# Patient Record
Sex: Female | Born: 1990 | Race: White | Hispanic: No | Marital: Single | Smoking: Never smoker
Health system: Southern US, Community
[De-identification: ages and names within clinical notes are randomized; demographics above are authoritative.]

## PROBLEM LIST (undated history)

## (undated) DIAGNOSIS — F909 Attention-deficit hyperactivity disorder, unspecified type: Secondary | ICD-10-CM

## (undated) DIAGNOSIS — U071 COVID-19: Secondary | ICD-10-CM

## (undated) DIAGNOSIS — R51 Headache: Secondary | ICD-10-CM

## (undated) DIAGNOSIS — F329 Major depressive disorder, single episode, unspecified: Secondary | ICD-10-CM

## (undated) DIAGNOSIS — T8859XA Other complications of anesthesia, initial encounter: Secondary | ICD-10-CM

## (undated) DIAGNOSIS — Z8489 Family history of other specified conditions: Secondary | ICD-10-CM

## (undated) DIAGNOSIS — R0600 Dyspnea, unspecified: Secondary | ICD-10-CM

## (undated) DIAGNOSIS — K602 Anal fissure, unspecified: Secondary | ICD-10-CM

## (undated) DIAGNOSIS — D649 Anemia, unspecified: Secondary | ICD-10-CM

## (undated) DIAGNOSIS — J189 Pneumonia, unspecified organism: Secondary | ICD-10-CM

## (undated) DIAGNOSIS — F32A Depression, unspecified: Secondary | ICD-10-CM

## (undated) DIAGNOSIS — T7840XA Allergy, unspecified, initial encounter: Secondary | ICD-10-CM

## (undated) DIAGNOSIS — K8020 Calculus of gallbladder without cholecystitis without obstruction: Secondary | ICD-10-CM

## (undated) DIAGNOSIS — R519 Headache, unspecified: Secondary | ICD-10-CM

## (undated) DIAGNOSIS — E669 Obesity, unspecified: Secondary | ICD-10-CM

## (undated) DIAGNOSIS — G709 Myoneural disorder, unspecified: Secondary | ICD-10-CM

## (undated) DIAGNOSIS — F419 Anxiety disorder, unspecified: Secondary | ICD-10-CM

## (undated) HISTORY — DX: Depression, unspecified: F32.A

## (undated) HISTORY — DX: Anal fissure, unspecified: K60.2

## (undated) HISTORY — DX: Headache, unspecified: R51.9

## (undated) HISTORY — DX: Obesity, unspecified: E66.9

## (undated) HISTORY — DX: Calculus of gallbladder without cholecystitis without obstruction: K80.20

## (undated) HISTORY — DX: Major depressive disorder, single episode, unspecified: F32.9

## (undated) HISTORY — PX: FRACTURE SURGERY: SHX138

## (undated) HISTORY — DX: Allergy, unspecified, initial encounter: T78.40XA

## (undated) HISTORY — PX: NO PAST SURGERIES: SHX2092

## (undated) HISTORY — DX: Anxiety disorder, unspecified: F41.9

## (undated) HISTORY — DX: Headache: R51

---

## 2012-05-12 DIAGNOSIS — L709 Acne, unspecified: Secondary | ICD-10-CM | POA: Insufficient documentation

## 2013-01-30 DIAGNOSIS — Z842 Family history of other diseases of the genitourinary system: Secondary | ICD-10-CM | POA: Insufficient documentation

## 2013-01-30 DIAGNOSIS — L659 Nonscarring hair loss, unspecified: Secondary | ICD-10-CM | POA: Insufficient documentation

## 2013-02-06 DIAGNOSIS — E669 Obesity, unspecified: Secondary | ICD-10-CM | POA: Insufficient documentation

## 2013-07-17 DIAGNOSIS — K649 Unspecified hemorrhoids: Secondary | ICD-10-CM | POA: Insufficient documentation

## 2013-07-17 DIAGNOSIS — L680 Hirsutism: Secondary | ICD-10-CM | POA: Insufficient documentation

## 2016-05-30 DIAGNOSIS — S59902A Unspecified injury of left elbow, initial encounter: Secondary | ICD-10-CM | POA: Insufficient documentation

## 2016-05-30 DIAGNOSIS — Y9389 Activity, other specified: Secondary | ICD-10-CM | POA: Insufficient documentation

## 2016-05-30 DIAGNOSIS — Y999 Unspecified external cause status: Secondary | ICD-10-CM | POA: Diagnosis not present

## 2016-05-30 DIAGNOSIS — X509XXA Other and unspecified overexertion or strenuous movements or postures, initial encounter: Secondary | ICD-10-CM | POA: Insufficient documentation

## 2016-05-30 DIAGNOSIS — Y92009 Unspecified place in unspecified non-institutional (private) residence as the place of occurrence of the external cause: Secondary | ICD-10-CM | POA: Insufficient documentation

## 2016-05-30 DIAGNOSIS — M25522 Pain in left elbow: Secondary | ICD-10-CM | POA: Diagnosis not present

## 2016-05-30 NOTE — ED Triage Notes (Signed)
Pt states that she was using her left arm to reach behind her and felt something pop, pt reports cont pain to the left elbow, pt is able to bend and straighten left arm

## 2016-05-31 ENCOUNTER — Emergency Department
Admission: EM | Admit: 2016-05-31 | Discharge: 2016-05-31 | Disposition: A | Discharge status: Home / Self Care | Payer: 59 | Attending: Emergency Medicine | Admitting: Emergency Medicine

## 2016-05-31 ENCOUNTER — Emergency Department: Payer: 59

## 2016-05-31 DIAGNOSIS — M25522 Pain in left elbow: Secondary | ICD-10-CM | POA: Diagnosis not present

## 2016-05-31 DIAGNOSIS — S59902A Unspecified injury of left elbow, initial encounter: Secondary | ICD-10-CM | POA: Diagnosis not present

## 2016-05-31 MED ORDER — HYDROCODONE-ACETAMINOPHEN 5-325 MG PO TABS: 1.0000 | ORAL_TABLET | Freq: Four times a day (QID) | ORAL | 0 refills | Status: DC | PRN

## 2016-05-31 MED ORDER — HYDROCODONE-ACETAMINOPHEN 5-325 MG PO TABS
1.0000 | ORAL_TABLET | Freq: Once | ORAL | Status: AC
  Administered 2016-05-31: 1 via ORAL
  Filled 2016-05-31: qty 1

## 2016-05-31 MED ORDER — IBUPROFEN 800 MG PO TABS
800.0000 mg | ORAL_TABLET | Freq: Once | ORAL | Status: AC
  Administered 2016-05-31: 800 mg via ORAL
  Filled 2016-05-31: qty 1

## 2016-05-31 MED ORDER — IBUPROFEN 800 MG PO TABS: 800.0000 mg | ORAL_TABLET | Freq: Three times a day (TID) | ORAL | 0 refills | Status: DC | PRN

## 2016-05-31 NOTE — ED Provider Notes (Signed)
[facility] Emergency Department Provider Note   ____________________________________________   First MD Initiated Contact with Patient 05/31/16 [phone]     (approximate)  I have reviewed the triage vital signs and the nursing notes.   HISTORY  Chief Complaint Arm Pain    HPI Amanda Wilson is a 26 y.o. female who presents to the ED from home with a chief complaint of nontraumatic left elbow pain.Patient was using her left, nondominant hand, to reach behind her for a pillow. Had to pull the pillow from underneath some heavy books and heard a pop. Complains of pain to her left elbow, especially on movement. Denies associated extremity weakness, numbness or tingling. Voices no other complaints.   Past medical history None  There are no active problems to display for this patient.   No past surgical history on file.  Prior to Admission medications   Medication Sig Start Date End Date Taking? Authorizing Provider  HYDROcodone-acetaminophen (NORCO) 5-325 MG tablet Take 1 tablet by mouth every 6 (six) hours as needed for moderate pain. 05/31/16   Paulette Blanch, MD  ibuprofen (ADVIL,MOTRIN) 800 MG tablet Take 1 tablet (800 mg total) by mouth every 8 (eight) hours as needed for moderate pain. 05/31/16   Paulette Blanch, MD    Allergies Duricef [cefadroxil] and Latex  No family history on file.  Social History Social History  Substance Use Topics  . Smoking status: Not on file  . Smokeless tobacco: Not on file  . Alcohol use Not on file  Nonsmoker  Review of Systems  Constitutional: No fever/chills. Eyes: No visual changes. ENT: No sore throat. Cardiovascular: Denies chest pain. Respiratory: Denies shortness of breath. Gastrointestinal: No abdominal pain.  No nausea, no vomiting.  No diarrhea.  No constipation. Genitourinary: Negative for dysuria. Musculoskeletal: Positive for left elbow pain. Negative for back pain. Skin: Negative for  rash. Neurological: Negative for headaches, focal weakness or numbness.   ____________________________________________   PHYSICAL EXAM:  VITAL SIGNS: ED Triage Vitals  Enc Vitals Group     BP 05/30/16 2304 127/61     Pulse Rate 05/30/16 2304 89     Resp 05/30/16 2304 18     Temp 05/30/16 2304 98 F (36.7 C)     Temp Source 05/30/16 2304 Oral     SpO2 05/30/16 2304 99 %     Weight 05/30/16 2304 296 lb (134.3 kg)     Height 05/30/16 2304 5\' 4"  (1.626 m)     Head Circumference --      Peak Flow --      Pain Score 05/30/16 2303 7     Pain Loc --      Pain Edu? --      Excl. in [place]? --     Constitutional: Alert and oriented. Well appearing and in no acute distress. Eyes: Conjunctivae are normal. PERRL. EOMI. Head: Atraumatic. Nose: No congestion/rhinnorhea. Mouth/Throat: Mucous membranes are moist.  Oropharynx non-erythematous. Neck: No stridor.   Cardiovascular: Normal rate, regular rhythm. Grossly normal heart sounds.  Good peripheral circulation. Respiratory: Normal respiratory effort.  No retractions. Lungs CTAB. Gastrointestinal: Soft and nontender. No distention. No abdominal bruits. No CVA tenderness. Musculoskeletal:  Left elbow held in adduction and internal rotation. Full range of motion with pain. 2+ radial pulses. Brisk, less than 5 second capillary refill. Neurologic:  Normal speech and language. No gross focal neurologic deficits are appreciated. No gait instability. Skin:  Skin is warm, dry and intact. No rash  noted. Psychiatric: Mood and affect are normal. Speech and behavior are normal.  ____________________________________________   LABS (all labs ordered are listed, but only abnormal results are displayed)  Labs Reviewed - No data to display ____________________________________________  EKG  None ____________________________________________  RADIOLOGY  Left elbow x-rays interpreted per Dr. Radene Knee: No evidence of fracture or  dislocation. ____________________________________________   PROCEDURES  Procedure(s) performed: None  Procedures  Critical Care performed: No  ____________________________________________   INITIAL IMPRESSION / ASSESSMENT AND PLAN / ED COURSE  Pertinent labs & imaging results that were available during my care of the patient were reviewed by me and considered in my medical decision making (see chart for details).  26 year old female who presents with hyperextension injury to left elbow. She does have full range of motion of elbow on exam. Will place in sling, NSAIDs, analgesia and follow-up with orthopedics next week. Strict return precautions given. Patient and mother verbalize understanding and agree with plan of care.      ____________________________________________   FINAL CLINICAL IMPRESSION(S) / ED DIAGNOSES  Final diagnoses:  Left elbow pain      NEW MEDICATIONS STARTED DURING THIS VISIT:  New Prescriptions   HYDROCODONE-ACETAMINOPHEN (NORCO) 5-325 MG TABLET    Take 1 tablet by mouth every 6 (six) hours as needed for moderate pain.   IBUPROFEN (ADVIL,MOTRIN) 800 MG TABLET    Take 1 tablet (800 mg total) by mouth every 8 (eight) hours as needed for moderate pain.     Note:  This document was prepared using Dragon voice recognition software and may include unintentional dictation errors.    Paulette Blanch, MD 05/31/16 [phone]

## 2016-05-31 NOTE — Discharge Instructions (Signed)
1. Wear sling as needed for left elbow comfort. You may remove to bathe and sleep. 2. You may take pain medicines as needed (Motrin/Norco #15). 3. Return to the ER for worsening symptoms, increased swelling, numbness/tingling or other concerns.

## 2016-06-22 ENCOUNTER — Encounter: Payer: Self-pay | Admitting: Nurse Practitioner

## 2016-06-22 ENCOUNTER — Ambulatory Visit (INDEPENDENT_AMBULATORY_CARE_PROVIDER_SITE_OTHER): Payer: 59 | Admitting: Nurse Practitioner

## 2016-06-22 VITALS — BP 109/72 | HR 80 | Temp 98.3°F | Resp 16 | Ht 62.75 in | Wt 295.0 lb

## 2016-06-22 DIAGNOSIS — N946 Dysmenorrhea, unspecified: Secondary | ICD-10-CM | POA: Diagnosis not present

## 2016-06-22 DIAGNOSIS — F329 Major depressive disorder, single episode, unspecified: Secondary | ICD-10-CM

## 2016-06-22 DIAGNOSIS — F32A Depression, unspecified: Secondary | ICD-10-CM

## 2016-06-22 DIAGNOSIS — Z30011 Encounter for initial prescription of contraceptive pills: Secondary | ICD-10-CM | POA: Diagnosis not present

## 2016-06-22 DIAGNOSIS — F419 Anxiety disorder, unspecified: Secondary | ICD-10-CM | POA: Diagnosis not present

## 2016-06-22 MED ORDER — NORETHINDRONE ACET-ETHINYL EST 1-20 MG-MCG PO TABS: 1.0000 | ORAL_TABLET | Freq: Every day | ORAL | 11 refills | Status: DC

## 2016-06-22 MED ORDER — VENLAFAXINE HCL 37.5 MG PO TABS: 37.5000 mg | ORAL_TABLET | Freq: Two times a day (BID) | ORAL | 1 refills | Status: DC

## 2016-06-22 NOTE — Patient Instructions (Signed)
Amanda Wilson, Thank you for coming in to clinic today.  1. For your anxiety and depression: - START venlafaxine 37.5 mg twice daily.  For the first week only take once daily.  Day 8, start your twice daily dosing.  2. For your heavy periods: - Start your pill on the first Sunday of your next period - Day 1 of Bleeding. - Allow 2 full cycles for reduction of symptoms.   Please schedule a follow-up appointment with [name], AGNP to Return in about 4 weeks (around 07/20/2016) for anxiety/depression.  If you have any other questions or concerns, please feel free to call the clinic or send a message through [facility]. You may also schedule an earlier appointment if necessary.  [name], DNP, AGNP-BC Adult Gerontology Nurse Practitioner [place]

## 2016-06-22 NOTE — Progress Notes (Signed)
Subjective:    Patient ID: Amanda Wilson, female    DOB: [birth year], 26 y.o.   MRN: [id number]  Amanda Wilson is a 26 y.o. female presenting on 06/22/2016 for New Patient (Initial Visit) (Patient here today to establish care, pt reports that she has not had a PCP in several years. Patient reports D/C Lexapro for anxiety/depression about 6 months ago. )   HPI  Depression When in [place] for Master's program, restarted on Lexapro 30 mg once daily.  Depression worse on medication and thought she was having hormone problems (GYN).  Was having daily headaches.  Began feeling so bad she was missing class, not getting out of bed.  She has since tapered off Lexapro and is not currently taking anything.  Lexapro was making her feel "loopy, spacey, and out of it."  She had taken lexapro since 26 yo and was regularly having to increase/decrease her dose.  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  When she resumes school, she plans to attend locally.  Is looking for psychiatrist.  Has called [facility] and is not getting through.  She has been seeing clinical psychologist Dr. Burt Knack in downtown [place] since February.  Not working, staying home and keeping busy at home with some home improvement projects, cooking for her family.   Her depression was worse once home, but is improving. Now anxiety is "worse than ever."  Last week 4 days in a row stopped eating completely.  "Head would not shut up" r/t racing thoughts.  Mother had to schedule this appointment for her.  She has panic attacks described as chest pain and "feeling like [she] would explode."  She has tried to find a job, but cannot r/t anxiety associated w/ starting work.  Last job she had to quit r/t nausea before starting shift.  Diet: has been doing weight watchers since home with her mother and brother and has lost 17 lbs. Exercise: daily 45 min walks  w/ her dog.  Contraception Management More regular periods. Previously q 6 months, now every 29-30 days. LMP 05/28/2016.   - Not currently sexually active.  She needs new OCP for irregular period history.  She states she has not been fully evaluated for PCOS, but other providers (including Endocrinology) had suggested it.  These providers were in [place] at [facility].  Depression screen PHQ 2/9 06/22/2016  Decreased Interest 3  Down, Depressed, Hopeless 3  PHQ - 2 Score 6  Altered sleeping 2  Tired, decreased energy 2  Change in appetite 1  Feeling bad or failure about yourself  2  Trouble concentrating 1  Moving slowly or fidgety/restless 0  Suicidal thoughts 1  PHQ-9 Score 15  Difficult doing work/chores Extremely dIfficult   GAD 7 : Generalized Anxiety Score 06/22/2016  Nervous, Anxious, on Edge 3  Control/stop worrying 3  Worry too much - different things 3  Trouble relaxing 2  Restless 1  Easily annoyed or irritable 1  Afraid - awful might happen 3  Total GAD 7 Score 16  Anxiety Difficulty Extremely difficult      Social History  Substance Use Topics  . Smoking status: Never Smoker  . Smokeless tobacco: Never Used  . Alcohol use 0.6 oz/week    1 Glasses of wine per week     Comment: 1 binge drinking episode per month w/ 5 drinks    Review of Systems  Constitutional:  Positive for appetite change.       Swings high/low appetite w/ moods  HENT: Negative.   Eyes: Negative.   Respiratory: Positive for chest tightness and shortness of breath.   Cardiovascular: Negative.   Gastrointestinal: Negative.   Endocrine: Negative.   Genitourinary: Positive for menstrual problem.  Musculoskeletal: Negative.   Skin: Negative.   Allergic/Immunologic: Negative.   Neurological: Positive for headaches.  Hematological: Negative.   Psychiatric/Behavioral: Negative for self-injury and suicidal ideas. The patient is nervous/anxious.    Per HPI unless specifically indicated above      Objective:    BP 109/72 (BP Location: Left Arm, Patient Position: Sitting, Cuff Size: Large)   Pulse 80   Temp 98.3 F (36.8 C) (Oral)   Resp 16   Ht 5' 2.75" (1.594 m)   Wt 295 lb (133.8 kg)   LMP 05/28/2016 (Exact Date)   SpO2 100%   BMI 52.67 kg/m   Wt Readings from Last 3 Encounters:  06/22/16 295 lb (133.8 kg)  05/30/16 296 lb (134.3 kg)    Physical Exam  Constitutional: She is oriented to person, place, and time. She appears well-developed and well-nourished. No distress.  obese  HENT:  Head: Normocephalic and atraumatic.  Neck: Normal range of motion. Neck supple. No JVD present. No tracheal deviation present. No thyromegaly present.  Cardiovascular: Normal rate, regular rhythm, normal heart sounds and intact distal pulses.   Pulmonary/Chest: Effort normal and breath sounds normal. No respiratory distress.  Abdominal: Soft. Bowel sounds are normal.  Lymphadenopathy:    She has no cervical adenopathy.  Neurological: She is alert and oriented to person, place, and time.  Skin: Skin is warm and dry.  Psychiatric: She has a normal mood and affect. Her behavior is normal. Judgment and thought content normal.  Vitals reviewed.  No results found for this or any previous visit.    Assessment & Plan:   Problem List Items Addressed This Visit      Genitourinary   Dysmenorrhea    Pt w/ history of dysmenorrhea.  Currently off OCP.  Previously used cyclic OCP.  Not currently sexually active and LMP 05/28/2016.  Plan: 1. Start norethindrone-ethinyl estradiol 1-20 mg-mcg take one active pill for 21 days, continue pack through day 28, then start new pack. 2. Consider evaluation of PCOS in future.      Relevant Medications   norethindrone-ethinyl estradiol (MICROGESTIN,JUNEL,LOESTRIN) 1-20 MG-MCG tablet     Other   Anxiety and depression - Primary    Improving depression, worsening anxiety. Pt with high health literacy level for mental health.  Connected with weekly  psychologic counseling.  Desires Psychiatry management.  Plan: 1. Referral to [facility]. 2. Start SNRI venlafaxine 37.5 mg once daily.  Prior SSRI Lexapro without complete remission. 3. Continue meaningful activities at home in absence of job. 4. Continue eating well and exercising regularly.   5. Follow up 4 weeks.      Relevant Medications   venlafaxine (EFFEXOR) 37.5 MG tablet   Other Relevant Orders   Ambulatory referral to Psychiatry    Other Visit Diagnoses    Encounter for initial prescription of contraceptive pills       Start OCP for dysmenorrhea and possible PCOS.  Plan: see dysmenorrhea      Meds ordered this encounter  Medications  . Multiple Vitamin (MULTIVITAMIN) tablet    Sig: Take 1 tablet by mouth daily.  Marland Kitchen venlafaxine (EFFEXOR) 37.5 MG tablet    Sig: Take 1 tablet (37.5 mg  total) by mouth 2 (two) times daily with a meal.    Dispense:  60 tablet    Refill:  1    Order Specific Question:   Supervising Provider    Answer:   Olin Hauser [[id number]]  . norethindrone-ethinyl estradiol (MICROGESTIN,JUNEL,LOESTRIN) 1-20 MG-MCG tablet    Sig: Take 1 tablet by mouth daily.    Dispense:  1 Package    Refill:  11    Order Specific Question:   Supervising Provider    Answer:   Olin Hauser [[id number]]      Follow up plan: Return in about 4 weeks (around 07/20/2016) for anxiety/depression.   [name], DNP, AGPCNP-BC Adult Gerontology Primary Care Nurse Practitioner [facility] 06/24/2016, 9:06 PM

## 2016-06-24 ENCOUNTER — Encounter: Payer: Self-pay | Admitting: Nurse Practitioner

## 2016-06-24 DIAGNOSIS — F32A Depression, unspecified: Secondary | ICD-10-CM | POA: Insufficient documentation

## 2016-06-24 DIAGNOSIS — F419 Anxiety disorder, unspecified: Principal | ICD-10-CM

## 2016-06-24 DIAGNOSIS — N946 Dysmenorrhea, unspecified: Secondary | ICD-10-CM | POA: Insufficient documentation

## 2016-06-24 DIAGNOSIS — F329 Major depressive disorder, single episode, unspecified: Secondary | ICD-10-CM | POA: Insufficient documentation

## 2016-06-24 NOTE — Progress Notes (Signed)
I have reviewed this encounter including the documentation in this note and/or discussed this patient with the provider, [name], AGPCNP-BC. I am certifying that I agree with the content of this note as supervising physician.  Nobie Putnam, [facility] 06/24/2016, 9:46 PM

## 2016-06-24 NOTE — Assessment & Plan Note (Addendum)
Pt w/ history of dysmenorrhea.  Currently off OCP.  Previously used cyclic OCP.  Not currently sexually active and LMP 05/28/2016.  Plan: 1. Start norethindrone-ethinyl estradiol 1-20 mg-mcg take one active pill for 21 days, continue pack through day 28, then start new pack. 2. Consider evaluation of PCOS in future.

## 2016-06-24 NOTE — Assessment & Plan Note (Signed)
Improving depression, worsening anxiety. Pt with high health literacy level for mental health.  Connected with weekly psychologic counseling.  Desires Psychiatry management.  Plan: 1. Referral to [facility]. 2. Start SNRI venlafaxine 37.5 mg once daily.  Prior SSRI Lexapro without complete remission. 3. Continue meaningful activities at home in absence of job. 4. Continue eating well and exercising regularly.   5. Follow up 4 weeks.

## 2016-07-16 ENCOUNTER — Ambulatory Visit: Payer: 59 | Admitting: Psychiatry

## 2016-07-29 ENCOUNTER — Ambulatory Visit (INDEPENDENT_AMBULATORY_CARE_PROVIDER_SITE_OTHER): Payer: 59 | Admitting: Nurse Practitioner

## 2016-07-29 DIAGNOSIS — F329 Major depressive disorder, single episode, unspecified: Secondary | ICD-10-CM

## 2016-07-29 DIAGNOSIS — F419 Anxiety disorder, unspecified: Secondary | ICD-10-CM | POA: Diagnosis not present

## 2016-07-29 DIAGNOSIS — F32A Depression, unspecified: Secondary | ICD-10-CM

## 2016-07-29 MED ORDER — VENLAFAXINE HCL 50 MG PO TABS: 50.0000 mg | ORAL_TABLET | Freq: Two times a day (BID) | ORAL | 0 refills | Status: DC

## 2016-07-29 MED ORDER — BUSPIRONE HCL 5 MG PO TABS: 5.0000 mg | ORAL_TABLET | Freq: Two times a day (BID) | ORAL | 1 refills | Status: DC

## 2016-07-29 NOTE — Progress Notes (Signed)
Subjective:    Patient ID: Amanda Wilson, female    DOB: Oct 26, 1990, 26 y.o.   MRN: [id number]  Amanda Wilson is a 26 y.o. female presenting on 07/29/2016 for Anxiety   HPI  Anxiety and depression Has had less depressive symptoms (sleepiness improved, less anhedonia, has resumed painting w/ intention to try, has started applying for jobs.  Having more energy and is ready to start doing things.  Family is now noticing improved mood. Still having high anxiety and full panic attacks (can't breathe, gagging, chest pain).    Prescribed Effexor 37.5 mg bid.  Pt is taking Effexor 37.5 mg each evening, but only taking daytime dose 3x per week 2/2 no appetite and nausea if taken w/o food.  No nausea when taking after large evening meal. Pt is using weight watchers for weight loss.  Now only eating one meal per day. - Usually 25 points at that meal. Sometimes will get small snack in am and still will have nausea in am dose.  Cut back on drinking - to almost none, but notes two occurrences of 3-6 drinks per occasion.  On one occurrence over 6 hours had 3  24 oz beers w/ drunkenness when she "wouldn't have normally been drunk or hungover."  Next day had nausea w/ vomiting q 30 minutes.   Nearly passed out. Had never had this reaction prior (nausea, vomiting, tremor for at least 15 hours after drinking).  C/w serotonin syndrome.  Normally withdrawn mood when depressed, but now that depression is improving and she is not withdrawing from family, she notices she will get angry easily.  Has scheduled w/ psychiatry, but has not had her first appointment.  GAD 7 : Generalized Anxiety Score 07/29/2016 06/22/2016  Nervous, Anxious, on Edge 2 3  Control/stop worrying 2 3  Worry too much - different things 3 3  Trouble relaxing 1 2  Restless 2 1  Easily annoyed or irritable 2 1  Afraid - awful might happen 2 3  Total GAD 7 Score 14 16  Anxiety Difficulty Extremely difficult Extremely difficult     Depression screen [facility] 2/9 07/29/2016 06/22/2016  Decreased Interest 1 3  Down, Depressed, Hopeless 1 3  PHQ - 2 Score 2 6  Altered sleeping 2 2  Tired, decreased energy 1 2  Change in appetite 2 1  Feeling bad or failure about yourself  1 2  Trouble concentrating 0 1  Moving slowly or fidgety/restless 0 0  Suicidal thoughts 0 1  PHQ-9 Score 8 15  Difficult doing work/chores - Extremely dIfficult     Social History  Substance Use Topics  . Smoking status: Never Smoker  . Smokeless tobacco: Never Used  . Alcohol use 0.6 oz/week    1 Glasses of wine per week     Comment: 1 binge drinking episode per month w/ 5 drinks    Review of Systems Per HPI unless specifically indicated above     Objective:    BP 136/80 (BP Location: Left Arm, Patient Position: Sitting, Cuff Size: Large)   Pulse 89   Temp 98.6 F (37 C) (Oral)   Resp 16   Ht 5\' 3"  (1.6 m)   Wt 282 lb 9.6 oz (128.2 kg)   LMP 07/19/2016 (Exact Date)   SpO2 99%   BMI 50.06 kg/m    Wt Readings from Last 3 Encounters:  07/29/16 282 lb 9.6 oz (128.2 kg)  06/22/16 295 lb (133.8 kg)  05/30/16 296 lb (134.3 kg)  Physical Exam  General - obese, well-appearing, NAD HEENT - Normocephalic, atraumatic Neck - supple, non-tender, no LAD Heart - RRR, bradycardia, no murmurs heard Lungs - Clear throughout all lobes, no wheezing, crackles, or rhonchi. Normal work of breathing. Abdomen - soft, NTND, no masses, no hepatosplenomegaly, active bowel sounds Extremeties - non-tender, no edema, cap refill < 2 seconds, peripheral pulses intact +2 bilaterally Skin - warm, dry, no rashes Neuro - awake, alert, oriented x3, intact muscle strength 5/5 bilaterally, intact distal sensation to light touch, normal coordination, normal gait Psych - Normal mood and affect, normal behavior     No results found for this or any previous visit.    Assessment & Plan:   Problem List Items Addressed This Visit      Other   Anxiety and  depression    Improved depression w/ pt noticing some improvement as well.  Only slightly improved anxiety w/ improvement of 2 points on GAD7 tool. Pt with high health literacy level for mental health.  Connected with weekly psychologic counseling.  Pt has gotten her psychiatry appointment scheduled, but has not had it yet.  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    Plan: 1. Referral to [facility]. 2. Change dose of SNRI venlafaxine from 37.5 mg bid to 50 mg once daily.   3. START buspirone 5 mg bid.  Can increase dose at future appointments as needed.  4.Continue meaningful activities at home in absence of job.  Continue work toward finding employment. 5. Continue eating well and exercising regularly.   6. Reviewed s/sx of serotonin syndrome.  Advised pt to avoid all alcohol use.  7. Follow up 4 weeks.      Relevant Medications   venlafaxine (EFFEXOR) 50 MG tablet   Other Relevant Orders   EKG 12-Lead      Meds ordered this encounter  Medications  . busPIRone (BUSPAR) 5 MG tablet    Sig: Take 1 tablet (5 mg total) by mouth 2 (two) times daily.    Dispense:  60 tablet    Refill:  1    Order Specific Question:   Supervising Provider    Answer:   Olin Hauser [[id number]]  . venlafaxine (EFFEXOR) 50 MG tablet    Sig: Take 1 tablet (50 mg total) by mouth daily.    Dispense:  30 tablet    Refill:  0    Order Specific Question:   Supervising Provider    Answer:   Olin Hauser [[id number]]      Follow up plan: Return in about 4 weeks (around 08/26/2016) for anxiety and depression.  [name], DNP, AGPCNP-BC Adult Gerontology Primary Care Nurse Practitioner [facility] 08/01/2016, 10:05 PM

## 2016-07-29 NOTE — Patient Instructions (Addendum)
Amanda Wilson, Thank you for coming in to clinic today.  1. For your anxiety: - START buspirone 5 mg once daily x 1 week.  Increase to 5 mg twice daily after 1 week. After 2-3 weeks if you feel you need a higher dose, call clinic.   2. For your anxiety and depression: - Change your Effexor to 50 mg once daily.  Take one tablet of your new prescription once per day.  Until you get the new bottle, take 1 and 1/2 pills once per day.   - AVOID ALL ALCOHOL.  Please schedule a follow-up appointment with [name], AGNP to Return in about 4 weeks (around 08/26/2016) for anxiety and depression.  If you have any other questions or concerns, please feel free to call the clinic or send a message through [facility]. You may also schedule an earlier appointment if necessary.  [name], DNP, AGNP-BC Adult Gerontology Nurse Practitioner [place]

## 2016-08-01 MED ORDER — VENLAFAXINE HCL 50 MG PO TABS: 50.0000 mg | ORAL_TABLET | Freq: Every day | ORAL | 0 refills | Status: DC

## 2016-08-01 NOTE — Assessment & Plan Note (Signed)
Improved depression w/ pt noticing some improvement as well.  Only slightly improved anxiety w/ improvement of 2 points on GAD7 tool. Pt with high health literacy level for mental health.  Connected with weekly psychologic counseling.  Pt has gotten her psychiatry appointment scheduled, but has not had it yet.  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    Plan: 1. Referral to [facility]. 2. Change dose of SNRI venlafaxine from 37.5 mg bid to 50 mg once daily.   3. START buspirone 5 mg bid.  Can increase dose at future appointments as needed.  4.Continue meaningful activities at home in absence of job.  Continue work toward finding employment. 5. Continue eating well and exercising regularly.   6. Reviewed s/sx of serotonin syndrome.  Advised pt to avoid all alcohol use.  7. Follow up 4 weeks.

## 2016-08-02 NOTE — Progress Notes (Signed)
I have reviewed this encounter including the documentation in this note and/or discussed this patient with the provider, [name], AGPCNP-BC. I am certifying that I agree with the content of this note as supervising physician.  Nobie Putnam, [facility] 08/02/2016, 5:17 PM

## 2016-08-21 ENCOUNTER — Other Ambulatory Visit: Payer: Self-pay | Admitting: Nurse Practitioner

## 2016-08-21 DIAGNOSIS — F419 Anxiety disorder, unspecified: Principal | ICD-10-CM

## 2016-08-21 DIAGNOSIS — F32A Depression, unspecified: Secondary | ICD-10-CM

## 2016-08-21 DIAGNOSIS — F329 Major depressive disorder, single episode, unspecified: Secondary | ICD-10-CM

## 2016-08-26 ENCOUNTER — Ambulatory Visit: Payer: Self-pay | Admitting: Nurse Practitioner

## 2016-08-31 ENCOUNTER — Encounter: Payer: Self-pay | Admitting: Nurse Practitioner

## 2016-08-31 ENCOUNTER — Ambulatory Visit (INDEPENDENT_AMBULATORY_CARE_PROVIDER_SITE_OTHER): Payer: 59 | Admitting: Nurse Practitioner

## 2016-08-31 DIAGNOSIS — F329 Major depressive disorder, single episode, unspecified: Secondary | ICD-10-CM | POA: Diagnosis not present

## 2016-08-31 DIAGNOSIS — F419 Anxiety disorder, unspecified: Secondary | ICD-10-CM

## 2016-08-31 DIAGNOSIS — F32A Depression, unspecified: Secondary | ICD-10-CM

## 2016-08-31 MED ORDER — VENLAFAXINE HCL 25 MG PO TABS: 25.0000 mg | ORAL_TABLET | Freq: Two times a day (BID) | ORAL | 5 refills | Status: DC

## 2016-08-31 MED ORDER — BUSPIRONE HCL 7.5 MG PO TABS: 7.5000 mg | ORAL_TABLET | Freq: Two times a day (BID) | ORAL | 5 refills | Status: DC

## 2016-08-31 NOTE — Progress Notes (Signed)
Subjective:    Patient ID: Amanda Wilson, female    DOB: 11-03-1990, 26 y.o.   MRN: [id number]  Amanda Wilson is a 26 y.o. female presenting on 08/31/2016 for Anxiety   HPI Anxiety Feels like she is "spiraling out of control." Has really tried to be healthy, but has had recent relapses in diet and physical activity.  Finds herself sleeping a lot - avoidance and fatigue.Struggling w/ relationship stress "toxic."  States she cares too much and doesn't want to distance.  Mood plummets when around him.  Feels out of control over last 2-3 weeks.  Long term 3 years of off and on relationship.  Has trouble coping w/ romantic/friend relationship and notes this is r/t more situational difficulty. - has attempted stopping communication completely - has attempted setting boundaries Feels failure in these attempts because of increased anxiety after attempting to end this relationship.  Has a counselor - Would like to see psychiatrist.  Counseling - Dr. Phoebe Perch, Ph.D. - had been seeing him before going to [place]: Pt feels she has no true social support for this issue despite having good family support, good friends.   - No current spiritual/faith-based support system.  Has previously had active [employer] faith support system.  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  GAD 7 : Generalized Anxiety Score 08/31/2016 07/29/2016 06/22/2016  Nervous, Anxious, on Edge 1 2 3   Control/stop worrying 1 2 3   Worry too much - different things 2 3 3   Trouble relaxing 1 1 2   Restless 0 2 1  Easily annoyed or irritable 1 2 1   Afraid - awful might happen 2 2 3   Total GAD 7 Score 8 14 16   Anxiety Difficulty Somewhat difficult Extremely difficult Extremely difficult    Depression screen [facility] 2/9 08/31/2016 07/29/2016 06/22/2016  Decreased Interest 1 1 3   Down, Depressed, Hopeless 2 1 3   PHQ - 2 Score 3 2 6   Altered sleeping 2 2 2   Tired,  decreased energy 1 1 2   Change in appetite 0 2 1  Feeling bad or failure about yourself  2 1 2   Trouble concentrating 0 0 1  Moving slowly or fidgety/restless 0 0 0  Suicidal thoughts 2 0 1  PHQ-9 Score 10 8 15   Difficult doing work/chores Very difficult - Extremely dIfficult    Social History  Substance Use Topics  . Smoking status: Never Smoker  . Smokeless tobacco: Never Used  . Alcohol use 0.6 oz/week    1 Glasses of wine per week     Comment: 1 binge drinking episode per month w/ 5 drinks    Review of Systems Per HPI unless specifically indicated above     Objective:    BP 114/61 (BP Location: Right Arm, Patient Position: Sitting, Cuff Size: Large)   Pulse 76   Temp 98.4 F (36.9 C) (Oral)   Ht 5\' 4"  (1.626 m)   Wt 278 lb 12.8 oz (126.5 kg)   BMI 47.86 kg/m   Wt Readings from Last 3 Encounters:  08/31/16 278 lb 12.8 oz (126.5 kg)  07/29/16 282 lb 9.6 oz (128.2 kg)  06/22/16 295 lb (133.8 kg)    Physical Exam  General - morbidly obese, well-appearing, NAD HEENT - Normocephalic, atraumatic Neck - supple, non-tender, no LAD Heart - RRR, no murmurs heard Lungs - Clear throughout all lobes, no wheezing, crackles, or rhonchi. Normal work of breathing. Extremeties -  non-tender, no edema, cap refill < 2 seconds, peripheral pulses intact +2 bilaterally Skin - warm, dry Neuro - awake, alert, oriented x3, normal gait Psych - anxious mood and affect, tearfulness occasionally during visit.  Otherwise normal behavior with appropriate conversation and eye contact.    No results found for this or any previous visit.    Assessment & Plan:   Problem List Items Addressed This Visit      Other   Anxiety and depression    Continued improvement overall w/ reduced GAD7 score after starting buspirone.  Situational depression worsened r/t relationship stress.  Pt continues w/ counseling but is finding the relationship less therapeutic.  Still desires psychiatry.  Improved  venlafaxine tolerability with 50 mg once daily.    Plan: 1. Referral to [facility].  Consider counseling through this office. Can provide alternative resources if needed for changing counselor in future. 2. Change dose of SNRI venlafaxine from 25 mg bid.  If return of nausea, return to 50 mg once daily dosing. 3. INCREASE buspirone to 7.5 mg bid.  Can increase dose at future appointments as needed.  4. Continue meaningful activities at home.  Encouraged keeping employment.  Encouraged utilizing all support systems of family, friends, faith-based. 5. Continue eating well and exercising regularly.   6. Follow up 4 weeks.      Relevant Medications   venlafaxine (EFFEXOR) 25 MG tablet   Other Relevant Orders   Ambulatory referral to Psychiatry      Meds ordered this encounter  Medications  . venlafaxine (EFFEXOR) 25 MG tablet    Sig: Take 1 tablet (25 mg total) by mouth 2 (two) times daily with a meal.    Dispense:  60 tablet    Refill:  5    Order Specific Question:   Supervising Provider    Answer:   Olin Hauser [[id number]]  . busPIRone (BUSPAR) 7.5 MG tablet    Sig: Take 1 tablet (7.5 mg total) by mouth 2 (two) times daily.    Dispense:  60 tablet    Refill:  5    Order Specific Question:   Supervising Provider    Answer:   Olin Hauser [[id number]]      Follow up plan: Return in about 4 weeks (around 09/28/2016) for anxiety and depression.  A total of 40 minutes was spent face-to-face with this patient. Greater than 50% of this time was spent in counseling and coordination of care with the patient.  [name], DNP, AGPCNP-BC Adult Gerontology Primary Care Nurse Practitioner [facility] 08/31/2016, 5:09 PM

## 2016-08-31 NOTE — Patient Instructions (Addendum)
Amanda Wilson, Thank you for coming in to clinic today.  1. Psychiatry: [facility] [place] [name], [place] [address] Phone: [phone]   2. Encourage continued family and friends support. - Work on setting and keeping boundaries.  Find a different source for fulfillment to move on from your negative situation. - Encourage faith/religion support - community or independent engagement with your faith will provide another support.   Please schedule a follow-up appointment with [name], AGNP. Return in about 4 weeks (around 09/28/2016) for anxiety and depression.    If you have any other questions or concerns, please feel free to call the clinic or send a message through [place]. You may also schedule an earlier appointment if necessary.  You will receive a survey after today's visit either digitally by e-mail or paper by [employer]. Your experiences and feedback matter to [place].  Please respond so we know how we are doing as we provide care for you.   [name], DNP, AGNP-BC Adult Gerontology Nurse Practitioner [place]

## 2016-09-01 NOTE — Progress Notes (Signed)
I have reviewed this encounter including the documentation in this note and/or discussed this patient with the provider, [name], AGPCNP-BC. I am certifying that I agree with the content of this note as supervising physician.  Nobie Putnam, [facility] 09/01/2016, 12:30 PM

## 2016-09-01 NOTE — Assessment & Plan Note (Signed)
Continued improvement overall w/ reduced GAD7 score after starting buspirone.  Situational depression worsened r/t relationship stress.  Pt continues w/ counseling but is finding the relationship less therapeutic.  Still desires psychiatry.  Improved venlafaxine tolerability with 50 mg once daily.    Plan: 1. Referral to [facility].  Consider counseling through this office. Can provide alternative resources if needed for changing counselor in future. 2. Change dose of SNRI venlafaxine from 25 mg bid.  If return of nausea, return to 50 mg once daily dosing. 3. INCREASE buspirone to 7.5 mg bid.  Can increase dose at future appointments as needed.  4. Continue meaningful activities at home.  Encouraged keeping employment.  Encouraged utilizing all support systems of family, friends, faith-based. 5. Continue eating well and exercising regularly.   6. Follow up 4 weeks.

## 2016-09-23 ENCOUNTER — Ambulatory Visit (INDEPENDENT_AMBULATORY_CARE_PROVIDER_SITE_OTHER): Payer: 59 | Admitting: Psychiatry

## 2016-09-23 ENCOUNTER — Encounter (HOSPITAL_COMMUNITY): Payer: Self-pay | Admitting: Psychiatry

## 2016-09-23 VITALS — BP 140/80 | HR 81 | Ht 64.0 in | Wt 280.0 lb

## 2016-09-23 DIAGNOSIS — Z810 Family history of intellectual disabilities: Secondary | ICD-10-CM | POA: Diagnosis not present

## 2016-09-23 DIAGNOSIS — F332 Major depressive disorder, recurrent severe without psychotic features: Secondary | ICD-10-CM

## 2016-09-23 DIAGNOSIS — Z818 Family history of other mental and behavioral disorders: Secondary | ICD-10-CM | POA: Diagnosis not present

## 2016-09-23 DIAGNOSIS — Z658 Other specified problems related to psychosocial circumstances: Secondary | ICD-10-CM | POA: Diagnosis not present

## 2016-09-23 DIAGNOSIS — Z739 Problem related to life management difficulty, unspecified: Secondary | ICD-10-CM

## 2016-09-23 MED ORDER — FLUOXETINE HCL 20 MG PO CAPS: 20.0000 mg | ORAL_CAPSULE | Freq: Every day | ORAL | 1 refills | Status: DC

## 2016-09-23 MED ORDER — HYDROXYZINE PAMOATE 25 MG PO CAPS: 25.0000 mg | ORAL_CAPSULE | Freq: Three times a day (TID) | ORAL | 1 refills | Status: DC | PRN

## 2016-09-23 NOTE — Patient Instructions (Signed)
START prozac 20 mg daily, increase to 40 mg in 2 week  Use Vistaril 1-2 tablets for anxiety or sleep   STOP effexor and buspar
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
## 2016-09-28 ENCOUNTER — Encounter: Payer: Self-pay | Admitting: Nurse Practitioner

## 2016-09-28 ENCOUNTER — Ambulatory Visit (INDEPENDENT_AMBULATORY_CARE_PROVIDER_SITE_OTHER): Payer: 59 | Admitting: Nurse Practitioner

## 2016-09-28 VITALS — BP 111/56 | HR 70 | Temp 97.4°F | Ht 64.0 in | Wt 282.0 lb

## 2016-09-28 DIAGNOSIS — F329 Major depressive disorder, single episode, unspecified: Secondary | ICD-10-CM

## 2016-09-28 DIAGNOSIS — F419 Anxiety disorder, unspecified: Secondary | ICD-10-CM | POA: Diagnosis not present

## 2016-09-28 DIAGNOSIS — F32A Depression, unspecified: Secondary | ICD-10-CM

## 2016-09-28 NOTE — Patient Instructions (Signed)
Britton, Thank you for coming in to clinic today.  1. Continue management w/ psychiatry.  Please schedule a follow-up appointment with [name], AGNP. Return in about 3 months (around 12/28/2016) for anxiety and depression - can move to 6 months if continues w/ psychiatry.  If you have any other questions or concerns, please feel free to call the clinic or send a message through [place]. You may also schedule an earlier appointment if necessary.  You will receive a survey after today's visit either digitally by e-mail or paper by [employer]. Your experiences and feedback matter to [place].  Please respond so we know how we are doing as we provide care for you.   [name], DNP, AGNP-BC Adult Gerontology Nurse Practitioner [place]

## 2016-09-28 NOTE — Assessment & Plan Note (Addendum)
Improving. Pt well connected now w/ psychiatry.  New medications (Prozac and Hydroxyzine) per psychiatry seem to be improving symptoms after 2 weeks.  Pt hopeful for better control after 6-8 weeks.   Plan: 1. Continue management w/ [facility] for medications and counseling. 2. Continue meaningful activities at home.  Encouraged keeping employment.  Encouraged utilizing all support systems of family, friends, faith-based. 3. Continue eating well and exercising regularly.   4. Follow up 3 months or 6 months if not needed in 3 months because psychiatry relationship well established. Pt verbalizes understanding.

## 2016-09-28 NOTE — Progress Notes (Signed)
Subjective:    Patient ID: Amanda Wilson, female    DOB: 1990/05/19, 26 y.o.   MRN: [id number]  Amanda Wilson is a 26 y.o. female presenting on 09/28/2016 for Anxiety (pt was seen by psychiatrist x 5 days.  pt notice shes been irritable and angry lately x 1 week.)   HPI Anxiety and Depression Has had less anxiety and depression in recent last 2 weeks. Persistently angry and irritable.  Lashing out w/ family some.   Insomnia:  Still has vivid dreams.  Now has most difficulty w/ anticipation of the next day's responsibilities. Early am appointment prevents good sleep.   Per Psychiatry visit 2 weeks ago:  - Is now taking hydroxyzine and prozac.   - Encouraged intensive outpatient treatment, but is not affordable. - Is scheduled for their counselors.  Is still seeing prior counselor until new relationship is established.    Exercise: Is doing Zumba 2x per week.   Diet: Has not been doing her weight watcher's diet.  Tendency remains for binge eating. Support: Good family support.  More friend/social support.  Is increasing interactions w/ friends.  Work:  New employer continues pushing back job start date.  Inspections this week.  Still excited about opportunity.   Social History  Substance Use Topics  . Smoking status: Never Smoker  . Smokeless tobacco: Never Used  . Alcohol use Yes     Comment: Occasional glass of wine    Review of Systems Per HPI unless specifically indicated above     Objective:    BP (!) 111/56 (BP Location: Right Arm, Patient Position: Sitting, Cuff Size: Large)   Pulse 70   Temp (!) 97.4 F (36.3 C) (Oral)   Ht 5\' 4"  (1.626 m)   Wt 282 lb (127.9 kg)   BMI 48.41 kg/m   Wt Readings from Last 3 Encounters:  09/28/16 282 lb (127.9 kg)  08/31/16 278 lb 12.8 oz (126.5 kg)  07/29/16 282 lb 9.6 oz (128.2 kg)    Physical Exam  General - morbidly obese, well-appearing, NAD HEENT - Normocephalic, atraumatic Heart - RRR, no murmurs heard Lungs -  Clear throughout all lobes, no wheezing, crackles, or rhonchi. Normal work of breathing. Extremeties - non-tender, no edema, cap refill < 2 seconds, peripheral pulses intact +2 bilaterally Skin - warm, dry Neuro - awake, alert, oriented x3, normal gait, no tremor Psych - Normal mood and affect, normal behavior. No tearfulness.  Good conversational abilities.      Assessment & Plan:   Problem List Items Addressed This Visit      Other   Anxiety and depression - Primary    Improving. Pt well connected now w/ psychiatry.  New medications (Prozac and Hydroxyzine) per psychiatry seem to be improving symptoms after 2 weeks.  Pt hopeful for better control after 6-8 weeks.   Plan: 1. Continue management w/ [facility] for medications and counseling. 2. Continue meaningful activities at home.  Encouraged keeping employment.  Encouraged utilizing all support systems of family, friends, faith-based. 3. Continue eating well and exercising regularly.   4. Follow up 3 months or 6 months if not needed in 3 months because psychiatry relationship well established. Pt verbalizes understanding.          Follow up plan: Return in about 3 months (around 12/28/2016) for anxiety and depression - can move to 6 months if continues w/ psychiatry.  [name], DNP, AGPCNP-BC Adult Gerontology Primary Care Nurse Practitioner [facility] 09/28/2016, 9:25 AM

## 2016-09-28 NOTE — Progress Notes (Signed)
I have reviewed this encounter including the documentation in this note and/or discussed this patient with the provider, [name], AGPCNP-BC. I am certifying that I agree with the content of this note as supervising physician.  Nobie Putnam, [facility] 09/28/2016, 4:58 PM

## 2016-10-14 ENCOUNTER — Ambulatory Visit (INDEPENDENT_AMBULATORY_CARE_PROVIDER_SITE_OTHER): Payer: 59 | Admitting: Licensed Clinical Social Worker

## 2016-10-14 ENCOUNTER — Encounter (HOSPITAL_COMMUNITY): Payer: Self-pay | Admitting: Licensed Clinical Social Worker

## 2016-10-14 DIAGNOSIS — F332 Major depressive disorder, recurrent severe without psychotic features: Secondary | ICD-10-CM
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
## 2016-10-25 ENCOUNTER — Ambulatory Visit (INDEPENDENT_AMBULATORY_CARE_PROVIDER_SITE_OTHER): Payer: 59 | Admitting: Licensed Clinical Social Worker

## 2016-10-25 DIAGNOSIS — F332 Major depressive disorder, recurrent severe without psychotic features: Secondary | ICD-10-CM | POA: Diagnosis not present

## 2016-10-26 ENCOUNTER — Encounter (HOSPITAL_COMMUNITY): Payer: Self-pay | Admitting: Licensed Clinical Social Worker
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
## 2016-10-28 ENCOUNTER — Encounter: Payer: Self-pay | Admitting: Nurse Practitioner

## 2016-10-28 ENCOUNTER — Ambulatory Visit (INDEPENDENT_AMBULATORY_CARE_PROVIDER_SITE_OTHER): Payer: 59 | Admitting: Nurse Practitioner

## 2016-10-28 VITALS — BP 126/64 | HR 76 | Temp 98.3°F | Ht 64.0 in | Wt 279.2 lb

## 2016-10-28 DIAGNOSIS — F329 Major depressive disorder, single episode, unspecified: Secondary | ICD-10-CM | POA: Diagnosis not present

## 2016-10-28 DIAGNOSIS — F419 Anxiety disorder, unspecified: Secondary | ICD-10-CM

## 2016-10-28 DIAGNOSIS — F32A Depression, unspecified: Secondary | ICD-10-CM

## 2016-10-28 DIAGNOSIS — L03211 Cellulitis of face: Secondary | ICD-10-CM

## 2016-10-28 MED ORDER — AMOXICILLIN-POT CLAVULANATE 875-125 MG PO TABS: 1.0000 | ORAL_TABLET | Freq: Two times a day (BID) | ORAL | 0 refills | Status: AC

## 2016-10-28 MED ORDER — DOXYCYCLINE HYCLATE 100 MG PO TABS: 100.0000 mg | ORAL_TABLET | Freq: Two times a day (BID) | ORAL | 0 refills | Status: DC

## 2016-10-28 NOTE — Assessment & Plan Note (Signed)
Continues to improve on Prozac and Hydroxyzine per psychiatry.  Therapeutic relationship w/ psychology also providing pt opportunity for non-pharm management of symptoms.  Plan: 1. Continue management w/ [facility] for medications and counseling. 2. Continue meaningful activities at home and work.   3. Continue eating well and exercising regularly.   4. Follow up as needed.

## 2016-10-28 NOTE — Patient Instructions (Addendum)
Amanda Wilson, Thank you for coming in to clinic today.  1. You have cellulitis of your nose. - START doxycycline 100 mg twice daily x 10 days. - START augmentin 875-125mg  twice daily x 10 days. - Warm compress (clean washcloth) leave in place for about 5-10 minutes 2 x per day as needed for swelling.  - Continue acetaminophen 1,000 mg 3 x per day as needed for aches and fever.    2. Continue all your great work with your new counselor and pyschiatrist.  Please schedule a follow-up appointment with [name], [name]. Return in about 2 months (around 12/28/2016) for annual physical instead of anxiety and depression.  If you have any other questions or concerns, please feel free to call the clinic or send a message through [facility]. You may also schedule an earlier appointment if necessary.  You will receive a survey after today's visit either digitally by e-mail or paper by [employer]. Your experiences and feedback matter to [place].  Please respond so we know how we are doing as we provide care for you.   [name], DNP, AGNP-BC Adult Gerontology Nurse Practitioner [facility], [facility]    Cellulitis, Adult Cellulitis is a skin infection. The infected area is usually red and tender. This condition occurs most often in the arms and lower legs. The infection can travel to the muscles, blood, and underlying tissue and become serious. It is very important to get treated for this condition. What are the causes? Cellulitis is caused by bacteria. The bacteria enter through a break in the skin, such as a cut, burn, insect bite, open sore, or crack. What increases the risk? This condition is more likely to occur in people who:  Have a weak defense system (immune system).  Have open wounds on the skin such as cuts, burns, bites, and scrapes. Bacteria can enter the body through these open wounds.  Are older.  Have diabetes.  Have a type of long-lasting (chronic) liver disease  (cirrhosis) or kidney disease.  Use IV drugs.  What are the signs or symptoms? Symptoms of this condition include:  Redness, streaking, or spotting on the skin.  Swollen area of the skin.  Tenderness or pain when an area of the skin is touched.  Warm skin.  Fever.  Chills.  Blisters.  How is this diagnosed? This condition is diagnosed based on a medical history and physical exam. You may also have tests, including:  Blood tests.  Lab tests.  Imaging tests.  How is this treated? Treatment for this condition may include:  Medicines, such as antibiotic medicines or antihistamines.  Supportive care, such as rest and application of cold or warm cloths (cold or warm compresses) to the skin.  Hospital care, if the condition is severe.  The infection usually gets better within 1-2 days of treatment. Follow these instructions at home:  Take over-the-counter and prescription medicines only as told by your health care provider.  If you were prescribed an antibiotic medicine, take it as told by your health care provider. Do not stop taking the antibiotic even if you start to feel better.  Drink enough fluid to keep your urine clear or pale yellow.  Do not touch or rub the infected area.  Raise (elevate) the infected area above the level of your heart while you are sitting or lying down.  Apply warm or cold compresses to the affected area as told by your health care provider.  Keep all follow-up visits as told  by your health care provider. This is important. These visits let your health care provider make sure a more serious infection is not developing. Contact a health care provider if:  You have a fever.  Your symptoms do not improve within 1-2 days of starting treatment.  Your bone or joint underneath the infected area becomes painful after the skin has healed.  Your infection returns in the same area or another area.  You notice a swollen bump in the infected  area.  You develop new symptoms.  You have a general ill feeling (malaise) with muscle aches and pains. Get help right away if:  Your symptoms get worse.  You feel very sleepy.  You develop vomiting or diarrhea that persists.  You notice red streaks coming from the infected area.  Your red area gets larger or turns dark in color. This information is not intended to replace advice given to you by your health care provider. Make sure you discuss any questions you have with your health care provider. Document Released: 10/21/2004 Document Revised: 05/22/2015 Document Reviewed: 11/20/2014 Elsevier Interactive Patient Education  2017 [employer].

## 2016-10-28 NOTE — Progress Notes (Signed)
Subjective:    Patient ID: Amanda Wilson, female    DOB: 06/21/1990, 26 y.o.   MRN: [id number]  Amanda Wilson is a 26 y.o. female presenting on 10/28/2016 for Facial Swelling (swelling on the nose, with drainage, nauseated, chills, and vomiting x 1 days )   HPI Cellulitis Initially thought a raised red spot on nose was a pimple.  She did try to express it, but "it didn't pop" so knew at that point it wasn't a zit.  Then doubled in size overnight w/ redness and purple tint.  Pressure on face is causing headache.  Pressed upward gently on it yesterday and pus started leaking from multiple pores.  Then, it continued to leak clear fluid throughout the day. - Day 3 of swelling today, but has reduced by about 50% in size compared to yesterday.   - Has been taking tylenol for pain.   - Was nauseous w/ vomiting and poor appetite yesterday.  Then last night had some food, but w/ vomiting after eating.  Nausea w/o vomiting today. - Has had chills and sweats and feels cold today.  Has allergy to duricef, but has tolerated amoxicillin in past very well w/o reaction.  Depression No crying spells since psychiatry visit.  Still has fatigue and sleepiness.  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  Regular work hours are 10:30 am - 4 pm.  - Adapting well to her managers and having structured activity. - Emotion-based therapy w/ counselor.  Working through rather than suppressing emotions.  Reading about codependency.  Depression screen [facility] 2/9 10/28/2016 09/28/2016 08/31/2016 07/29/2016 06/22/2016  Decreased Interest 0 1 1 1 3   Down, Depressed, Hopeless 0 1 2 1 3   PHQ - 2 Score 0 2 3 2 6   Altered sleeping 3 2 2 2 2   Tired, decreased energy 3 2 1 1 2   Change in appetite 0 1 0 2 1  Feeling bad or failure about yourself  1 0 2 1 2   Trouble concentrating 0 1 0 0 1  Moving slowly or fidgety/restless 0 1 0 0 0  Suicidal thoughts 0 1 2 0 1  PHQ-9 Score 7 10  10 8 15   Difficult doing work/chores Not difficult at all Somewhat difficult Very difficult - Extremely dIfficult     Social History  Substance Use Topics  . Smoking status: Never Smoker  . Smokeless tobacco: Never Used  . Alcohol use Yes     Comment: Occasional glass of wine    Review of Systems Per HPI unless specifically indicated above     Objective:    BP 126/64 (BP Location: Right Arm, Patient Position: Sitting, Cuff Size: Large)   Pulse 76   Temp 98.3 F (36.8 C) (Oral)   Ht 5\' 4"  (1.626 m)   Wt 279 lb 3.2 oz (126.6 kg)   BMI 47.92 kg/m   Wt Readings from Last 3 Encounters:  10/28/16 279 lb 3.2 oz (126.6 kg)  09/28/16 282 lb (127.9 kg)  08/31/16 278 lb 12.8 oz (126.5 kg)    Physical Exam  General - obese, well-appearing, NAD HEENT - Normocephalic, atraumatic Neck - supple, non-tender, no LAD, no thyromegaly, no carotid bruit Heart - RRR, no murmurs heard Lungs - Clear throughout all lobes, no wheezing, crackles, or rhonchi. Normal work of breathing. Extremeties - non-tender, no edema, cap refill < 2 seconds, peripheral pulses intact +2 bilaterally Skin - warm, diaphoretic.  Nasal edema  w/ erythema and scabbing.  Erythema spreads to 50% cheeks bilaterally. (See picture below) Neuro - awake, alert, oriented x3, normal gait Psych - Normal mood and affect, normal behavior         Assessment & Plan:   Problem List Items Addressed This Visit      Other   Anxiety and depression    Continues to improve on Prozac and Hydroxyzine per psychiatry.  Therapeutic relationship w/ psychology also providing pt opportunity for non-pharm management of symptoms.  Plan: 1. Continue management w/ [facility] for medications and counseling. 2. Continue meaningful activities at home and work.   3. Continue eating well and exercising regularly.   4. Follow up as needed.       Other Visit Diagnoses    Cellulitis of face    -  Primary Acute cellulitis w/  prior purulent drainage of nose and bilateral maxillary skin w/ systemic response of malaise, nausea/vomiting and diaphoresis.  Pt notes some improvement, but is involving nose which could potentially impair airway if worsening.  Plan: 1. START doxycycline 100 mg twice daily x 10 days. 2. START Augmentin 875-125 mg twice daily x 10 days. 3. May apply warm compress 2x per day. 4. Follow up 5-7 days as needed.  Can consider prednisone if persistent edema remains.   Relevant Medications   doxycycline (VIBRA-TABS) 100 MG tablet   amoxicillin-clavulanate (AUGMENTIN) 875-125 MG tablet      Meds ordered this encounter  Medications  . doxycycline (VIBRA-TABS) 100 MG tablet    Sig: Take 1 tablet (100 mg total) by mouth 2 (two) times daily.    Dispense:  20 tablet    Refill:  0    Order Specific Question:   Supervising Provider    Answer:   Olin Hauser [[id number]]  . amoxicillin-clavulanate (AUGMENTIN) 875-125 MG tablet    Sig: Take 1 tablet by mouth 2 (two) times daily.    Dispense:  20 tablet    Refill:  0    Order Specific Question:   Supervising Provider    Answer:   Olin Hauser [[id number]]      Follow up plan: Return in about 2 months (around 12/28/2016) for annual physical instead of anxiety and depression.  [name], DNP, AGPCNP-BC Adult Gerontology Primary Care Nurse Practitioner [facility] 10/28/2016, 8:13 AM

## 2016-11-02 ENCOUNTER — Ambulatory Visit (HOSPITAL_COMMUNITY): Payer: 59 | Admitting: Psychiatry

## 2016-11-11 ENCOUNTER — Ambulatory Visit (INDEPENDENT_AMBULATORY_CARE_PROVIDER_SITE_OTHER): Payer: 59 | Admitting: Psychiatry

## 2016-11-11 ENCOUNTER — Encounter (HOSPITAL_COMMUNITY): Payer: Self-pay | Admitting: Psychiatry

## 2016-11-11 VITALS — BP 128/74 | HR 86 | Ht 64.0 in | Wt 278.6 lb

## 2016-11-11 DIAGNOSIS — F3341 Major depressive disorder, recurrent, in partial remission: Secondary | ICD-10-CM

## 2016-11-11 DIAGNOSIS — Z818 Family history of other mental and behavioral disorders: Secondary | ICD-10-CM | POA: Diagnosis not present

## 2016-11-11 DIAGNOSIS — Z79899 Other long term (current) drug therapy: Secondary | ICD-10-CM

## 2016-11-11 DIAGNOSIS — G479 Sleep disorder, unspecified: Secondary | ICD-10-CM

## 2016-11-11 DIAGNOSIS — F411 Generalized anxiety disorder: Secondary | ICD-10-CM | POA: Diagnosis not present

## 2016-11-11 MED ORDER — HYDROXYZINE PAMOATE 25 MG PO CAPS: 25.0000 mg | ORAL_CAPSULE | Freq: Three times a day (TID) | ORAL | 2 refills | Status: DC | PRN

## 2016-11-11 NOTE — Progress Notes (Signed)
BH MD/PA/NP OP Progress Note  11/11/2016 9:09 AM Amanda Wilson  MRN:  [id number]  Chief Complaint: med check HPI: Amanda Wilson reports that depression symptoms are about 80-90% better, and anxiety is also approximately the same in terms of improvement. She has not had any intolerance to Prozac 20 mg and uses the Vistaril 1-2 times a day. She is sleeping well, reports motivation to work on her diet habits and self improvement. She has started working with [facility] in individual therapy and has homework assignments for reading certain books about codependence.   Spent time with the patient processing some of the skills she continues to learn. I spent time hearing about some of her stressors at work and providing her with feedback. I spent time with the patient discussing the value of ignoring, and setting limits, strategies to use in conflicts at work.   We agreed to follow-up in 3 months or sooner if needed. She will reach out to this writer if she feels the need to increase Prozac to 40 mg in the coming months.  Visit Diagnosis:    ICD-10-CM   1. Generalized anxiety disorder F41.1   2. Recurrent major depressive disorder, in partial remission (HCC) F33.41 hydrOXYzine (VISTARIL) 25 MG capsule    Past Psychiatric History: See intake H&P for full details. Reviewed, with no updates at this time.   Past Medical History:  Past Medical History:  Diagnosis Date  . Anxiety   . Depression   . Frequent headaches    History reviewed. No pertinent surgical history.  Family Psychiatric History: See intake H&P for full details. Reviewed, with no updates at this time.   Family History:  Family History  Problem Relation Age of Onset  . Cancer Mother        skin  . Depression Mother   . Heart disease Father   . Stroke Father   . Diabetes Father   . Depression Sister   . Depression Maternal Aunt   . Dementia Maternal Grandfather   . Depression Cousin     Social History:  Social  History   Social History  . Marital status: Single    Spouse name: N/A  . Number of children: N/A  . Years of education: N/A   Social History Main Topics  . Smoking status: Never Smoker  . Smokeless tobacco: Never Used  . Alcohol use Yes     Comment: Occasional glass of wine  . Drug use: Yes    Types: Marijuana     Comment: Uses once a month for anxiety  . Sexual activity: Yes    Partners: Male    Birth control/ protection: Pill   Other Topics Concern  . None   Social History Narrative  . None    Allergies:  Allergies  Allergen Reactions  . Duricef [Cefadroxil]     Skin sloughing  . Cephalosporins Rash  . Latex Rash    Metabolic Disorder Labs: No results found for: HGBA1C, MPG No results found for: PROLACTIN No results found for: CHOL, TRIG, HDL, CHOLHDL, VLDL, LDLCALC No results found for: TSH  Therapeutic Level Labs: No results found for: LITHIUM No results found for: VALPROATE No components found for:  CBMZ  Current Medications: Current Outpatient Prescriptions  Medication Sig Dispense Refill  . FLUoxetine (PROZAC) 20 MG capsule Take 1 capsule (20 mg total) by mouth daily. Increase to 2 tablets (40 mg) in 2 weeks 90 capsule 1  . hydrOXYzine (VISTARIL) 25 MG capsule Take 1 capsule (  25 mg total) by mouth 3 (three) times daily as needed for anxiety (sleep, anxiety). Use 1-2 tablets for sleep or anxiety 120 capsule 2  . Multiple Vitamin (MULTIVITAMIN) tablet Take 1 tablet by mouth daily.    . norethindrone-ethinyl estradiol (MICROGESTIN,JUNEL,LOESTRIN) 1-20 MG-MCG tablet Take 1 tablet by mouth daily. 1 Package 11   No current facility-administered medications for this visit.      Musculoskeletal: Strength & Muscle Tone: within normal limits Gait & Station: normal Patient leans: N/A  Psychiatric Specialty Exam: ROS  Blood pressure 128/74, pulse 86, height 5\' 4"  (1.626 m), weight 278 lb 9.6 oz (126.4 kg).Body mass index is 47.82 kg/m.  General  Appearance: Casual and Well Groomed  Eye Contact:  Good  Speech:  Clear and Coherent  Volume:  Normal  Mood:  Euthymic and cheerful  Affect:  Congruent  Thought Process:  Goal Directed and Descriptions of Associations: Intact  Orientation:  Full (Time, Place, and Person)  Thought Content: Logical   Suicidal Thoughts:  No  Homicidal Thoughts:  No  Memory:  Immediate;   Good  Judgement:  Good  Insight:  Good  Psychomotor Activity:  Normal  Concentration:  Concentration: Good  Recall:  Good  Fund of Knowledge: Good  Language: Good  Akathisia:  Negative  Handed:  Right  AIMS (if indicated): not done  Assets:  Communication Skills Desire for Improvement Financial Resources/Insurance Housing Leisure Time [place] Talents/Skills Transportation Vocational/Educational  ADL's:  Intact  Cognition: WNL  Sleep:  Good   Screenings: GAD-7     Office Visit from 08/31/2016 in [facility] Office Visit from 07/29/2016 in [facility] Office Visit from 06/22/2016 in [facility]  Total GAD-7 Score  8  14  16     PHQ2-9     Office Visit from 10/28/2016 in [facility] Office Visit from 09/28/2016 in [facility] Office Visit from 08/31/2016 in [facility] Office Visit from 07/29/2016 in [facility] Office Visit from 06/22/2016 in [facility]  PHQ-2 Total Score  0  2  3  2  6   PHQ-9 Total Score  7  10  10  8  15        Assessment and Plan:  Amanda Wilson is a 26 year old female with major depressive disorder and generalized anxiety, with a significant improvement in mood symptoms and anxiety symptoms.  Her symptoms are nearly in remission, and I suspect she will have significant ongoing improvement as she engages in individual therapy, and fluoxetine has further time in her system.  1. Generalized anxiety disorder   2. Recurrent major  depressive disorder, in partial remission ([place])     Status of current problems: gradually improving  Labs Ordered: No orders of the defined types were placed in this encounter.   Labs Reviewed: n/a  Collateral Obtained/Records Reviewed: n/a  Plan:  Continue fluoxetine 20 mg daily, if she has a plateau improvement, we can increase to 40 mg Continue Vistaril 1-2 times daily for anxiety or sleep Return to clinic in 3 months Continue in individual therapy  I spent 25 minutes with the patient in direct face-to-face clinical care.  Greater than 50% of this time was spent in counseling and coordination of care with the patient.    Aundra Dubin, MD 11/11/2016, 9:09 AM

## 2016-11-26 ENCOUNTER — Ambulatory Visit (INDEPENDENT_AMBULATORY_CARE_PROVIDER_SITE_OTHER): Payer: 59 | Admitting: Licensed Clinical Social Worker

## 2016-11-26 ENCOUNTER — Encounter (HOSPITAL_COMMUNITY): Payer: Self-pay | Admitting: Licensed Clinical Social Worker

## 2016-11-26 DIAGNOSIS — F411 Generalized anxiety disorder: Secondary | ICD-10-CM | POA: Diagnosis not present

## 2016-11-26 NOTE — Progress Notes (Signed)
   THERAPIST PROGRESS NOTE  Session Time: 9-10  Participation Level: Active  Behavioral Response: NeatAlertEuthymic  Type of Therapy: Individual Therapy  Treatment Goals addressed: Anxiety and Diagnosis: Codependency  Interventions: CBT, Motivational Interviewing, Assertiveness Training and Reframing  Summary: Amanda Wilson is a 26 y.o. female who presents with mixed anxiety and depressed mood related to feelings of codependency and an "inability to tell other people 'no'". She reports she is reading "Codependent No More" at the recommendation of this counselor and is enjoying it. 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 She states she continues to feel guilty when she does not want to help someone who asks her for help. She states she has spent the last 3 weekends babysitting her friends kids for free at the request of the friend. Pt admits this is "crazy" but does not appear to be willing to endure the discomfort of asserting herself.   Counselor spent time gaining insight into pt's love of horror films. She admitted she has seen ghosts and had other "unexplainable phenomena" occur in her house that makes her drawn to the paranormal. Pt reports she has seen "a little girl and a furry dog" that both "were not really there" in her home while she was alone.  Pt was encouraged to try "saying no" to 3 situations this weekend in order to better take care of herself.   Suicidal/Homicidal: Nowithout intent/plan  Therapist Response: Counselor used open questions as empathy to help client gain insight into her experience of anxiety. Counselor recommended biblio therapy in the form or continued reading of "Codependent No More". Counselor discussed assertiveness techniques for saying "no" when people request things from her.  Plan: Return again in 2 weeks.  Diagnosis:    ICD-10-CM   1. Generalized anxiety disorder F41.[place] Swan, LCAS-A 11/26/2016

## 2016-12-08 ENCOUNTER — Ambulatory Visit: Payer: 59 | Admitting: Family Medicine

## 2016-12-08 ENCOUNTER — Encounter: Payer: Self-pay | Admitting: Family Medicine

## 2016-12-08 VITALS — BP 120/78 | HR 77 | Temp 99.2°F | Resp 16 | Ht 64.0 in | Wt 281.0 lb

## 2016-12-08 DIAGNOSIS — J209 Acute bronchitis, unspecified: Secondary | ICD-10-CM

## 2016-12-08 MED ORDER — BENZONATATE 100 MG PO CAPS: 100.0000 mg | ORAL_CAPSULE | Freq: Three times a day (TID) | ORAL | 0 refills | Status: DC | PRN

## 2016-12-08 MED ORDER — AZITHROMYCIN 250 MG PO TABS: ORAL_TABLET | ORAL | 0 refills | Status: DC

## 2016-12-08 MED ORDER — IPRATROPIUM BROMIDE 0.06 % NA SOLN: 2.0000 | Freq: Four times a day (QID) | NASAL | 0 refills | Status: DC

## 2016-12-08 NOTE — Progress Notes (Signed)
Subjective:    Patient ID: Amanda Wilson, female    DOB: 04/27/1990, 26 y.o.   MRN: [id number]  Amanda Wilson is a 26 y.o. female presenting on 12/08/2016 for Cough (onset 5 days cough, chills but rarely fever started with sore throat but this its irritation from coughing)  Patient presents for a same day appointment.  HPI   ACUTE BRONCHITIS: Reports symptoms started about 5 days ago with sore throat and "tickle" in throat, she does  - Worsening coughing now more productive thicker green sputum over past 2 days, and persistent coughing has some pain behind eyes, and sore throat worse - Tried Tylenol for chills and some DayQuil - Recently possible known sick contact with brother who has asthma with inc coughing - Admits some chills, associated headaches behind eyes, R ear discomfort only, but some ear pressure, also some abdominal discomfort with coughing - Additionally elevated BP on initial reading, but states she was coughing during BP reading - Denies fevers, nausea vomiting, diarrhea, body aches  Health Maintenance: - Due for Flu Shot, did not receive this year, waiting to go in to work  Depression screen [facility] 2/9 10/28/2016 09/28/2016 08/31/2016  Decreased Interest 0 1 1  Down, Depressed, Hopeless 0 1 2  PHQ - 2 Score 0 2 3  Altered sleeping 3 2 2   Tired, decreased energy 3 2 1   Change in appetite 0 1 0  Feeling bad or failure about yourself  1 0 2  Trouble concentrating 0 1 0  Moving slowly or fidgety/restless 0 1 0  Suicidal thoughts 0 1 2  PHQ-9 Score 7 10 10   Difficult doing work/chores Not difficult at all Somewhat difficult Very difficult    Social History   Tobacco Use  . Smoking status: Never Smoker  . Smokeless tobacco: Never Used  Substance Use Topics  . Alcohol use: Yes    Comment: Occasional glass of wine  . Drug use: Yes    Types: Marijuana    Comment: Uses once a month for anxiety    Review of Systems Per HPI unless specifically indicated  above     Objective:    BP 120/78   Pulse 77   Temp 99.2 F (37.3 C) (Oral)   Resp 16   Ht 5\' 4"  (1.626 m)   Wt 281 lb (127.5 kg)   SpO2 100%   BMI 48.23 kg/m   Wt Readings from Last 3 Encounters:  12/08/16 281 lb (127.5 kg)  10/28/16 279 lb 3.2 oz (126.6 kg)  09/28/16 282 lb (127.9 kg)    Physical Exam  Constitutional: She is oriented to person, place, and time. She appears well-developed and well-nourished. No distress.  Mildly ill appearing, uncomfortable d/t coughing spells, cooperative, obese  HENT:  Head: Normocephalic and atraumatic.  Mouth/Throat: Oropharynx is clear and moist.  Frontal / maxillary sinuses non-tender. Nares with some congestion without purulence. Bilateral TMs clear without erythema, effusion but some bulging R>L d/t cough. Oropharynx with non specific posterior pharyngeal erythema changes without focal exudates, edema or asymmetry.  Eyes: Conjunctivae are normal. Right eye exhibits no discharge. Left eye exhibits no discharge.  Neck: Normal range of motion. Neck supple.  Cardiovascular: Normal rate, regular rhythm, normal heart sounds and intact distal pulses.  No murmur heard. Pulmonary/Chest: Effort normal and breath sounds normal. No respiratory distress. She has no wheezes. She has no rales.  Frequent coughing spells, has forceful cough. Non productive. Speaks full sentences. Good air movement. No focal lung  sounds. No overt wheezing.  Musculoskeletal: She exhibits no edema.  Lymphadenopathy:    She has no cervical adenopathy.  Neurological: She is alert and oriented to person, place, and time.  Skin: Skin is warm and dry. No rash noted. She is not diaphoretic. No erythema.  Psychiatric: Her behavior is normal.  Nursing note and vitals reviewed.  No results found for this or any previous visit.    Assessment & Plan:   Problem List Items Addressed This Visit    None    Visit Diagnoses    Acute bronchitis, unspecified organism    -   Primary  Consistent with worsening bronchitis in setting of likely viral URI (+sick contacts), also likely allergic rhinitis component. Concern with duration worsening >5 days - Afebrile, no focal signs of infection (not consistent with pneumonia by history or exam), no evidence sinusitis. No wheezing. Persistent coughing spells.  Plan: 1. Start Azithromycin Z-pak dosing 500mg  then 250mg  daily x 4 days - May use existing Albuterol PRN if worse - Start Atrovent nasal spray decongestant 2 sprays in each nostril up to 4 times daily for 7 days - Start [employer] take 1 capsule up to 3 times a day as needed for cough - Recommend trial OTC - Mucinex, Tylenol/Ibuprofen PRN, Nasal saline, lozenges, tea with honey/lemon 2. Return criteria reviewed, follow-up within 1 week if not improved  Work note for Thurs/Fri     Relevant Medications   azithromycin (ZITHROMAX Z-PAK) 250 MG tablet   benzonatate (TESSALON) 100 MG capsule   ipratropium (ATROVENT) 0.06 % nasal spray      Meds ordered this encounter  Medications  . azithromycin (ZITHROMAX Z-PAK) 250 MG tablet    Sig: Take 2 tabs (500mg  total) on Day 1. Take 1 tab (250mg ) daily for next 4 days.    Dispense:  6 tablet    Refill:  0  . benzonatate (TESSALON) 100 MG capsule    Sig: Take 1 capsule (100 mg total) 3 (three) times daily as needed by mouth for cough.    Dispense:  30 capsule    Refill:  0  . ipratropium (ATROVENT) 0.06 % nasal spray    Sig: Place 2 sprays 4 (four) times daily into both nostrils. For up to 5-7 days then stop.    Dispense:  15 mL    Refill:  0     Follow up plan: Return in about 2 weeks (around 12/22/2016), or if symptoms worsen or fail to improve, for bronchitis.  Nobie Putnam, DO [facility] 12/08/2016, 8:31 PM

## 2016-12-08 NOTE — Patient Instructions (Addendum)
Thank you for coming to the clinic today.  1.  It sounds like you had an Upper Respiratory Virus that has settled into a Bronchitis, lower respiratory tract infection. I don't have concerns for pneumonia today, and think that this should gradually improve. Once you are feeling better, the cough may take a few weeks to fully resolve.  start Azithromycin Z pak (antibiotic) 2 tabs day 1, then 1 tab x 4 days, complete entire course even if improved  Start Atrovent nasal spray decongestant 2 sprays in each nostril up to 4 times daily for 7 days  Start Tessalon Perls take 1 capsule up to 3 times a day as needed for cough  Recommend trial of Mucinex-DM  - Use Albuterol inhaler 2 puffs every 4-6 hours around the clock for next 2-3 days, max up to 5 days then use as needed  - Use nasal saline (Simply Saline or Ocean Spray) to flush nasal congestion multiple times a day, may help cough - Drink plenty of fluids to improve congestion  If your symptoms seem to worsen instead of improve over next several days, including significant fever / chills, worsening shortness of breath, worsening wheezing, or nausea / vomiting and can't take medicines - return sooner or go to hospital Emergency Department for more immediate treatment.  Please schedule a Follow-up Appointment to: Return in about 2 weeks (around 12/22/2016), or if symptoms worsen or fail to improve, for bronchitis.  If you have any other questions or concerns, please feel free to call the clinic or send a message through [place]. You may also schedule an earlier appointment if necessary.  Additionally, you may be receiving a survey about your experience at our clinic within a few days to 1 week by e-mail or mail. We value your feedback.  Nobie Putnam, DO [place]

## 2016-12-10 ENCOUNTER — Ambulatory Visit (HOSPITAL_COMMUNITY): Payer: Self-pay | Admitting: Licensed Clinical Social Worker

## 2016-12-24 ENCOUNTER — Ambulatory Visit (INDEPENDENT_AMBULATORY_CARE_PROVIDER_SITE_OTHER): Payer: 59 | Admitting: Licensed Clinical Social Worker

## 2016-12-24 ENCOUNTER — Encounter (HOSPITAL_COMMUNITY): Payer: Self-pay | Admitting: Licensed Clinical Social Worker

## 2016-12-24 DIAGNOSIS — F332 Major depressive disorder, recurrent severe without psychotic features: Secondary | ICD-10-CM

## 2016-12-24 DIAGNOSIS — F411 Generalized anxiety disorder: Secondary | ICD-10-CM | POA: Diagnosis not present

## 2016-12-24 NOTE — Progress Notes (Signed)
   THERAPIST PROGRESS NOTE  Session Time: 9-10  Participation Level: Active  Behavioral Response: Casual and Fairly GroomedAlertHopeless  Type of Therapy: Individual Therapy  Treatment Goals addressed: Anxiety and Diagnosis: MDD  Interventions: CBT and Supportive  Summary: Dusty Wagoner is a 26 y.o. female who presents in an episode of major depression w/ generalized anxiety. 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 Pt admits she has recently realized that [name] is "not interested in a long term relationship and does not respect pt the way she wants a man to". Pt continues to contact him and exhibit a severely codependent pattern of "not being able to make myself happy". Pt admits she "has never know how to make herself happy and she often struggles to know what she likes to do".   Counselor asks pt to set an agenda for today's session. Pt responds that she needs help coping w/ her depression. Counselor and pt discuss any medication changes and pt admits she recently got sick w/ a severe flu and went 3 days w/o taking any of her medications. Pt was encouraged to return to her normal pattern and discuss increasing her Fluoxetine dosage from 20 to 40mg  daily.  Pt admits she has "no healthy self care patterns" and can "barely bring herself to watch netflix (she states she just continues to watch the same show for the 10th time)".   Counselor and pt discuss former activities that brought her pleasure including pottery, exercise, drawing portraits, nature walks, and hiking. Pt was encouraged to spend time today doing at least a 1hour activity "w/ the sole purpose of being by and w/ herself". Pt discussed window shopping and going to [place] alone to drink coffee and read magazines about pottery.  Significant PT  Phrases: "I'm beyond repair"; "I need to feel my feelings"; "I feel hollow inside";   Suicidal/Homicidal: Nowithout intent/plan  Therapist Response: Counselor used supportive reflection to help pt gain insight into her debilitating codependency and how it most likely fuels her depression. Pt was encouraged using Psychoeducation about tx effectiveness for MDD w/ combo of talk therapy and psychopharmacology. Counselor used immediacy of emotion to reflect that pt's "boyfriend's tx of her is disturbing counselor and causing him to become emotional" given that pt discusses multiple ways that [name] belittles her, sexualizes her, objectifies her, and dismisses her feelings. Pt agrees and states that she "rarely allows herself to sit w/ her feelings of hollowness and inadequacy" and chooses instead to find someone else to help or do something for.   Plan: Return again in 2 weeks.  Diagnosis:    ICD-10-CM   1. Generalized anxiety disorder F41.1   2. Severe episode of recurrent major depressive disorder, without psychotic features [facility]) F33.2        Archie Balboa, LCAS-A 12/24/2016

## 2016-12-28 ENCOUNTER — Encounter: Payer: Self-pay | Admitting: Nurse Practitioner

## 2017-01-10 ENCOUNTER — Other Ambulatory Visit (HOSPITAL_COMMUNITY): Payer: Self-pay | Admitting: Psychiatry

## 2017-01-10 ENCOUNTER — Encounter (HOSPITAL_COMMUNITY): Payer: Self-pay | Admitting: Psychiatry

## 2017-01-10 DIAGNOSIS — F332 Major depressive disorder, recurrent severe without psychotic features: Secondary | ICD-10-CM

## 2017-01-10 MED ORDER — FLUOXETINE HCL 40 MG PO CAPS: 40.0000 mg | ORAL_CAPSULE | Freq: Every day | ORAL | 1 refills | Status: DC

## 2017-01-14 ENCOUNTER — Ambulatory Visit (HOSPITAL_COMMUNITY): Payer: Self-pay | Admitting: Licensed Clinical Social Worker

## 2017-01-24 ENCOUNTER — Ambulatory Visit (HOSPITAL_COMMUNITY): Payer: Self-pay | Admitting: Licensed Clinical Social Worker

## 2017-02-02 ENCOUNTER — Other Ambulatory Visit: Payer: Self-pay | Admitting: Nurse Practitioner

## 2017-02-02 DIAGNOSIS — N946 Dysmenorrhea, unspecified: Secondary | ICD-10-CM

## 2017-02-02 NOTE — Telephone Encounter (Signed)
Pt needs prescription for birth control sent to [facility].  This will be her new pharmacy.

## 2017-02-03 MED ORDER — NORETHINDRONE ACET-ETHINYL EST 1-20 MG-MCG PO TABS
1.0000 | ORAL_TABLET | Freq: Every day | ORAL | 11 refills | Status: DC
Start: 2017-02-03 — End: 2017-04-26

## 2017-02-07 ENCOUNTER — Ambulatory Visit (HOSPITAL_COMMUNITY): Payer: 59 | Admitting: Licensed Clinical Social Worker

## 2017-02-07 ENCOUNTER — Encounter (HOSPITAL_COMMUNITY): Payer: Self-pay | Admitting: Licensed Clinical Social Worker

## 2017-02-07 DIAGNOSIS — F411 Generalized anxiety disorder: Secondary | ICD-10-CM | POA: Diagnosis not present

## 2017-02-07 DIAGNOSIS — F3341 Major depressive disorder, recurrent, in partial remission: Secondary | ICD-10-CM | POA: Diagnosis not present

## 2017-02-07 NOTE — Progress Notes (Signed)
   THERAPIST PROGRESS NOTE  Session Time: 10-11  Participation Level: Active  Behavioral Response: Fairly Groomed and NeatAlertEuthymic and tearful  Type of Therapy: Individual Therapy  Treatment Goals addressed: Diagnosis: MDD, GAD  Interventions: CBT, Assertiveness Training, Supportive and Reframing  Summary: Amanda Wilson is a 27 y.o. female who presents for help w/ depression, anxiety, and codependency issues. She reports she recently decided to "disengage" from her friend [name] who has been an on and off love interest of hers "since the moment she met him". Pt states she realizes their relationship is unhealthy and she struggles w/ asserting herself, getting her needs met, and having the kind of healthy relationship she wants w/ him. Pt reports [name] recently decided to pursue another romantic relation while also asking to have "benefits" w/ pt. Pt realized in this moment that she needed to end her relationship w/ him and block him.  Pt is tearful and talks of her "brokenness" throughout session. She admits she is lonely and hurting but also recognizes that she is "learning to love herself". Pt was encouraged to continue self care acts of "applying make up, keeping up w/ her hygiene" and was encouraged to reach out to others for support.  Pt admitted she is "ashamed of her depression" and does not like to have things that "may be perceived as wrong w/ her". She stated she feels "very much like contacting him" though she has not talked to him since around Christmas day and hopes to continue the block while she heals.   Suicidal/Homicidal: Nowithout intent/plan  Therapist Response: Counselor used open questions and encouraging reframes to help pt gain insight into her newly acquired self care skills. Pt was encouraged to continue to journal, and write poetry. Counselor assessed for pt level of functioning. Pt reported she has an interview w/ counseling graduate program w/ [employer] in 1  month.  Plan: Return again in 2 weeks.  Diagnosis:    ICD-10-CM   1. Generalized anxiety disorder F41.1   2. Recurrent major depressive disorder, in partial remission [facility]) F33.41        Archie Balboa, LCAS-A 02/07/2017

## 2017-02-11 ENCOUNTER — Other Ambulatory Visit (HOSPITAL_COMMUNITY): Payer: Self-pay

## 2017-02-11 ENCOUNTER — Ambulatory Visit (HOSPITAL_COMMUNITY): Payer: 59 | Admitting: Psychiatry

## 2017-02-11 ENCOUNTER — Encounter (HOSPITAL_COMMUNITY): Payer: Self-pay | Admitting: Psychiatry

## 2017-02-11 DIAGNOSIS — Z818 Family history of other mental and behavioral disorders: Secondary | ICD-10-CM

## 2017-02-11 DIAGNOSIS — Z810 Family history of intellectual disabilities: Secondary | ICD-10-CM

## 2017-02-11 DIAGNOSIS — F3342 Major depressive disorder, recurrent, in full remission: Secondary | ICD-10-CM

## 2017-02-11 DIAGNOSIS — F332 Major depressive disorder, recurrent severe without psychotic features: Secondary | ICD-10-CM

## 2017-02-11 DIAGNOSIS — F1210 Cannabis abuse, uncomplicated: Secondary | ICD-10-CM

## 2017-02-11 MED ORDER — HYDROXYZINE PAMOATE 25 MG PO CAPS: ORAL_CAPSULE | ORAL | 3 refills | Status: DC

## 2017-02-11 MED ORDER — FLUOXETINE HCL 40 MG PO CAPS: 40.0000 mg | ORAL_CAPSULE | Freq: Every day | ORAL | 1 refills | Status: DC
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
## 2017-02-18 ENCOUNTER — Ambulatory Visit: Payer: 59 | Admitting: Nurse Practitioner

## 2017-02-18 ENCOUNTER — Other Ambulatory Visit: Payer: Self-pay

## 2017-02-18 ENCOUNTER — Encounter: Payer: Self-pay | Admitting: Nurse Practitioner

## 2017-02-18 VITALS — BP 119/60 | HR 68 | Temp 98.4°F | Ht 64.0 in | Wt 288.2 lb

## 2017-02-18 DIAGNOSIS — R1030 Lower abdominal pain, unspecified: Secondary | ICD-10-CM

## 2017-02-18 DIAGNOSIS — H6503 Acute serous otitis media, bilateral: Secondary | ICD-10-CM | POA: Diagnosis not present

## 2017-02-18 DIAGNOSIS — M545 Low back pain, unspecified: Secondary | ICD-10-CM

## 2017-02-18 LAB — POCT URINALYSIS DIPSTICK
Bilirubin, UA: NEGATIVE
Blood, UA: NEGATIVE
Glucose, UA: NEGATIVE
Ketones, UA: NEGATIVE
Leukocytes, UA: NEGATIVE
Nitrite, UA: NEGATIVE
Protein, UA: NEGATIVE
Spec Grav, UA: 1.025 (ref 1.010–1.025)
Urobilinogen, UA: 0.2 E.U./dL
pH, UA: 5 (ref 5.0–8.0)

## 2017-02-18 LAB — POCT URINE PREGNANCY: Preg Test, Ur: NEGATIVE

## 2017-02-18 MED ORDER — AMOXICILLIN-POT CLAVULANATE 875-125 MG PO TABS: 1.0000 | ORAL_TABLET | Freq: Two times a day (BID) | ORAL | 0 refills | Status: AC

## 2017-02-18 NOTE — Progress Notes (Signed)
Subjective:    Patient ID: Amanda Wilson, female    DOB: [birth year], 27 y.o.   MRN: [id number]  Amanda Wilson is a 27 y.o. female presenting on 02/18/2017 for Ear Pain (bilateral ear pain, dizziness, and nausea x 2-3 weeks ) and Back Pain (lower back pain w/ episode of lower abdominal pain. Pt states the back pain worsen w/ urination. )   HPI Ear Pain Pt has been feeling a sharp pain on both sides in her ears and feeling very nauseous.  Sharp pain and dizziness and feelings of passing out without syncopal event.  Now is impacting work.  Has to sit down and rest for a while when it occurs.  Also pt notes every time she rides in car, movement makes her dizzy.   No recent URI/Sinus difficulties. Has been taking OTC decongestants without significant improvement. - Has had this occur in past and did have syncopal event.  At that time, she had serous otitis.  Thoracic Back Pain Intermittent thoracic/bra-line back ache.  Pt admits is r/t overweight and large breasts.  Is not her primary concern today. She notes this is a little worse because of being on feet at her job  Generalized Abdominal pain and Nausea Onset Monday/Tuesday this week ( 4 days ago) pt had severe abdominal pain/bloating, felt very full and increased pressure in epigastric area.  This pain awakened her from sleep.  Since has had bilateral low back pain and pelvic pain described as moving through to the front of abdomen when she uses her abdominal muscles or goes to the bathroom for BM and urination. She also endorses pelvic/bladder pressure.  No known constipation/diarrhea.  No urinary frequency/urgency or burning with urination.  Pt notes is stable, non-worsening course.  Pain 2/10 at rest and increases to 6-7 with using bathroom and after urination or defecation for several minutes.  No association of pain to meals. - Pt has had dairy intolerance and improvement of GI symptoms with use of almond milks/creamers.  She has not  completely excluded dairy as she likes cheese.  Pt has not had these symptoms prior to 4 days ago when starting her psychiatric medications.   - Pt denies fever, chills, sweats, vomiting.   - Has had menses about every 4 weeks and no sex for last 5 months. No prior concern for STI, pt states she is most likely not pregnant.  Social History   Tobacco Use  . Smoking status: Never Smoker  . Smokeless tobacco: Never Used  Substance Use Topics  . Alcohol use: Yes    Comment: Occasional glass of wine  . Drug use: Yes    Types: Marijuana    Comment: Uses once a month for anxiety    Review of Systems Per HPI unless specifically indicated above     Objective:    BP 119/60 (BP Location: Right Arm, Patient Position: Sitting, Cuff Size: Large)   Pulse 68   Temp 98.4 F (36.9 C) (Oral)   Ht 5\' 4"  (1.626 m)   Wt 288 lb 3.2 oz (130.7 kg)   BMI 49.47 kg/m   Wt Readings from Last 3 Encounters:  02/18/17 288 lb 3.2 oz (130.7 kg)  12/08/16 281 lb (127.5 kg)  10/28/16 279 lb 3.2 oz (126.6 kg)    Physical Exam  Constitutional: She is oriented to person, place, and time. She appears well-developed and well-nourished. No distress.  HENT:  Head: Normocephalic and atraumatic.  Right Ear: Hearing, external ear and ear canal  normal. No mastoid tenderness. Tympanic membrane is erythematous and bulging. A middle ear effusion is present.  Left Ear: Hearing, external ear and ear canal normal. No mastoid tenderness. Tympanic membrane is erythematous and bulging. A middle ear effusion is present.  Nose: Nose normal. No mucosal edema or rhinorrhea. Right sinus exhibits no maxillary sinus tenderness and no frontal sinus tenderness. Left sinus exhibits no maxillary sinus tenderness and no frontal sinus tenderness.  Mouth/Throat: Uvula is midline, oropharynx is clear and moist and mucous membranes are normal.  Eyes: Conjunctivae are normal. Pupils are equal, round, and reactive to light.  Neck: Normal range  of motion. Neck supple. No JVD present. No tracheal deviation present. No thyromegaly present.  Cardiovascular: Normal rate, regular rhythm, normal heart sounds and intact distal pulses. Exam reveals no gallop and no friction rub.  No murmur heard. Pulmonary/Chest: Effort normal and breath sounds normal. No respiratory distress.  Abdominal: Soft. She exhibits no distension. Bowel sounds are increased. There is no hepatosplenomegaly (with scratch test). There is generalized tenderness (Generalized abdominal tenderness with increased pain to palpation RUQ, RLQ, Suprapubic, LLQ). There is no rigidity, no rebound, no guarding, no tenderness at McBurney's point and negative Murphy's sign.  Musculoskeletal: Normal range of motion.  Lymphadenopathy:    She has cervical adenopathy.  Neurological: She is alert and oriented to person, place, and time. No cranial nerve deficit.  Skin: Skin is warm and dry.  Psychiatric: She has a normal mood and affect. Her behavior is normal. Judgment and thought content normal.  Vitals reviewed.  Results for orders placed or performed in visit on 02/18/17  POCT Urinalysis Dipstick  Result Value Ref Range   Color, UA yellow    Clarity, UA clear    Glucose, UA negative    Bilirubin, UA negative    Ketones, UA negative    Spec Grav, UA 1.025 1.010 - 1.025   Blood, UA negative    pH, UA 5.0 5.0 - 8.0   Protein, UA negative    Urobilinogen, UA 0.2 0.2 or 1.0 E.U./dL   Nitrite, UA negative    Leukocytes, UA Negative Negative   Appearance clear    Odor    POCT urine pregnancy  Result Value Ref Range   Preg Test, Ur Negative Negative      Assessment & Plan:   Problem List Items Addressed This Visit    None    Visit Diagnoses    Acute bilateral low back pain without sciatica    -  Primary   Lower abdominal pain     Generalized pain with stable course and no improvement since onset of acute abdominal pain 4 days ago.  Initially epigastric pain and abdominal  bloating noted. Now has pelvic and low back pain with and after urination/defecation.  Pt also nauseous without vomiting.  Plan: 1. POCT UA and pregnancy - Negative for UTI, negative for pregnancy 2. CBC, CMP further evaluation of abdominal complaint. 3. If labs inconclusive, will consider pelvic US for pelvic pain and further GYN workup. 4.  Followup as needed. Return criteria provided.   Relevant Orders   CBC with Differential/Platelet   Comprehensive metabolic panel POCT Urinalysis Dipstick (Completed)   POCT urine pregnancy (Completed)    Bilateral acute serous otitis media, recurrence not specified     Consistent with bilateral AOM on exam with effusion and without perforation.  Eustachian tube dysfunction also likely given symptoms x 4 weeks. No recent URI or antibiotics. No prior known recent  AOM, but had recurrent AOM as child. Currently afebrile, well-appearing and non-toxic, well hydrated on exam.   - Complicated by dizziness x 4 weeks.  Plan: 1. Start Augmentin 875-125 mg one tablet twice daily x 10 days. 2. START pseudophed x 5 days,  START fluticasone x 2-6 weeks for eustachian tube dysfunction. 3. Improve hydration, regular diet as tolerated 4. Return criteria given   Relevant Medications   amoxicillin-clavulanate (AUGMENTIN) 875-125 MG tablet      Meds ordered this encounter  Medications  . amoxicillin-clavulanate (AUGMENTIN) 875-125 MG tablet    Sig: Take 1 tablet by mouth 2 (two) times daily for 10 days.    Dispense:  20 tablet    Refill:  0    Order Specific Question:   Supervising Provider    Answer:   Olin Hauser [[id number]]    Follow up plan: Return 5-7 days if symptoms worsen or fail to improve.  [name], DNP, AGPCNP-BC Adult Gerontology Primary Care Nurse Practitioner [facility] 02/18/2017, 8:36 AM

## 2017-02-18 NOTE — Patient Instructions (Addendum)
Amanda Wilson, Thank you for coming in to clinic today.  1. For your generalized abdominal pain: - Try dietary exclusions.  Take 2 weeks to exclude all dairy, all gluten, all soy.  You may do these one at a time.  The other option is to exclude all at once and add them back one at a time every 2 weeks.   2. Your lower abdominal pain and pelvic pain is not a UTI.  I am sending you for labs to find about possible causes.  3. Your ears have acute otitis media with fluid (effusion).   - START taking augmentin 800-160 mg one tablet twice daily for 10 days. - Continue OTC decongestant, but change to pseudophed behind the counter for 5 days. - START fluticasone nasal spray.  Use 2 sprays in each nostril once daily for 2-6 weeks.  4. Your back ache is related to posture and weight.  Work to improve posture, hold shoulders back and head up.  Increase abdominal muscle strength to assist in this process.  5. Low back pain is also likely related to muscle strain.  START low back pain exercises below for 2-4 weeks.  Please schedule a follow-up appointment with [name], AGNP. Return 5-7 days if symptoms worsen or fail to improve.  If you have any other questions or concerns, please feel free to call the clinic or send a message through [place]. You may also schedule an earlier appointment if necessary.  You will receive a survey after today's visit either digitally by e-mail or paper by [employer]. Your experiences and feedback matter to [place].  Please respond so we know how we are doing as we provide care for you.   [name], DNP, AGNP-BC Adult Gerontology Nurse Practitioner [facility], [facility]   Low Back Pain Exercises See other page with pictures of each exercise.  Start with 1 or 2 of these exercises that you are most comfortable with. Do not do any exercises that cause you significant worsening pain. Some of these may cause some "stretching soreness" but it should go away  after you stop the exercise, and get better over time. Gradually increase up to 3-4 exercises as tolerated.  Standing hamstring stretch: Place the heel of your leg on a stool about 15 inches high. Keep your knee straight. Lean forward, bending at the hips until you feel a mild stretch in the back of your thigh. Make sure you do not roll your shoulders and bend at the waist when doing this or you will stretch your lower back instead. Hold the stretch for 15 to 30 seconds. Repeat 3 times. Repeat the same stretch on your other leg.  Cat and camel: Get down on your hands and knees. Let your stomach sag, allowing your back to curve downward. Hold this position for 5 seconds. Then arch your back and hold for 5 seconds. Do 3 sets of 10.  Quadriped Arm/Leg Raises: Get down on your hands and knees. Tighten your abdominal muscles to stiffen your spine. While keeping your abdominals tight, raise one arm and the opposite leg away from you. Hold this position for 5 seconds. Lower your arm and leg slowly and alternate sides. Do this 10 times on each side.  Pelvic tilt: Lie on your back with your knees bent and your feet flat on the floor. Tighten your abdominal muscles and push your lower back into the floor. Hold this position for 5 seconds, then relax. Do 3 sets of 10.  Partial curl: Lie on your back with your knees bent and your feet flat on the floor. Tighten your stomach muscles and flatten your back against the floor. Tuck your chin to your chest. With your hands stretched out in front of you, curl your upper body forward until your shoulders clear the floor. Hold this position for 3 seconds. Don't hold your breath. It helps to breathe out as you lift your shoulders up. Relax. Repeat 10 times. Build to 3 sets of 10. To challenge yourself, clasp your hands behind your head and keep your elbows out to the side.  Lower trunk rotation: Lie on your back with your knees bent and your feet flat on the floor. Tighten  your abdominal muscles and push your lower back into the floor. Keeping your shoulders down flat, gently rotate your legs to one side, then the other as far as you can. Repeat 10 to 20 times.  Single knee to chest stretch: Lie on your back with your legs straight out in front of you. Bring one knee up to your chest and grasp the back of your thigh. Pull your knee toward your chest, stretching your buttock muscle. Hold this position for 15 to 30 seconds and return to the starting position. Repeat 3 times on each side.  Double knee to chest: Lie on your back with your knees bent and your feet flat on the floor. Tighten your abdominal muscles and push your lower back into the floor. Pull both knees up to your chest. Hold for 5 seconds and repeat 10 to 20 times.

## 2017-02-19 LAB — CBC WITH DIFFERENTIAL/PLATELET
Basophils Absolute: 0 10*3/uL (ref 0.0–0.2)
Basos: 0 %
EOS (ABSOLUTE): 0.1 10*3/uL (ref 0.0–0.4)
Eos: 1 %
Hematocrit: 35.3 % (ref 34.0–46.6)
Hemoglobin: 12.2 g/dL (ref 11.1–15.9)
Immature Grans (Abs): 0 10*3/uL (ref 0.0–0.1)
Immature Granulocytes: 0 %
Lymphocytes Absolute: 2.2 10*3/uL (ref 0.7–3.1)
Lymphs: 31 %
MCH: 25.1 pg — ABNORMAL LOW (ref 26.6–33.0)
MCHC: 34.6 g/dL (ref 31.5–35.7)
MCV: 73 fL — ABNORMAL LOW (ref 79–97)
Monocytes Absolute: 0.5 10*3/uL (ref 0.1–0.9)
Monocytes: 6 %
Neutrophils Absolute: 4.4 10*3/uL (ref 1.4–7.0)
Neutrophils: 62 %
Platelets: 277 10*3/uL (ref 150–379)
RBC: 4.86 x10E6/uL (ref 3.77–5.28)
RDW: 14.9 % (ref 12.3–15.4)
WBC: 7.2 10*3/uL (ref 3.4–10.8)

## 2017-02-19 LAB — COMPREHENSIVE METABOLIC PANEL
ALT: 13 IU/L (ref 0–32)
AST: 11 IU/L (ref 0–40)
Albumin/Globulin Ratio: 1.2 (ref 1.2–2.2)
Albumin: 4.2 g/dL (ref 3.5–5.5)
Alkaline Phosphatase: 64 IU/L (ref 39–117)
BUN/Creatinine Ratio: 16 (ref 9–23)
BUN: 10 mg/dL (ref 6–20)
Bilirubin Total: 0.3 mg/dL (ref 0.0–1.2)
CO2: 22 mmol/L (ref 20–29)
Calcium: 9.4 mg/dL (ref 8.7–10.2)
Chloride: 103 mmol/L (ref 96–106)
Creatinine, Ser: 0.61 mg/dL (ref 0.57–1.00)
GFR calc Af Amer: 146 mL/min/{1.73_m2} (ref >59)
GFR calc non Af Amer: 126 mL/min/{1.73_m2} (ref >59)
Globulin, Total: 3.4 g/dL (ref 1.5–4.5)
Glucose: 91 mg/dL (ref 65–99)
Potassium: 4.5 mmol/L (ref 3.5–5.2)
Sodium: 143 mmol/L (ref 134–144)
Total Protein: 7.6 g/dL (ref 6.0–8.5)

## 2017-02-21 ENCOUNTER — Ambulatory Visit (HOSPITAL_COMMUNITY): Payer: Self-pay | Admitting: Licensed Clinical Social Worker

## 2017-02-21 ENCOUNTER — Other Ambulatory Visit: Payer: Self-pay | Admitting: Nurse Practitioner

## 2017-02-21 DIAGNOSIS — Z1283 Encounter for screening for malignant neoplasm of skin: Secondary | ICD-10-CM | POA: Diagnosis not present

## 2017-02-21 DIAGNOSIS — R102 Pelvic and perineal pain: Secondary | ICD-10-CM

## 2017-02-21 DIAGNOSIS — L718 Other rosacea: Secondary | ICD-10-CM | POA: Diagnosis not present

## 2017-02-21 DIAGNOSIS — D229 Melanocytic nevi, unspecified: Secondary | ICD-10-CM | POA: Diagnosis not present

## 2017-03-07 ENCOUNTER — Ambulatory Visit (INDEPENDENT_AMBULATORY_CARE_PROVIDER_SITE_OTHER): Payer: 59 | Admitting: Licensed Clinical Social Worker

## 2017-03-07 DIAGNOSIS — F411 Generalized anxiety disorder: Secondary | ICD-10-CM

## 2017-03-07 DIAGNOSIS — F332 Major depressive disorder, recurrent severe without psychotic features: Secondary | ICD-10-CM

## 2017-03-08 ENCOUNTER — Encounter (HOSPITAL_COMMUNITY): Payer: Self-pay | Admitting: Licensed Clinical Social Worker
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
## 2017-03-14 ENCOUNTER — Ambulatory Visit: Admission: RE | Admit: 2017-03-14 | Payer: 59 | Source: Ambulatory Visit

## 2017-03-21 ENCOUNTER — Ambulatory Visit (HOSPITAL_COMMUNITY): Payer: Self-pay | Admitting: Licensed Clinical Social Worker

## 2017-04-04 ENCOUNTER — Ambulatory Visit (INDEPENDENT_AMBULATORY_CARE_PROVIDER_SITE_OTHER): Payer: 59 | Admitting: Licensed Clinical Social Worker

## 2017-04-04 ENCOUNTER — Encounter (HOSPITAL_COMMUNITY): Payer: Self-pay | Admitting: Licensed Clinical Social Worker

## 2017-04-04 DIAGNOSIS — F332 Major depressive disorder, recurrent severe without psychotic features: Secondary | ICD-10-CM | POA: Diagnosis not present

## 2017-04-04 DIAGNOSIS — F411 Generalized anxiety disorder: Secondary | ICD-10-CM
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
## 2017-04-20 ENCOUNTER — Ambulatory Visit (HOSPITAL_COMMUNITY): Payer: Self-pay | Admitting: Licensed Clinical Social Worker

## 2017-04-21 ENCOUNTER — Telehealth: Payer: Self-pay | Admitting: Nurse Practitioner

## 2017-04-21 NOTE — Telephone Encounter (Signed)
Pt has a couple of questions about birth control [phone]

## 2017-04-21 NOTE — Telephone Encounter (Signed)
Attempted to contact the pt, no answer. LMOM to return my call.  

## 2017-04-26 ENCOUNTER — Other Ambulatory Visit: Payer: Self-pay

## 2017-04-26 DIAGNOSIS — N946 Dysmenorrhea, unspecified: Secondary | ICD-10-CM

## 2017-04-26 MED ORDER — NORETHINDRONE ACET-ETHINYL EST 1-20 MG-MCG PO TABS: 1.0000 | ORAL_TABLET | Freq: Every day | ORAL | 10 refills | Status: DC

## 2017-04-26 NOTE — Telephone Encounter (Signed)
Brand name birth control only.

## 2017-04-26 NOTE — Telephone Encounter (Signed)
Attempted to contact the pt, no answer. LMOM to return my call.  

## 2017-04-28 ENCOUNTER — Ambulatory Visit (INDEPENDENT_AMBULATORY_CARE_PROVIDER_SITE_OTHER): Payer: 59 | Admitting: Licensed Clinical Social Worker

## 2017-04-28 DIAGNOSIS — F332 Major depressive disorder, recurrent severe without psychotic features: Secondary | ICD-10-CM | POA: Diagnosis not present

## 2017-04-28 NOTE — Progress Notes (Signed)
   THERAPIST PROGRESS NOTE  Session Time: 11-12  Participation Level: Active  Behavioral Response: CasualAlertEuthymic  Type of Therapy: Individual Therapy  Treatment Goals addressed: Diagnosis: MDD  Interventions: CBT, Strength-based and Supportive  Summary: Amanda Wilson is a 27 y.o. female who presents with MDD and hx of anxiety. She reports improved mood, improved tolerance for distress and discomfort, increased social confidence, and increased boundary setting. Pt reports she is telling her mother and brother "no" more often when she knows she "should not be overly-helping her family members". Pt speaks excitedly about how "she is starting to realize much about her pattern of taking care of others at her own expense".   Pt states she wants to have a conversation w/ her ex boyfriend "for herself" in order to get closure from their relationship. 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  "My depression has been getting better, I'm not so sad." "My anxiety is going down." "I saw Kefrin spontaneously while I was driving." "I feel more confident in general, and more social." "I want to have a conversation w/ Kefrin but I want it to be in pubic, w/ coffee, and I need to get some closure."  "I've been standing up for myself w/ my mother and it's really helping."   Suicidal/Homicidal: Nowithout intent/plan  Therapist Response: Counselor used supportive, encouraging techniques. Counselor reflected pt's work and progress in goals. Counselor used active listening and reflection of content and positive emotion to help pt gain insight into her ability to tolerate sadness w/o becoming depressed.  Plan: Return again in 2 weeks.  Diagnosis:    ICD-10-CM   1. Severe episode of recurrent major depressive disorder, without psychotic features [facility]) F33.2        Archie Balboa, LCAS-A 04/28/2017

## 2017-04-29 ENCOUNTER — Ambulatory Visit
Admission: RE | Admit: 2017-04-29 | Discharge: 2017-04-29 | Disposition: A | Discharge status: Home / Self Care | Payer: 59 | Source: Ambulatory Visit | Attending: Nurse Practitioner | Admitting: Nurse Practitioner

## 2017-04-29 ENCOUNTER — Other Ambulatory Visit: Payer: Self-pay

## 2017-04-29 ENCOUNTER — Encounter: Payer: Self-pay | Admitting: Nurse Practitioner

## 2017-04-29 ENCOUNTER — Ambulatory Visit (INDEPENDENT_AMBULATORY_CARE_PROVIDER_SITE_OTHER): Payer: 59 | Admitting: Nurse Practitioner

## 2017-04-29 ENCOUNTER — Encounter (HOSPITAL_COMMUNITY): Payer: Self-pay | Admitting: Licensed Clinical Social Worker

## 2017-04-29 VITALS — BP 119/63 | HR 83 | Temp 97.8°F | Ht 64.0 in | Wt 285.4 lb

## 2017-04-29 DIAGNOSIS — M25472 Effusion, left ankle: Secondary | ICD-10-CM

## 2017-04-29 DIAGNOSIS — M7989 Other specified soft tissue disorders: Secondary | ICD-10-CM | POA: Diagnosis not present

## 2017-04-29 DIAGNOSIS — S99912A Unspecified injury of left ankle, initial encounter: Secondary | ICD-10-CM | POA: Diagnosis not present

## 2017-04-29 DIAGNOSIS — M25572 Pain in left ankle and joints of left foot: Principal | ICD-10-CM

## 2017-04-29 NOTE — Addendum Note (Signed)
Addended by: [name] R on: 04/29/2017 10:25 AM   Modules accepted: Orders

## 2017-04-29 NOTE — Progress Notes (Signed)
Subjective:    Patient ID: Amanda Wilson, female    DOB: 1990-04-19, 27 y.o.   MRN: [id number]  Amanda Wilson is a 27 y.o. female presenting on 04/29/2017 for Ankle Injury (pt states she rolled her left ankle x 2 weeks ago. Pt describe the pain as a sharp stabbing pain that worsen with movement. )   HPI Ankle Injury Has broken this ankle in past.  Rolled ankle 2 weeks ago with inversion motion while walking on flat surface.  Initially had bruising and swelling.  She has had adequate conservative therapy with rest, ice, elevation, compression.  She continues to have sharp, stabbing pain with weight bearing movements.  She works as a Educational psychologist and is only able to work for about 1 hour before pain becomes unbearable and begins to swell.  When sitting feels like a sharp line of pain across ankle.  Standing > 5 mins has pins/needle feelings up and down leg. - Has chronic ankle pain r/t prior injury with fracture at age 2-6.  Difficult to stand on foot with pain.    Social/Anxiety update: Pt is doing well to keep her job, is loving the work.  She is also going to resume school in August at [employer].  Has also severed unhealthy relationship that was causing significant stress and anxiety since January.  Social History   Tobacco Use  . Smoking status: Never Smoker  . Smokeless tobacco: Never Used  Substance Use Topics  . Alcohol use: Yes    Comment: Occasional glass of wine  . Drug use: Yes    Types: Marijuana    Comment: Uses once a month for anxiety    Review of Systems Per HPI unless specifically indicated above     Objective:    BP 119/63 (BP Location: Right Arm, Patient Position: Sitting, Cuff Size: Large)   Pulse 83   Temp 97.8 F (36.6 C) (Oral)   Ht 5\' 4"  (1.626 m)   Wt 285 lb 6.4 oz (129.5 kg)   BMI 48.99 kg/m   Wt Readings from Last 3 Encounters:  04/29/17 285 lb 6.4 oz (129.5 kg)  02/18/17 288 lb 3.2 oz (130.7 kg)  12/08/16 281 lb (127.5 kg)    Physical Exam    Constitutional: She is oriented to person, place, and time. She appears well-developed and well-nourished. No distress.  HENT:  Head: Normocephalic and atraumatic.  Cardiovascular: Normal rate, regular rhythm, S1 normal, S2 normal, normal heart sounds and intact distal pulses.  Pulmonary/Chest: Effort normal and breath sounds normal. No respiratory distress.  Musculoskeletal:       Left ankle: She exhibits decreased range of motion (Decreased dorsiflexion > plantar flexion) and swelling (mildly edematous). She exhibits no ecchymosis and no deformity. Tenderness (anterior tibia). No lateral malleolus and no medial malleolus tenderness found. Achilles tendon normal.  Positive [place] Rules  Neurological: She is alert and oriented to person, place, and time.  Skin: Skin is warm and dry.  Psychiatric: She has a normal mood and affect. Her behavior is normal.  Vitals reviewed.     Assessment & Plan:   Problem List Items Addressed This Visit    None    Visit Diagnoses       Pain and swelling of left ankle      -  Primary Acute ankle pain with sprain that has not fully resolved over last 2 weeks.  Fracture cannot be excluded.  Plan:  1. Treat with OTC pain meds (acetaminophen and ibuprofen).  Discussed alternate dosing and max dosing. 2. Apply heat and/or ice to affected area. 3. May also apply a muscle rub with lidocaine or lidocaine patch after heat or ice. 4. DG left ankle today in clinic.  Consider referral to orthopedic surgery if needed for fracture. 5. Follow up as needed in next 1-2 weeks.   Relevant Orders   DG Ankle Complete Left   DG Tibia/Fibula Left      Follow up plan: Return 2 weeks if symptoms worsen or fail to improve.  [name], DNP, AGPCNP-BC Adult Gerontology Primary Care Nurse Practitioner [facility] 04/29/2017, 10:00 AM

## 2017-04-29 NOTE — Patient Instructions (Addendum)
Amanda Wilson,   Thank you for coming in to clinic today.  1. Continue Ice, Compression, rest, elevation.   - Start taking Tylenol extra strength 1 to 2 tablets every 6-8 hours for aches or fever/chills for next few days as needed.  Do not take more than 3,000 mg in 24 hours from all medicines.  May take Ibuprofen as well if tolerated 200-400mg  every 8 hours as needed. May alternate tylenol and ibuprofen in same day. - Use heat and ice.  Apply this for 15 minutes at a time 6-8 times per day.   - Muscle rub with lidocaine, lidocaine patch, Biofreeze, or tiger balm for topical pain relief.  Avoid using this with heat and ice to avoid burns.  Please schedule a follow-up appointment with [name], AGNP. Return 2 weeks if symptoms worsen or fail to improve.  If you have any other questions or concerns, please feel free to call the clinic or send a message through [place]. You may also schedule an earlier appointment if necessary.  You will receive a survey after today's visit either digitally by e-mail or paper by [employer]. Your experiences and feedback matter to [place].  Please respond so we know how we are doing as we provide care for you.   [name], DNP, AGNP-BC Adult Gerontology Nurse Practitioner [place]

## 2017-05-05 DIAGNOSIS — S93492A Sprain of other ligament of left ankle, initial encounter: Secondary | ICD-10-CM | POA: Diagnosis not present

## 2017-05-12 ENCOUNTER — Ambulatory Visit (HOSPITAL_COMMUNITY): Payer: Self-pay | Admitting: Licensed Clinical Social Worker

## 2017-05-26 ENCOUNTER — Ambulatory Visit (HOSPITAL_COMMUNITY): Payer: Self-pay | Admitting: Licensed Clinical Social Worker

## 2017-05-27 DIAGNOSIS — S93492D Sprain of other ligament of left ankle, subsequent encounter: Secondary | ICD-10-CM | POA: Diagnosis not present

## 2017-06-22 ENCOUNTER — Ambulatory Visit (INDEPENDENT_AMBULATORY_CARE_PROVIDER_SITE_OTHER): Payer: 59 | Admitting: Licensed Clinical Social Worker

## 2017-06-22 DIAGNOSIS — F411 Generalized anxiety disorder: Secondary | ICD-10-CM | POA: Diagnosis not present

## 2017-06-22 DIAGNOSIS — F332 Major depressive disorder, recurrent severe without psychotic features: Secondary | ICD-10-CM | POA: Diagnosis not present

## 2017-06-24 ENCOUNTER — Encounter (HOSPITAL_COMMUNITY): Payer: Self-pay | Admitting: Licensed Clinical Social Worker

## 2017-06-24 NOTE — Progress Notes (Signed)
   THERAPIST PROGRESS NOTE  Session Time: 2-3  Participation Level: Active  Behavioral Response: Casual and Fairly GroomedAlertEuthymic  Type of Therapy: Individual Therapy  Treatment Goals addressed: Anxiety and Coping  Interventions: CBT, Assertiveness Training and Supportive  Summary: Amanda Wilson is a 27 y.o. female who presents with anxiety and depression. She is active and engaged in session. She states she is noticing she is not as disappointed w/ rejection by others. She states she has begun talking to her ex boyfriend Amanda Wilson and feels "it is different this time because I am talking to him on my terms". Pt denies compromising her original goals of not meeting him alone and not beginning a romantic relationship again. Pt states her mother "feels she is slipping back by talking to Amanda Wilson again." Pt states she recently had another female disclose that he loves her though he lives in [place]. Pt becomes tearful when she discusses her feelings of "unworthiness" and how she can not imagine herself as happy.  pt states her medications appear to be working well.  Suicidal/Homicidal: Nowithout intent/plan  Therapist Response: Counselor used open questions, empathy, reflection, and validation. Counselor used active listening. Counselor challenged pt's negative self talk and helped pt identify her negative core belief of "unworthy of love and belonging".   Plan: Return again in 2 weeks.  Diagnosis:    ICD-10-CM   1. Severe episode of recurrent major depressive disorder, without psychotic features ([place]) F33.2   2. Generalized anxiety disorder F41.Gifford Cache Bills, LCAS-A 06/24/2017

## 2017-07-11 ENCOUNTER — Encounter: Payer: Self-pay | Admitting: Nurse Practitioner

## 2017-07-19 ENCOUNTER — Ambulatory Visit (INDEPENDENT_AMBULATORY_CARE_PROVIDER_SITE_OTHER): Payer: 59 | Admitting: Licensed Clinical Social Worker

## 2017-07-19 ENCOUNTER — Encounter

## 2017-07-19 DIAGNOSIS — F332 Major depressive disorder, recurrent severe without psychotic features: Secondary | ICD-10-CM

## 2017-07-20 ENCOUNTER — Encounter (HOSPITAL_COMMUNITY): Payer: Self-pay | Admitting: Licensed Clinical Social Worker

## 2017-07-20 NOTE — Progress Notes (Signed)
   THERAPIST PROGRESS NOTE  Session Time: 9-10  Participation Level: Active  Behavioral Response: Neat and Well GroomedAlertEuthymic  Type of Therapy: Individual Therapy  Treatment Goals addressed: Diagnosis: MDD  Interventions: CBT and Supportive  Summary: Amanda Wilson is a 27 y.o. female who presents with MDD and anxiety. She presents as active and engaged in session. She is upbeat, forthcoming, and dressed nicely. 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 She is working on house projects, Water quality scientist, and working often at her [employer] job, saving money. Pt reports she is not sleeping consistently and is getting about 4-5 hours during day. She occasionally has nightmares about aliens.   Counselor and pt discuss pt's conscious decision to reach back out to her ex-s/o who is a Ship broker at [employer]. Pt acknowledges they have a long, complicated, and toxic hx together but feels that "this time is different". Pt reports she has no expectations of him, she is not interested in a romantic relationship currently, and feels she is "doing what she needs to do" by reaching out to him on a casual basis. Pt admits that she talked w/ him for "around 24hours and got a lot out".   Suicidal/Homicidal: Nowithout intent/plan  Therapist Response: Counselor used open questions, active listening, MI OARS skills to draw out "change talk", empathic reflection. Counselor used CBT socratic questioning to help pt identify her motivation and reasoning for reaching out to her S/O. Counselor worked to help pt gain insight into how her bx are contributing to her feelings of inferiority.  Plan: Return again in 4 weeks.  Diagnosis:    ICD-10-CM   1. Severe episode of recurrent major depressive disorder,  without psychotic features [facility]) F33.2        Archie Balboa, LCAS-A 07/20/2017

## 2017-07-25 ENCOUNTER — Encounter: Payer: Self-pay | Admitting: Nurse Practitioner

## 2017-07-25 ENCOUNTER — Ambulatory Visit (INDEPENDENT_AMBULATORY_CARE_PROVIDER_SITE_OTHER): Payer: 59 | Admitting: Nurse Practitioner

## 2017-07-25 VITALS — BP 122/61 | HR 74 | Temp 98.6°F | Ht 64.0 in | Wt 289.4 lb

## 2017-07-25 DIAGNOSIS — Z842 Family history of other diseases of the genitourinary system: Secondary | ICD-10-CM

## 2017-07-25 DIAGNOSIS — N946 Dysmenorrhea, unspecified: Secondary | ICD-10-CM | POA: Diagnosis not present

## 2017-07-25 DIAGNOSIS — G43839 Menstrual migraine, intractable, without status migrainosus: Secondary | ICD-10-CM | POA: Diagnosis not present

## 2017-07-25 DIAGNOSIS — L509 Urticaria, unspecified: Secondary | ICD-10-CM

## 2017-07-25 MED ORDER — ETONOGESTREL-ETHINYL ESTRADIOL 0.12-0.015 MG/24HR VA RING: VAGINAL_RING | VAGINAL | 12 refills | Status: DC

## 2017-07-25 MED ORDER — SUMATRIPTAN SUCCINATE 50 MG PO TABS: 50.0000 mg | ORAL_TABLET | ORAL | 2 refills | Status: DC | PRN

## 2017-07-25 NOTE — Progress Notes (Signed)
Subjective:    Patient ID: Amanda Wilson, female    DOB: July 28, 1990, 27 y.o.   MRN: [id number]  Amanda Wilson is a 26 y.o. female presenting on 07/25/2017 for Migraine (severe headaches now up twice week. pt unsure if it's related to menstrual cycle ) and Pruritis (intermittent rash with whelps x1 mth . The rash normally starts in the hands. )   HPI Migraines Start in neck and up to occipital region, radiates to behind one eye.  Alternates sides for each migraine episode.  When she starts to feel this pain, occasionally has aura (black blind spots), Is taking excedrin and coffee.  Tries peppermint oil on back of neck and over forehead.  This doesn't help significantly.   Rash Patient has papular, itchy rash over arms/hands for last 1 month. Is not constant.  Started after initiation of hydroxyzine for anxiety/depression.  Dysmenorrhea Patient presents today also with concerns of very irregular menses.  She has heavy bleeding and period flow that last longer than 2 weeks.  She also has occasional missed periods.  Social History   Tobacco Use  . Smoking status: Never Smoker  . Smokeless tobacco: Never Used  Substance Use Topics  . Alcohol use: Yes    Comment: Occasional glass of wine  . Drug use: Yes    Types: Marijuana    Comment: Uses once a month for anxiety    Review of Systems Per HPI unless specifically indicated above     Objective:    BP 122/61 (BP Location: Right Arm, Patient Position: Sitting, Cuff Size: Large)   Pulse 74   Temp 98.6 F (37 C) (Oral)   Ht 5\' 4"  (1.626 m)   Wt 289 lb 6.4 oz (131.3 kg)   BMI 49.68 kg/m   Wt Readings from Last 3 Encounters:  07/25/17 289 lb 6.4 oz (131.3 kg)  04/29/17 285 lb 6.4 oz (129.5 kg)  02/18/17 288 lb 3.2 oz (130.7 kg)    Physical Exam  Constitutional: She is oriented to person, place, and time. She appears well-developed and well-nourished. No distress.  HENT:  Head: Normocephalic and atraumatic.  Eyes: Pupils  are equal, round, and reactive to light. EOM are normal.  Neck: Normal range of motion. Neck supple.  Cardiovascular: Normal rate, regular rhythm, S1 normal, S2 normal, normal heart sounds and intact distal pulses.  Pulmonary/Chest: Effort normal and breath sounds normal. No respiratory distress.  Neurological: She is alert and oriented to person, place, and time. She has normal strength and normal reflexes. No cranial nerve deficit or sensory deficit. She displays a negative Romberg sign. Gait normal.  Photophobia present today  Skin: Skin is warm and dry. Capillary refill takes less than 2 seconds. Rash (hives noted across arms and legs - flat papules) noted.  Psychiatric: She has a normal mood and affect. Her behavior is normal. Judgment and thought content normal.  Vitals reviewed.   Results for orders placed or performed in visit on 02/18/17  CBC with Differential/Platelet  Result Value Ref Range   WBC 7.2 3.4 - 10.8 x10E3/uL   RBC 4.86 3.77 - 5.28 x10E6/uL   Hemoglobin 12.2 11.1 - 15.9 g/dL   Hematocrit 35.3 34.0 - 46.6 %   MCV 73 (L) 79 - 97 fL   MCH 25.1 (L) 26.6 - 33.0 pg   MCHC 34.6 31.5 - 35.7 g/dL   RDW 14.9 12.3 - 15.4 %   Platelets 277 150 - 379 x10E3/uL   Neutrophils 62 Not Estab. %  Lymphs 31 Not Estab. %   Monocytes 6 Not Estab. %   Eos 1 Not Estab. %   Basos 0 Not Estab. %   Neutrophils Absolute 4.4 1.4 - 7.0 x10E3/uL   Lymphocytes Absolute 2.2 0.7 - 3.1 x10E3/uL   Monocytes Absolute 0.5 0.1 - 0.9 x10E3/uL   EOS (ABSOLUTE) 0.1 0.0 - 0.4 x10E3/uL   Basophils Absolute 0.0 0.0 - 0.2 x10E3/uL   Immature Granulocytes 0 Not Estab. %   Immature Grans (Abs) 0.0 0.0 - 0.1 x10E3/uL  Comprehensive metabolic panel  Result Value Ref Range   Glucose 91 65 - 99 mg/dL   BUN 10 6 - 20 mg/dL   Creatinine, Ser 0.61 0.57 - 1.00 mg/dL   GFR calc non Af Amer 126 >59 mL/min/1.73   GFR calc Af Amer 146 >59 mL/min/1.73   BUN/Creatinine Ratio 16 9 - 23   Sodium 143 134 - 144  mmol/L   Potassium 4.5 3.5 - 5.2 mmol/L   Chloride 103 96 - 106 mmol/L   CO2 22 20 - 29 mmol/L   Calcium 9.4 8.7 - 10.2 mg/dL   Total Protein 7.6 6.0 - 8.5 g/dL   Albumin 4.2 3.5 - 5.5 g/dL   Globulin, Total 3.4 1.5 - 4.5 g/dL   Albumin/Globulin Ratio 1.2 1.2 - 2.2   Bilirubin Total 0.3 0.0 - 1.2 mg/dL   Alkaline Phosphatase 64 39 - 117 IU/L   AST 11 0 - 40 IU/L   ALT 13 0 - 32 IU/L  POCT Urinalysis Dipstick  Result Value Ref Range   Color, UA yellow    Clarity, UA clear    Glucose, UA negative    Bilirubin, UA negative    Ketones, UA negative    Spec Grav, UA 1.025 1.010 - 1.025   Blood, UA negative    pH, UA 5.0 5.0 - 8.0   Protein, UA negative    Urobilinogen, UA 0.2 0.2 or 1.0 E.U./dL   Nitrite, UA negative    Leukocytes, UA Negative Negative   Appearance clear    Odor    POCT urine pregnancy  Result Value Ref Range   Preg Test, Ur Negative Negative      Assessment & Plan:   Problem List Items Addressed This Visit      Genitourinary   Dysmenorrhea   Relevant Medications   etonogestrel-ethinyl estradiol (NUVARING) 0.12-0.015 MG/24HR vaginal ring     Other   Family history of PCOS - Primary   Relevant Medications   etonogestrel-ethinyl estradiol (NUVARING) 0.12-0.015 MG/24HR vaginal ring   Patient with history of dysmenorrhea, family history of PCOS, patient with body habitus and presentation possibly consistent with PCOS.  May need GYN referral in future for management.  Encourage patient to start [place].  No concern for current pregnancy as patient has been abstinent from sexual activity.  Plan: 1. Discussed OCP, patch, ring, implanon, IUD (hormonal and copper) in detail w/ common side effects of each. 2. Pt prefers to start Nuvaring, may consider LARC in future 3. Educated on common side effects unique to nuvaring to include increased BV, yeast and those associated w/ all estrogen hormone use. 4. Followup as needed and in 1 year.   Other Visit Diagnoses     Intractable menstrual migraine without status migrainosus     Consistent with persistent migraine HA x 1 day.  History over last several months without improvement. - Currently without active HA, well-appearing, no focal neuro deficits, tolerating PO w/o n/v - Inadequately  treated for migraine HA at home  Plan: 1. Start abortive therapy with Sumatriptan 50mg  tabs - take 1 PRN (#12, 0 refill due to quantity limit), severe HA, may repeat dose within 2 hr if persistent, no more in 24 hours, in future can titrate dose to 100mg  if needed. Counseling on potential side effect / intolerance with chest discomfort acutely after taking sumatriptan 2. Recommend taking Ibuprofen 600-800mg  q 8 hr PRN, and can try Tylenol 1000mg  TID alternatively 3. Avoid triggers including foods, caffeine. Important to rest. - Discussion on future migraine prophylaxis medications - handout given, review options at next visit, determine need if using sumatriptan frequently 4. Start headache diary, handout given, identify triggers for avoidance, bring to next visit 5. Return criteria given for acute migraine, when to go to office vs ED   Relevant Medications   etonogestrel-ethinyl estradiol (NUVARING) 0.12-0.015 MG/24HR vaginal ring   SUMAtriptan (IMITREX) 50 MG tablet   Hives     Acute skin reaction, likely from new hydroxyzine.  STOP hydrozyzine.  Monitor for any new products/triggers that could be causing symptoms.  Follow-up 3 months       Meds ordered this encounter  Medications  . etonogestrel-ethinyl estradiol (NUVARING) 0.12-0.015 MG/24HR vaginal ring    Sig: Insert vaginally and leave in place for 3 consecutive weeks, then remove for 1 week.    Dispense:  1 each    Refill:  12    Order Specific Question:   Supervising Provider    Answer:   Olin Hauser [[id number]]  . SUMAtriptan (IMITREX) 50 MG tablet    Sig: Take 1 tablet (50 mg total) by mouth every 2 (two) hours as needed for migraine. May repeat in 2  hours if headache persists or recurs.    Dispense:  12 tablet    Refill:  2    Order Specific Question:   Supervising Provider    Answer:   Olin Hauser [[id number]]    Follow up plan: Return in about 3 months (around 10/25/2017) for migraines.  [name], DNP, AGPCNP-BC Adult Gerontology Primary Care Nurse Practitioner [facility] 07/25/2017, 9:28 AM

## 2017-07-25 NOTE — Patient Instructions (Addendum)
Amanda Wilson,   Thank you for coming in to clinic today.  1. Start sumatriptan 50 mg every 2 hours x 3 doses max for migraine.  2. START nuvaring ASAP.  If you do not get it before Wednesday, wait until your next cycle.  Then start on Sunday during or immediately after your period. - Call your local GYN for discussion about IUD and migraines.  3. For itching: this could be a side effect of hydroxyzine.  Call your psychiatrist and ask about stopping. - Take Pepcid twice daily as needed - May also take Claritin or loratadine 10 mg once daily if you have stopped hydroxyzine.  Take both for 7-14 days then as needed.   Please schedule a follow-up appointment with [name], AGNP. Return in about 3 months (around 10/25/2017) for migraines.   If you have any other questions or concerns, please feel free to call the clinic or send a message through [place]. You may also schedule an earlier appointment if necessary.  You will receive a survey after today's visit either digitally by e-mail or paper by [employer]. Your experiences and feedback matter to [place].  Please respond so we know how we are doing as we provide care for you.   [name], DNP, AGNP-BC Adult Gerontology Nurse Practitioner [place]   Intrauterine Device Information An intrauterine device (IUD) is inserted into your uterus to prevent pregnancy. There are two types of IUDs available:  Copper IUD-This type of IUD is wrapped in copper wire and is placed inside the uterus. Copper makes the uterus and fallopian tubes produce a fluid that kills sperm. The copper IUD can stay in place for 10 years.  Hormone IUD-This type of IUD contains the hormone progestin (synthetic progesterone). The hormone thickens the cervical mucus and prevents sperm from entering the uterus. It also thins the uterine lining to prevent implantation of a fertilized egg. The hormone can weaken or kill the sperm that get into  the uterus. One type of hormone IUD can stay in place for 5 years, and another type can stay in place for 3 years.  Your health care provider will make sure you are a good candidate for a contraceptive IUD. Discuss with your health care provider the possible side effects. Advantages of an intrauterine device  IUDs are highly effective, reversible, long acting, and low maintenance.  There are no estrogen-related side effects.  An IUD can be used when breastfeeding.  IUDs are not associated with weight gain.  The copper IUD works immediately after insertion.  The hormone IUD works right away if inserted within 7 days of your period starting. You will need to use a backup method of birth control for 7 days if the hormone IUD is inserted at any other time in your cycle.  The copper IUD does not interfere with your female hormones.  The hormone IUD can make heavy menstrual periods lighter and decrease cramping.  The hormone IUD can be used for 3 or 5 years.  The copper IUD can be used for 10 years. Disadvantages of an intrauterine device  The hormone IUD can be associated with irregular bleeding patterns.  The copper IUD can make your menstrual flow heavier and more painful.  You may experience cramping and vaginal bleeding after insertion. This information is not intended to replace advice given to you by your health care provider. Make sure you discuss any questions you have with your health care provider. Document Released: 12/16/2003  Document Revised: 06/19/2015 Document Reviewed: 07/02/2012 Elsevier Interactive Patient Education  2017 [employer].

## 2017-08-03 ENCOUNTER — Ambulatory Visit (INDEPENDENT_AMBULATORY_CARE_PROVIDER_SITE_OTHER): Payer: 59 | Admitting: Licensed Clinical Social Worker

## 2017-08-03 DIAGNOSIS — F332 Major depressive disorder, recurrent severe without psychotic features: Secondary | ICD-10-CM | POA: Diagnosis not present

## 2017-08-05 ENCOUNTER — Encounter (HOSPITAL_COMMUNITY): Payer: Self-pay | Admitting: Licensed Clinical Social Worker

## 2017-08-05 NOTE — Progress Notes (Signed)
   THERAPIST PROGRESS NOTE  Session Time: 2-3  Participation Level: Active  Behavioral Response: Neat and Well GroomedAlertEuthymic  Type of Therapy: Individual Therapy  Treatment Goals addressed: MDD  Interventions: CBT, Strength-based and Supportive  Summary: Amanda Wilson is a 27 y.o. female who presents with MDD and hx of anxiety. 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 Pt states she accidentally went off her anti depressant for 2 weeks and has returned to normal dosage as of 3 days ago. She continues to have bad migranes. She has started a new birth control and believes it is helpful in regulating her mood and hormones. She started taking Valarian Root for sleep, as recommended by this writer, and is having at least 6 hrs of restful sleep for over 1 week. Her work was closed for 1 week in June due to [place] vacation which disrupted her routine. Counselor spent time validating pt's discouragment and reframing her distress as "a sign that her new self-care patterns were obviously effective". Pt agreed and is hoping to get back on track soon. She recently got a "happy planer" which is helping her stay organized. Pt reported she is practicing mindfulness of her anxious thoughts such as "I should be worried about this thing I can't control" and is learning to "catch them and stop them before they get overwhelming". I spent time encouraging her and congratulating her on the hard work of changing patterns.  Suicidal/Homicidal: Nowithout intent/plan  Therapist Response: Counselor used open questions, active listening, person-centered reflection. Counselor assessed pt's level of functioning and reframed discouragement as "signs of progress" and normalized ups and downs in recovery from depression. Pt continues to increase her tolerability of  depression and anxiety. Counselor emphasized pt's use of mindfulness as a helpful coping skill.  Plan: Return again in 2 weeks.  Diagnosis:    ICD-10-CM   1. Severe episode of recurrent major depressive disorder, without psychotic features [facility]) F33.2      Archie Balboa, LCAS-A 08/05/2017

## 2017-08-08 ENCOUNTER — Ambulatory Visit (HOSPITAL_COMMUNITY): Payer: Self-pay | Admitting: Psychiatry

## 2017-08-17 ENCOUNTER — Ambulatory Visit (INDEPENDENT_AMBULATORY_CARE_PROVIDER_SITE_OTHER): Payer: 59 | Admitting: Licensed Clinical Social Worker

## 2017-08-17 ENCOUNTER — Encounter (HOSPITAL_COMMUNITY): Payer: Self-pay | Admitting: Licensed Clinical Social Worker

## 2017-08-17 DIAGNOSIS — F411 Generalized anxiety disorder: Secondary | ICD-10-CM | POA: Diagnosis not present

## 2017-08-17 DIAGNOSIS — F3341 Major depressive disorder, recurrent, in partial remission: Secondary | ICD-10-CM

## 2017-08-17 NOTE — Progress Notes (Signed)
   THERAPIST PROGRESS NOTE  Session Time: 2-3  Participation Level: Active  Behavioral Response: Casual and Fairly GroomedAlertAnxious and Euthymic  Type of Therapy: Individual Therapy  Treatment Goals addressed: Anxiety  Interventions: CBT, Supportive and Reframing  Summary: Amanda Wilson is a 27 y.o. female who presents with hx of MDD and GAD. She states she has been taking her medications as px again. She is experiencing a recent increase in anxiety but admits that it is normal anxiety for starting grad school next month. She is tolerating challenges to her plan and avoiding perfectionistic thinking. She is somewhat agitated in session but admits she is mostly excited about starting school again. Counselor spends time asking pt to discuss the ways that this school experience will be different than her previous grad school experience, which ended in a pre-mature medical withdrawal due to depression. Pt verbalized stronger social support, better emotional stability, better coping skills. Pt expressed her needs of social support and wants to get involved w/ walking and/or exercising w/ friends this Fall. Counselor spent time redirecting pt's negative meaning-making into ways to use her coping skills.  Suicidal/Homicidal: Nowithout intent/plan  Therapist Response: Counselor used open questions, socratic questioning of "what falling apart would mean", targeting of negative core beliefs and challenging them through new interpretations. Counselor assessed pt medication compliance. Counselor assessed pt level of functioning. Pt appears to have gained significant insight into her perfectionistic, obsessive, and depressed thinking styles.   Plan: Return again in 2 weeks.  Diagnosis:    ICD-10-CM   1. Generalized anxiety disorder F41.1   2. Recurrent major depressive disorder, in partial remission [facility]) F33.41        Archie Balboa, LCAS-A 08/17/2017

## 2017-09-21 ENCOUNTER — Telehealth (HOSPITAL_COMMUNITY): Payer: Self-pay

## 2017-09-21 NOTE — Telephone Encounter (Signed)
Patient called for a refill on medication. Patient was last seen in January. I transferred her to the front dest for a follow up appointment, but patient states she needs medication. Please review and advise, thank you

## 2017-09-21 NOTE — Telephone Encounter (Signed)
She is seeing Dr. De Nurse this weekend and he can determine if he wishes to restart medicines this saturday

## 2017-09-24 ENCOUNTER — Encounter (HOSPITAL_COMMUNITY): Payer: Self-pay | Admitting: Psychiatry

## 2017-09-24 ENCOUNTER — Ambulatory Visit (INDEPENDENT_AMBULATORY_CARE_PROVIDER_SITE_OTHER): Payer: 59 | Admitting: Psychiatry

## 2017-09-24 VITALS — BP 94/67 | HR 100 | Ht 64.0 in | Wt 294.0 lb

## 2017-09-24 DIAGNOSIS — Z818 Family history of other mental and behavioral disorders: Secondary | ICD-10-CM | POA: Diagnosis not present

## 2017-09-24 DIAGNOSIS — F1290 Cannabis use, unspecified, uncomplicated: Secondary | ICD-10-CM

## 2017-09-24 DIAGNOSIS — F3342 Major depressive disorder, recurrent, in full remission: Secondary | ICD-10-CM

## 2017-09-24 DIAGNOSIS — F3341 Major depressive disorder, recurrent, in partial remission: Secondary | ICD-10-CM | POA: Diagnosis not present

## 2017-09-24 DIAGNOSIS — F332 Major depressive disorder, recurrent severe without psychotic features: Secondary | ICD-10-CM

## 2017-09-24 DIAGNOSIS — Z810 Family history of intellectual disabilities: Secondary | ICD-10-CM | POA: Diagnosis not present

## 2017-09-24 DIAGNOSIS — F411 Generalized anxiety disorder: Secondary | ICD-10-CM

## 2017-09-24 MED ORDER — FLUOXETINE HCL 40 MG PO CAPS: 40.0000 mg | ORAL_CAPSULE | Freq: Every day | ORAL | 0 refills | Status: DC

## 2017-09-24 NOTE — Progress Notes (Signed)
BH MD/PA/NP OP Progress Note  09/24/2017 11:44 AM Amanda Wilson  MRN:  [id number]  Chief Complaint:  HPI:  27 years old single White female last seen by Daron Offer January, lexapro was changed to prozac that helped last visit for depression  Says had to see in July but for some reason it was cancelled  wants to continue prozac since it is helping depression and anxiety  Denies drug use  anxiety better on prozac  starting [employer] has a roommate Less worriful No psychosis Feels prozac helped confident and depression   Visit Diagnosis:    ICD-10-CM   1. Recurrent major depressive disorder, in partial remission ([place]) F33.41   2. Severe episode of recurrent major depressive disorder, without psychotic features ([place]) F33.2     Past Psychiatric History: see chart  Past Medical History:  Past Medical History:  Diagnosis Date  . Anxiety   . Depression   . Frequent headaches    History reviewed. No pertinent surgical history.  Family Psychiatric History: father depression  Family History:  Family History  Problem Relation Age of Onset  . Cancer Mother        skin  . Depression Mother   . Heart disease Father   . Stroke Father   . Diabetes Father   . Depression Sister   . Depression Maternal Aunt   . Dementia Maternal Grandfather   . Depression Cousin     Social History:  Social History   Socioeconomic History  . Marital status: Single    Spouse name: Not on file  . Number of children: Not on file  . Years of education: Not on file  . Highest education level: Not on file  Occupational History  . Not on file  Social Needs  . Financial resource strain: Not on file  . Food insecurity:    Worry: Not on file    Inability: Not on file  . Transportation needs:    Medical: Not on file    Non-medical: Not on file  Tobacco Use  . Smoking status: Never Smoker  . Smokeless tobacco: Never Used  Substance and Sexual Activity  . Alcohol use: Not Currently  . Drug use:  Not Currently    Types: Marijuana  . Sexual activity: Yes    Partners: Male    Birth control/protection: Pill  Lifestyle  . Physical activity:    Days per week: Not on file    Minutes per session: Not on file  . Stress: Not on file  Relationships  . Social connections:    Talks on phone: Not on file    Gets together: Not on file    Attends religious service: Not on file    Active member of club or organization: Not on file    Attends meetings of clubs or organizations: Not on file    Relationship status: Not on file  Other Topics Concern  . Not on file  Social History Narrative  . Not on file    Allergies:  Allergies  Allergen Reactions  . Duricef [Cefadroxil]     Skin sloughing  . Cephalosporins Rash  . Latex Rash    Metabolic Disorder Labs: No results found for: HGBA1C, MPG No results found for: PROLACTIN No results found for: CHOL, TRIG, HDL, CHOLHDL, VLDL, LDLCALC No results found for: TSH  Therapeutic Level Labs: No results found for: LITHIUM No results found for: VALPROATE No components found for:  CBMZ  Current Medications: Current Outpatient Medications  Medication Sig Dispense Refill  . etonogestrel-ethinyl estradiol (NUVARING) 0.12-0.015 MG/24HR vaginal ring Insert vaginally and leave in place for 3 consecutive weeks, then remove for 1 week. 1 each 12  . FLUoxetine (PROZAC) 40 MG capsule Take 1 capsule (40 mg total) by mouth daily. 90 capsule 1  . hydrOXYzine (VISTARIL) 25 MG capsule Use 1-2 tablets daily for sleep or anxiety 120 capsule 3  . Multiple Vitamin (MULTIVITAMIN) tablet Take 1 tablet by mouth daily.    . SUMAtriptan (IMITREX) 50 MG tablet Take 1 tablet (50 mg total) by mouth every 2 (two) hours as needed for migraine. May repeat in 2 hours if headache persists or recurs. 12 tablet 2   No current facility-administered medications for this visit.      Musculoskeletal: Strength & Muscle Tone: within normal limits Gait & Station:  normal Patient leans: no lean  Psychiatric Specialty Exam: Review of Systems  Cardiovascular: Negative for chest pain.  Skin: Negative for rash.  Neurological: Negative for tremors.    Blood pressure 94/67, pulse 100, height 5\' 4"  (1.626 m), weight 294 lb (133.4 kg).Body mass index is 50.46 kg/m.  General Appearance: Casual  Eye Contact:  Good  Speech:  Clear and Coherent  Volume:  Normal  Mood:  Euthymic  Affect:  Congruent  Thought Process:  Goal Directed  Orientation:  Full (Time, Place, and Person)  Thought Content: Rumination   Suicidal Thoughts:  No  Homicidal Thoughts:  No  Memory:  Immediate;   Good Recent;   Fair  Judgement:  Good  Insight:  Good  Psychomotor Activity:  Normal  Concentration:  Concentration: Good and Attention Span: Good  Recall:  Good  Fund of Knowledge: Fair  Language: Good  Akathisia:  No  Handed:  Right  AIMS (if indicated): not done  Assets:  Desire for Improvement  ADL's:  Intact  Cognition: WNL  Sleep:  Fair   Screenings: GAD-7     Office Visit from 07/25/2017 in [facility] Office Visit from 08/31/2016 in [facility] Office Visit from 07/29/2016 in [facility] Office Visit from 06/22/2016 in [facility]: [place] Rhonda W  Total GAD-7 Score  3  8  14  16     PHQ2-9     Office Visit from 07/25/2017 in [facility] Office Visit from 10/28/2016 in [facility] Office Visit from 09/28/2016 in [facility] Office Visit from 08/31/2016 in [facility] Office Visit from 07/29/2016 in [place]  PHQ-2 Total Score  0  0  2  3  2   PHQ-9 Total Score  5  7  10  10  8        Assessment and Plan: MDD recurrent moderate: improved on prozac  GAD: improved on prozac 40mg   Continue vistaril has meds  Reviewed meds, questions addressed    Merian Capron, MD 09/24/2017, 11:44 AM

## 2017-10-24 ENCOUNTER — Encounter: Payer: Self-pay | Admitting: Nurse Practitioner

## 2017-11-01 ENCOUNTER — Encounter (HOSPITAL_COMMUNITY): Payer: Self-pay | Admitting: Licensed Clinical Social Worker

## 2017-11-01 ENCOUNTER — Ambulatory Visit (INDEPENDENT_AMBULATORY_CARE_PROVIDER_SITE_OTHER): Payer: 59 | Admitting: Licensed Clinical Social Worker

## 2017-11-01 DIAGNOSIS — F411 Generalized anxiety disorder: Secondary | ICD-10-CM | POA: Diagnosis not present

## 2017-11-01 NOTE — Progress Notes (Signed)
   THERAPIST PROGRESS NOTE  Session Time: 2-3  Participation Level: Active  Behavioral Response: NeatAlertEuthymic  Type of Therapy: Individual Therapy  Treatment Goals addressed: Anxiety  Interventions: CBT and Supportive  Summary: Amanda Wilson is a 27 y.o. female who presents with GAD and hx of MDD.  "Things are going well but I keep thinking that these good feelings are going to slip away and I'll go back to suicidal thinking."  Pt was active, engaged, loquacious, and tearful throughout session. She reported her mood has been up but not manic. She is happy that she is waking up and not dreading each day. 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 She states she has never been this "calm and collected" in front of her friends. Counselor and pt discuss her relationships w/ roomates, old friends who are toxic, and romantic relationship w/ her ex-SO who is still in contact w/ her.   Suicidal/Homicidal: Nowithout intent/plan  Therapist Response: Counselor used open questions and CBT Socratic questioning to help pt gain insight into her obsessive thinking about fear of returning to depression. Pt expresses herself eloquently and admits that her depression will "always be with her but feel more manageable than ever before."  Plan: Return again in 6 weeks.  Diagnosis:    ICD-10-CM   1. Generalized anxiety disorder F41.[place], LCAS-A 11/01/2017

## 2017-11-15 ENCOUNTER — Ambulatory Visit (HOSPITAL_COMMUNITY): Payer: Self-pay | Admitting: Licensed Clinical Social Worker

## 2017-11-29 ENCOUNTER — Ambulatory Visit (INDEPENDENT_AMBULATORY_CARE_PROVIDER_SITE_OTHER): Payer: 59 | Admitting: Licensed Clinical Social Worker

## 2017-11-29 DIAGNOSIS — F411 Generalized anxiety disorder: Secondary | ICD-10-CM | POA: Diagnosis not present

## 2017-11-30 ENCOUNTER — Encounter (HOSPITAL_COMMUNITY): Payer: Self-pay | Admitting: Licensed Clinical Social Worker

## 2017-11-30 NOTE — Progress Notes (Signed)
   THERAPIST PROGRESS NOTE  Session Time: 4-5  Participation Level: Active  Behavioral Response: Casual and NeatAlertEuthymic  Type of Therapy: Individual Therapy  Treatment Goals addressed: Anxiety  Interventions: CBT  Summary: Amanda Wilson is a 27 y.o. female who presents with GAD. She states her mood has been up and down though she has been able to tolerate her depressed days better. She reports she is gaining new insights and learning more about herself in her counseling program at [facility]. She discusses her recent feelings about the 58yr anniversary of her father's sudden death. Pt explores her feelings and memories associated w/ learning of her father's death while she was in college. Pt states she had a recent argument w/ her mother about her feelings of grief and told her mother that she is allowed to feel however she feels. Counselor spent time encouraging and congratulating pt on her bx changes and reduction in sxs.    Suicidal/Homicidal: Nowithout intent/plan  Therapist Response: Counselor used open questions, active listening, and verbal affirmation. Pt reports increased distress tolerance and higher acceptance of her mood lability and depressed feelings. Pt appears to be gaining deeper psychological insights into herself and her triggers. She admits that she is frustrated when she is around couples who are overly affectionate because it makes her think of her loneliness. Pt appeared to accept this loneliness and acknowledge that it will not be forever.   Plan: Return again in 4 weeks.  Diagnosis:    ICD-10-CM   1. Generalized anxiety disorder F41.[place], LCAS-A 11/30/2017

## 2017-12-10 ENCOUNTER — Other Ambulatory Visit: Payer: Self-pay

## 2017-12-10 ENCOUNTER — Ambulatory Visit (INDEPENDENT_AMBULATORY_CARE_PROVIDER_SITE_OTHER): Payer: 59 | Admitting: Psychiatry

## 2017-12-10 ENCOUNTER — Encounter (HOSPITAL_COMMUNITY): Payer: Self-pay | Admitting: Psychiatry

## 2017-12-10 VITALS — BP 118/80 | Wt 296.8 lb

## 2017-12-10 DIAGNOSIS — F411 Generalized anxiety disorder: Secondary | ICD-10-CM

## 2017-12-10 DIAGNOSIS — F3341 Major depressive disorder, recurrent, in partial remission: Secondary | ICD-10-CM | POA: Diagnosis not present

## 2017-12-10 DIAGNOSIS — F332 Major depressive disorder, recurrent severe without psychotic features: Secondary | ICD-10-CM

## 2017-12-10 MED ORDER — FLUOXETINE HCL 40 MG PO CAPS: 40.0000 mg | ORAL_CAPSULE | Freq: Every day | ORAL | 0 refills | Status: DC

## 2017-12-10 NOTE — Patient Instructions (Signed)
Refer to sleep apnea study. Rule out OSA

## 2017-12-10 NOTE — Progress Notes (Signed)
BH MD/PA/NP OP Progress Note  12/10/2017 9:38 AM Amanda Wilson  MRN:  [id number]  Chief Complaint:  Chief Complaint    Follow-up; Medication Refill; Insomnia     HPI:  27 years old single White female last seen by [facility] January, lexapro was changed to prozac that helped last visit for depression  Doing fair on meds. prozac helping depression, anxiety Goes in therapy helps to work on distraction from negative toughts.  No suicidality Tired n the morning, also snores and feels sleep unrested  Denies drug use  anxiety better on prozac  starting [employer] has a roommate Less worriful No psychosis    Visit Diagnosis:    ICD-10-CM   1. Recurrent major depressive disorder, in partial remission ([place]) F33.41   2. Generalized anxiety disorder F41.1   3. Severe episode of recurrent major depressive disorder, without psychotic features ([place]) F33.2 FLUoxetine (PROZAC) 40 MG capsule    Past Psychiatric History: see chart  Past Medical History:  Past Medical History:  Diagnosis Date  . Anxiety   . Depression   . Frequent headaches    No past surgical history on file.  Family Psychiatric History: father depression  Family History:  Family History  Problem Relation Age of Onset  . Cancer Mother        skin  . Depression Mother   . Heart disease Father   . Stroke Father   . Diabetes Father   . Depression Sister   . Depression Maternal Aunt   . Dementia Maternal Grandfather   . Depression Cousin     Social History:  Social History   Socioeconomic History  . Marital status: Single    Spouse name: Not on file  . Number of children: Not on file  . Years of education: Not on file  . Highest education level: Not on file  Occupational History  . Not on file  Social Needs  . Financial resource strain: Not hard at all  . Food insecurity:    Worry: Never true    Inability: Never true  . Transportation needs:    Medical: No    Non-medical: No  Tobacco Use  . Smoking  status: Never Smoker  . Smokeless tobacco: Never Used  Substance and Sexual Activity  . Alcohol use: Not Currently  . Drug use: Not Currently    Types: Marijuana  . Sexual activity: Yes    Partners: Male    Birth control/protection: Pill  Lifestyle  . Physical activity:    Days per week: 0 days    Minutes per session: 0 min  . Stress: Very much  Relationships  . Social connections:    Talks on phone: Not on file    Gets together: Not on file    Attends religious service: Never    Active member of club or organization: No    Attends meetings of clubs or organizations: Never    Relationship status: Never married  Other Topics Concern  . Not on file  Social History Narrative  . Not on file    Allergies:  Allergies  Allergen Reactions  . Duricef [Cefadroxil]     Skin sloughing  . Cephalosporins Rash  . Latex Rash    Metabolic Disorder Labs: No results found for: HGBA1C, MPG No results found for: PROLACTIN No results found for: CHOL, TRIG, HDL, CHOLHDL, VLDL, LDLCALC No results found for: TSH  Therapeutic Level Labs: No results found for: LITHIUM No results found for: VALPROATE No components  found for:  CBMZ  Current Medications: Current Outpatient Medications  Medication Sig Dispense Refill  . etonogestrel-ethinyl estradiol (NUVARING) 0.12-0.015 MG/24HR vaginal ring Insert vaginally and leave in place for 3 consecutive weeks, then remove for 1 week. 1 each 12  . FLUoxetine (PROZAC) 40 MG capsule Take 1 capsule (40 mg total) by mouth daily. 90 capsule 0  . hydrOXYzine (VISTARIL) 25 MG capsule Use 1-2 tablets daily for sleep or anxiety 120 capsule 3  . Multiple Vitamin (MULTIVITAMIN) tablet Take 1 tablet by mouth daily.     No current facility-administered medications for this visit.      Musculoskeletal: Strength & Muscle Tone: within normal limits Gait & Station: normal Patient leans: no lean  Psychiatric Specialty Exam: Review of Systems   Constitutional: Positive for malaise/fatigue.  Cardiovascular: Negative for chest pain.  Skin: Negative for rash.  Neurological: Negative for tremors.    Blood pressure 118/80, weight 296 lb 12.8 oz (134.6 kg).Body mass index is 50.95 kg/m.  General Appearance: Casual  Eye Contact:  Good  Speech:  Clear and Coherent  Volume:  Normal  Mood:  fair  Affect:  Congruent  Thought Process:  Goal Directed  Orientation:  Full (Time, Place, and Person)  Thought Content: Rumination   Suicidal Thoughts:  No  Homicidal Thoughts:  No  Memory:  Immediate;   Good Recent;   Fair  Judgement:  Good  Insight:  Good  Psychomotor Activity:  Normal  Concentration:  Concentration: Good and Attention Span: Good  Recall:  Good  Fund of Knowledge: Fair  Language: Good  Akathisia:  No  Handed:  Right  AIMS (if indicated): not done  Assets:  Desire for Improvement  ADL's:  Intact  Cognition: WNL  Sleep:  Fair   Screenings: GAD-7     Office Visit from 07/25/2017 in [facility] Office Visit from 08/31/2016 in [facility] Office Visit from 07/29/2016 in [facility] Office Visit from 06/22/2016 in [facility]  Total GAD-7 Score  3  8  14  16     PHQ2-9     Office Visit from 07/25/2017 in [facility] Office Visit from 10/28/2016 in [facility] Office Visit from 09/28/2016 in [facility] Office Visit from 08/31/2016 in [facility] Office Visit from 07/29/2016 in [place]  PHQ-2 Total Score  0  0  2  3  2   PHQ-9 Total Score  5  7  10  10  8        Assessment and Plan: MDD recurrent moderate:doing better. Continue prozac and therapy  GAD: improved on prozac 40mg   Refill sent Fatigue and tiredness: refer to sleep study. Patient to call back for contact numbers on Monday and can send referral request from here or primary care office   Reviewed meds, questions addressed FU  41m   Merian Capron, MD 12/10/2017, 9:38 AM

## 2017-12-14 ENCOUNTER — Ambulatory Visit (HOSPITAL_COMMUNITY): Payer: 59 | Admitting: Licensed Clinical Social Worker

## 2017-12-28 ENCOUNTER — Ambulatory Visit (HOSPITAL_COMMUNITY): Payer: 59 | Admitting: Licensed Clinical Social Worker

## 2018-01-11 ENCOUNTER — Encounter

## 2018-01-11 ENCOUNTER — Ambulatory Visit (INDEPENDENT_AMBULATORY_CARE_PROVIDER_SITE_OTHER): Payer: 59 | Admitting: Licensed Clinical Social Worker

## 2018-01-11 DIAGNOSIS — F3341 Major depressive disorder, recurrent, in partial remission: Secondary | ICD-10-CM

## 2018-01-13 ENCOUNTER — Encounter (HOSPITAL_COMMUNITY): Payer: Self-pay | Admitting: Licensed Clinical Social Worker

## 2018-01-13 NOTE — Progress Notes (Signed)
   THERAPIST PROGRESS NOTE  Session Time: 2-3pm  Participation Level: Active  Behavioral Response: Well GroomedAlertDepressed  Type of Therapy: Individual Therapy  Treatment Goals addressed: Diagnosis: MDD  Interventions: CBT and Supportive  Summary: Amanda Wilson is a 27 y.o. female who presents with hx of MDD.  Pt was active, engaged, talkative, and tearful in session. She was clear, lucid, and described her hx of negating her feelings after being told that she was "being too emotional about her father's death while crying in the dorm room". Pt and counselor worked on validating pt's tearfulness and distinguishing grief from depression.   Suicidal/Homicidal: Nowithout intent/plan  Therapist Response: Counselor used open questions, active listening, and support. Counselor used emotion-focused techniques to slow pt's processing down, connect her better to her physiological experience of emotion, and validate her emotions in session.   Plan: Return again in 4 weeks.  Diagnosis:    ICD-10-CM   1. Recurrent major depressive disorder, in partial remission [facility]) F33.41       Archie Balboa, LCAS-A 01/13/2018

## 2018-01-27 ENCOUNTER — Ambulatory Visit (HOSPITAL_COMMUNITY): Payer: 59 | Admitting: Licensed Clinical Social Worker

## 2018-02-09 ENCOUNTER — Ambulatory Visit: Payer: 59 | Admitting: Nurse Practitioner

## 2018-02-09 ENCOUNTER — Encounter: Payer: Self-pay | Admitting: Nurse Practitioner

## 2018-02-09 ENCOUNTER — Other Ambulatory Visit: Payer: Self-pay | Admitting: Nurse Practitioner

## 2018-02-09 VITALS — BP 119/85 | HR 86 | Temp 99.0°F | Ht 64.0 in | Wt 305.4 lb

## 2018-02-09 DIAGNOSIS — M549 Dorsalgia, unspecified: Secondary | ICD-10-CM

## 2018-02-09 DIAGNOSIS — R300 Dysuria: Secondary | ICD-10-CM

## 2018-02-09 LAB — POCT URINALYSIS DIPSTICK
Bilirubin, UA: NEGATIVE
Blood, UA: NEGATIVE
Glucose, UA: NEGATIVE
Ketones, UA: NEGATIVE
Leukocytes, UA: NEGATIVE
Nitrite, UA: NEGATIVE
Protein, UA: NEGATIVE
Spec Grav, UA: >=1.03 — AB (ref 1.010–1.025)
Urobilinogen, UA: 0.2 E.U./dL
pH, UA: 5 (ref 5.0–8.0)

## 2018-02-09 LAB — CBC WITH DIFFERENTIAL/PLATELET
Basophils Absolute: 0 10*3/uL (ref 0.0–0.2)
Basos: 0 %
EOS (ABSOLUTE): 0.1 10*3/uL (ref 0.0–0.4)
Eos: 1 %
Hematocrit: 35.9 % (ref 34.0–46.6)
Hemoglobin: 11.3 g/dL (ref 11.1–15.9)
Immature Grans (Abs): 0 10*3/uL (ref 0.0–0.1)
Immature Granulocytes: 0 %
Lymphocytes Absolute: 2.2 10*3/uL (ref 0.7–3.1)
Lymphs: 23 %
MCH: 24.2 pg — ABNORMAL LOW (ref 26.6–33.0)
MCHC: 31.5 g/dL (ref 31.5–35.7)
MCV: 77 fL — ABNORMAL LOW (ref 79–97)
Monocytes Absolute: 0.5 10*3/uL (ref 0.1–0.9)
Monocytes: 6 %
Neutrophils Absolute: 6.4 10*3/uL (ref 1.4–7.0)
Neutrophils: 70 %
Platelets: 288 10*3/uL (ref 150–450)
RBC: 4.67 x10E6/uL (ref 3.77–5.28)
RDW: 14.3 % (ref 11.7–15.4)
WBC: 9.2 10*3/uL (ref 3.4–10.8)

## 2018-02-09 LAB — COMPREHENSIVE METABOLIC PANEL
ALT: 8 IU/L (ref 0–32)
AST: 13 IU/L (ref 0–40)
Albumin/Globulin Ratio: 1.1 — ABNORMAL LOW (ref 1.2–2.2)
Albumin: 3.7 g/dL (ref 3.5–5.5)
Alkaline Phosphatase: 67 IU/L (ref 39–117)
BUN/Creatinine Ratio: 13 (ref 9–23)
BUN: 9 mg/dL (ref 6–20)
Bilirubin Total: 0.4 mg/dL (ref 0.0–1.2)
CO2: 20 mmol/L (ref 20–29)
Calcium: 9.1 mg/dL (ref 8.7–10.2)
Chloride: 103 mmol/L (ref 96–106)
Creatinine, Ser: 0.72 mg/dL (ref 0.57–1.00)
GFR calc Af Amer: 135 mL/min/{1.73_m2} (ref >59)
GFR calc non Af Amer: 117 mL/min/{1.73_m2} (ref >59)
Globulin, Total: 3.3 g/dL (ref 1.5–4.5)
Glucose: 96 mg/dL (ref 65–99)
Potassium: 4.2 mmol/L (ref 3.5–5.2)
Sodium: 139 mmol/L (ref 134–144)
Total Protein: 7 g/dL (ref 6.0–8.5)

## 2018-02-09 MED ORDER — OXYBUTYNIN CHLORIDE ER 5 MG PO TB24: 5.0000 mg | ORAL_TABLET | Freq: Every day | ORAL | 0 refills | Status: DC

## 2018-02-09 MED ORDER — NITROFURANTOIN MONOHYD MACRO 100 MG PO CAPS: 100.0000 mg | ORAL_CAPSULE | Freq: Two times a day (BID) | ORAL | 0 refills | Status: AC

## 2018-02-09 NOTE — Patient Instructions (Addendum)
[name],   Thank you for coming in to clinic today.  1. You most likely have urinary retention.  I am also looking at labs to check for kidney infection. - Wait to start your new antibiotic until we have your urine dipstick results. - take Macrobid 100 mg one tablet twice daily for 10 days.  2.  For bladder spasm and pain: - Start oxybutynin 5 mg once daily for at least 14 days, may continue 30 days.   - May continue over the counter AZO for bladder pain as well for 2 days.  If unable to void in > 12 hours, develop new fever, chills, or sweats please seek care at emergency room.  Please schedule a follow-up appointment with [name], AGNP. Return in 5-7 days if symptoms worsen or fail to improve.  If you have any other questions or concerns, please feel free to call the clinic or send a message through [place]. You may also schedule an earlier appointment if necessary.  You will receive a survey after today's visit either digitally by e-mail or paper by [employer]. Your experiences and feedback matter to [place].  Please respond so we know how we are doing as we provide care for you.   [name], DNP, AGNP-BC Adult Gerontology Nurse Practitioner [place]

## 2018-02-09 NOTE — Progress Notes (Signed)
Subjective:    Patient ID: Amanda Wilson, female    DOB: Jan 04, 1991, 28 y.o.   MRN: [id number]  Amanda Wilson is a 28 y.o. female presenting on 02/09/2018 for Abdominal Pain (lower abdominal pain, dysuria, urinary frequency and lower back pain x 1.5 weeks. The pt was diagnose w/ UTI x 2 weeks ago and put on abx (Levaquin), notice improvement then the pain return before finishing the abx.)  HPI Abdominal pain Patient presents today about 1.5 weeks after initial treatment of UTI.  Patient has had persistent symptoms.  She was seen at urgent care, treated with levaquin for initial antibiotic therapy for her UTI.  Patient then returned to urgent care yesterday and had negative urine dipstick.  She presents today because she had continued abdominal pain/pelvic pain with urinary symptoms.  Patient states she feels very full and lots of pelvic pressure.  New symptom of abdomen puffing out and back cannot become straight.  This only started 2 days ago and was primarily Right lower back.  Pain in bilateral groins when seated. - No blood noted in urine. - Mild fever at initial treatment 1.5 weeks ago.  Otherwise no fever, chills, or sweats, nausea, vomiting, or diarrhea.   Social History   Tobacco Use  . Smoking status: Never Smoker  . Smokeless tobacco: Never Used  Substance Use Topics  . Alcohol use: Not Currently  . Drug use: Not Currently    Types: Marijuana   Review of Systems Per HPI unless specifically indicated above    Objective:    BP 119/85 (BP Location: Right Arm, Patient Position: Sitting, Cuff Size: Large)   Pulse 86   Temp 99 F (37.2 C) (Oral)   Ht 5\' 4"  (1.626 m)   Wt (!) 305 lb 6.4 oz (138.5 kg)   LMP 01/31/2018   BMI 52.42 kg/m   Wt Readings from Last 3 Encounters:  02/09/18 (!) 305 lb 6.4 oz (138.5 kg)  07/25/17 289 lb 6.4 oz (131.3 kg)  04/29/17 285 lb 6.4 oz (129.5 kg)    Physical Exam Vitals signs reviewed.  Constitutional:      General: She is not in  acute distress.    Appearance: She is well-developed.  HENT:     Head: Normocephalic and atraumatic.  Cardiovascular:     Rate and Rhythm: Normal rate and regular rhythm.     Pulses:          Radial pulses are 2+ on the right side and 2+ on the left side.       Posterior tibial pulses are 1+ on the right side and 1+ on the left side.     Heart sounds: Normal heart sounds, S1 normal and S2 normal.  Pulmonary:     Effort: Pulmonary effort is normal. No respiratory distress.     Breath sounds: Normal breath sounds and air entry.  Abdominal:     General: Bowel sounds are normal. There is no distension.     Palpations: Abdomen is soft.     Tenderness: There is abdominal tenderness in the right lower quadrant, suprapubic area and left lower quadrant. There is right CVA tenderness and left CVA tenderness.     Hernia: No hernia is present.  Musculoskeletal:     Right lower leg: No edema.     Left lower leg: No edema.  Skin:    General: Skin is warm and dry.     Capillary Refill: Capillary refill takes less than 2 seconds.  Neurological:  Mental Status: She is alert and oriented to person, place, and time.  Psychiatric:        Attention and Perception: Attention normal.        Mood and Affect: Mood and affect normal.        Behavior: Behavior normal. Behavior is cooperative.    CVAT bilaterally, increased suprapubic pressure, lower abdominal pain.  Results for orders placed or performed in visit on 02/18/17  CBC with Differential/Platelet  Result Value Ref Range   WBC 7.2 3.4 - 10.8 x10E3/uL   RBC 4.86 3.77 - 5.28 x10E6/uL   Hemoglobin 12.2 11.1 - 15.9 g/dL   Hematocrit 35.3 34.0 - 46.6 %   MCV 73 (L) 79 - 97 fL   MCH 25.1 (L) 26.6 - 33.0 pg   MCHC 34.6 31.5 - 35.7 g/dL   RDW 14.9 12.3 - 15.4 %   Platelets 277 150 - 379 x10E3/uL   Neutrophils 62 Not Estab. %   Lymphs 31 Not Estab. %   Monocytes 6 Not Estab. %   Eos 1 Not Estab. %   Basos 0 Not Estab. %   Neutrophils  Absolute 4.4 1.4 - 7.0 x10E3/uL   Lymphocytes Absolute 2.2 0.7 - 3.1 x10E3/uL   Monocytes Absolute 0.5 0.1 - 0.9 x10E3/uL   EOS (ABSOLUTE) 0.1 0.0 - 0.4 x10E3/uL   Basophils Absolute 0.0 0.0 - 0.2 x10E3/uL   Immature Granulocytes 0 Not Estab. %   Immature Grans (Abs) 0.0 0.0 - 0.1 x10E3/uL  Comprehensive metabolic panel  Result Value Ref Range   Glucose 91 65 - 99 mg/dL   BUN 10 6 - 20 mg/dL   Creatinine, Ser 0.61 0.57 - 1.00 mg/dL   GFR calc non Af Amer 126 >59 mL/min/1.73   GFR calc Af Amer 146 >59 mL/min/1.73   BUN/Creatinine Ratio 16 9 - 23   Sodium 143 134 - 144 mmol/L   Potassium 4.5 3.5 - 5.2 mmol/L   Chloride 103 96 - 106 mmol/L   CO2 22 20 - 29 mmol/L   Calcium 9.4 8.7 - 10.2 mg/dL   Total Protein 7.6 6.0 - 8.5 g/dL   Albumin 4.2 3.5 - 5.5 g/dL   Globulin, Total 3.4 1.5 - 4.5 g/dL   Albumin/Globulin Ratio 1.2 1.2 - 2.2   Bilirubin Total 0.3 0.0 - 1.2 mg/dL   Alkaline Phosphatase 64 39 - 117 IU/L   AST 11 0 - 40 IU/L   ALT 13 0 - 32 IU/L  POCT Urinalysis Dipstick  Result Value Ref Range   Color, UA yellow    Clarity, UA clear    Glucose, UA negative    Bilirubin, UA negative    Ketones, UA negative    Spec Grav, UA 1.025 1.010 - 1.025   Blood, UA negative    pH, UA 5.0 5.0 - 8.0   Protein, UA negative    Urobilinogen, UA 0.2 0.2 or 1.0 E.U./dL   Nitrite, UA negative    Leukocytes, UA Negative Negative   Appearance clear    Odor    POCT urine pregnancy  Result Value Ref Range   Preg Test, Ur Negative Negative      Assessment & Plan:   Problem List Items Addressed This Visit    None    Visit Diagnoses    Dysuria    -  Primary   Relevant Medications   oxybutynin (DITROPAN-XL) 5 MG 24 hr tablet   nitrofurantoin, macrocrystal-monohydrate, (MACROBID) 100 MG capsule   Other  Relevant Orders   POCT Urinalysis Dipstick (Completed)   CBC with Differential/Platelet   Comprehensive metabolic panel (Completed)   Costovertebral angle tenderness       Relevant  Medications   naproxen (NAPROSYN) 500 MG tablet   nitrofurantoin, macrocrystal-monohydrate, (MACROBID) 100 MG capsule   Other Relevant Orders   Comprehensive metabolic panel (Completed)     Acute to subacute pelvic pressure/pain.  Cannot fully exclude ongoing UTI, however dipstick is negative again today.  differential diagnosis includes acute urinary retention, OAB, cystitis.  Plan: 1. POCT dipstick UA today negative - Send urine culture.   2. START oxybutynin to help urinary symptoms.  Cautioned urinary retention may worsen and patient would need urgent treatment at ER. 3. Consider empiric treatment with macrobid, however instructed patient to hold this unless symptoms worsen again. 4. Follow-up 5-7 days prn.May need to consider future pelvic exam, STD testing, uroloyg referral.   Meds ordered this encounter  Medications  . oxybutynin (DITROPAN-XL) 5 MG 24 hr tablet    Sig: Take 1 tablet (5 mg total) by mouth at bedtime.    Dispense:  30 tablet    Refill:  0    Order Specific Question:   Supervising Provider    Answer:   Olin Hauser [[id number]]  . nitrofurantoin, macrocrystal-monohydrate, (MACROBID) 100 MG capsule    Sig: Take 1 capsule (100 mg total) by mouth 2 (two) times daily for 10 days.    Dispense:  20 capsule    Refill:  0    Order Specific Question:   Supervising Provider    Answer:   Olin Hauser [[id number]]    Follow up plan: Return in 5-7 days if symptoms worsen or fail to improve.  [name], DNP, AGPCNP-BC Adult Gerontology Primary Care Nurse Practitioner [facility] 02/09/2018, 9:31 AM

## 2018-02-12 ENCOUNTER — Encounter: Payer: Self-pay | Admitting: Nurse Practitioner

## 2018-02-23 ENCOUNTER — Other Ambulatory Visit: Payer: Self-pay | Admitting: Nurse Practitioner

## 2018-02-23 DIAGNOSIS — G43839 Menstrual migraine, intractable, without status migrainosus: Secondary | ICD-10-CM

## 2018-02-23 DIAGNOSIS — N946 Dysmenorrhea, unspecified: Secondary | ICD-10-CM

## 2018-02-23 DIAGNOSIS — Z842 Family history of other diseases of the genitourinary system: Secondary | ICD-10-CM

## 2018-02-23 NOTE — Telephone Encounter (Signed)
Pt needs refills on birth control and sumatripan sent to [facility] [phone]

## 2018-02-24 MED ORDER — ETONOGESTREL-ETHINYL ESTRADIOL 0.12-0.015 MG/24HR VA RING: VAGINAL_RING | VAGINAL | 0 refills | Status: DC

## 2018-02-24 MED ORDER — SUMATRIPTAN SUCCINATE 50 MG PO TABS: 50.0000 mg | ORAL_TABLET | ORAL | 5 refills | Status: DC | PRN

## 2018-03-16 ENCOUNTER — Other Ambulatory Visit (HOSPITAL_COMMUNITY): Payer: Self-pay | Admitting: Psychiatry

## 2018-03-16 DIAGNOSIS — F332 Major depressive disorder, recurrent severe without psychotic features: Secondary | ICD-10-CM

## 2018-03-25 ENCOUNTER — Ambulatory Visit (INDEPENDENT_AMBULATORY_CARE_PROVIDER_SITE_OTHER): Payer: 59 | Admitting: Psychiatry

## 2018-03-25 ENCOUNTER — Encounter (HOSPITAL_COMMUNITY): Payer: Self-pay | Admitting: Psychiatry

## 2018-03-25 VITALS — BP 126/81 | HR 107 | Ht 64.0 in | Wt 308.0 lb

## 2018-03-25 DIAGNOSIS — F3341 Major depressive disorder, recurrent, in partial remission: Secondary | ICD-10-CM

## 2018-03-25 DIAGNOSIS — F3342 Major depressive disorder, recurrent, in full remission: Secondary | ICD-10-CM

## 2018-03-25 DIAGNOSIS — F411 Generalized anxiety disorder: Secondary | ICD-10-CM

## 2018-03-25 MED ORDER — HYDROXYZINE HCL 10 MG PO TABS: 10.0000 mg | ORAL_TABLET | Freq: Every day | ORAL | 0 refills | Status: DC | PRN

## 2018-03-25 NOTE — Progress Notes (Signed)
BH MD/PA/NP OP Progress Note  03/25/2018 9:53 AM Amanda Wilson  MRN:  [id number]  Chief Complaint:  Chief Complaint    Follow-up; Anxiety     HPI:  28 years old single White female last seen by [facility] January, lexapro was changed to prozac that helped last visit for depression  Doing fair regarding depression but apparently has had some panic attacks or anxiety at times with no particular reason.  BuSpar has not helped in the past Vistaril 25 mg makes her somewhat sedating. She goes to [employer] and has a good roommate she also works. Depression overall is better somewhat tiredness and also snores at night has not got a sleep study done yet   Denies drug use  Modifying factor : roommate Duration one year around  No psychosis    Visit Diagnosis:    ICD-10-CM   1. Recurrent major depressive disorder, in partial remission ([place]) F33.41   2. Generalized anxiety disorder F41.1   3. Recurrent major depressive disorder, in full remission ([place]) F33.42     Past Psychiatric History: see chart  Past Medical History:  Past Medical History:  Diagnosis Date  . Anxiety   . Depression   . Frequent headaches    History reviewed. No pertinent surgical history.  Family Psychiatric History: father depression  Family History:  Family History  Problem Relation Age of Onset  . Cancer Mother        skin  . Depression Mother   . Heart disease Father   . Stroke Father   . Diabetes Father   . Depression Sister   . Depression Maternal Aunt   . Dementia Maternal Grandfather   . Depression Cousin     Social History:  Social History   Socioeconomic History  . Marital status: Single    Spouse name: Not on file  . Number of children: Not on file  . Years of education: Not on file  . Highest education level: Not on file  Occupational History  . Not on file  Social Needs  . Financial resource strain: Not hard at all  . Food insecurity:    Worry: Never true    Inability: Never true   . Transportation needs:    Medical: No    Non-medical: No  Tobacco Use  . Smoking status: Never Smoker  . Smokeless tobacco: Never Used  Substance and Sexual Activity  . Alcohol use: Not Currently  . Drug use: Not Currently    Types: Marijuana  . Sexual activity: Yes    Partners: Male    Birth control/protection: Pill  Lifestyle  . Physical activity:    Days per week: 0 days    Minutes per session: 0 min  . Stress: Very much  Relationships  . Social connections:    Talks on phone: Not on file    Gets together: Not on file    Attends religious service: Never    Active member of club or organization: No    Attends meetings of clubs or organizations: Never    Relationship status: Never married  Other Topics Concern  . Not on file  Social History Narrative  . Not on file    Allergies:  Allergies  Allergen Reactions  . Duricef [Cefadroxil]     Skin sloughing  . Cephalosporins Rash  . Latex Rash    Metabolic Disorder Labs: No results found for: HGBA1C, MPG No results found for: PROLACTIN No results found for: CHOL, TRIG, HDL, CHOLHDL, VLDL, LDLCALC  No results found for: TSH  Therapeutic Level Labs: No results found for: LITHIUM No results found for: VALPROATE No components found for:  CBMZ  Current Medications: Current Outpatient Medications  Medication Sig Dispense Refill  . etonogestrel-ethinyl estradiol (NUVARING) 0.12-0.015 MG/24HR vaginal ring Insert vaginally and leave in place for 3 consecutive weeks, then remove for 1 week. 3 each 0  . FLUoxetine (PROZAC) 40 MG capsule TAKE 1 CAPSULE(40 MG) BY MOUTH DAILY 90 capsule 0  . levofloxacin (LEVAQUIN) 750 MG tablet TK 1 T PO QD TAT    . Multiple Vitamin (MULTIVITAMIN) tablet Take 1 tablet by mouth daily.    . SUMAtriptan (IMITREX) 50 MG tablet Take 1 tablet (50 mg total) by mouth every 2 (two) hours as needed for migraine (x 2 doses max 24 hours). 12 tablet 5  . hydrOXYzine (ATARAX/VISTARIL) 10 MG tablet Take  1 tablet (10 mg total) by mouth daily as needed for anxiety. 30 tablet 0  . naproxen (NAPROSYN) 500 MG tablet TK 1 T PO BID PRN    . oxybutynin (DITROPAN-XL) 5 MG 24 hr tablet Take 1 tablet (5 mg total) by mouth at bedtime. (Patient not taking: Reported on 03/25/2018) 30 tablet 0  . phenazopyridine (PYRIDIUM) 200 MG tablet TK 1 T PO TID     No current facility-administered medications for this visit.      Musculoskeletal: Strength & Muscle Tone: within normal limits Gait & Station: normal Patient leans: no lean  Psychiatric Specialty Exam: Review of Systems  Constitutional: Positive for malaise/fatigue.  Cardiovascular: Negative for chest pain.  Skin: Negative for rash.  Neurological: Negative for tremors.  Psychiatric/Behavioral: Negative for depression.    Blood pressure 126/81, pulse (!) 107, height 5\' 4"  (1.626 m), weight (!) 308 lb (139.7 kg), SpO2 98 %.Body mass index is 52.87 kg/m.  General Appearance: Casual  Eye Contact:  Good  Speech:  Clear and Coherent  Volume:  Normal  Mood:  Somewhat anxious  Affect:  Congruent  Thought Process:  Goal Directed  Orientation:  Full (Time, Place, and Person)  Thought Content: Rumination   Suicidal Thoughts:  No  Homicidal Thoughts:  No  Memory:  Immediate;   Good Recent;   Fair  Judgement:  Good  Insight:  Good  Psychomotor Activity:  Normal  Concentration:  Concentration: Good and Attention Span: Good  Recall:  Good  Fund of Knowledge: Fair  Language: Good  Akathisia:  No  Handed:  Right  AIMS (if indicated): not done  Assets:  Desire for Improvement  ADL's:  Intact  Cognition: WNL  Sleep:  Fair   Screenings: GAD-7     [facility] Visit from 07/25/2017 in [facility] Office Visit from 08/31/2016 in [facility] Office Visit from 07/29/2016 in [facility] Office Visit from 06/22/2016 in [facility]  Total GAD-7 Score  3  8  14  16     PHQ2-9     Office Visit from  07/25/2017 in [facility] Office Visit from 10/28/2016 in [facility] Office Visit from 09/28/2016 in [facility] Office Visit from 08/31/2016 in [facility] Office Visit from 07/29/2016 in [facility]  PHQ-2 Total Score  0  0  2  3  2   PHQ-9 Total Score  5  7  10  10  8        Assessment and Plan: MDD recurrent moderate:doing fair, continue prozac and  therapy GAD: endorsing more at times with sporadic times. Continue prozac, change vistaril to 10mg  so she can use without feeling sedate Consider sleep study for fatigue as some anxiety at night while having snoring and fatigue during the day.  Fatigue and tiredness: refer to sleep study. See above  Reviewed meds, questions addressed FU 47m   Merian Capron, MD 03/25/2018, 9:53 AM

## 2018-04-21 ENCOUNTER — Encounter: Payer: Self-pay | Admitting: Nurse Practitioner

## 2018-04-21 ENCOUNTER — Other Ambulatory Visit: Payer: Self-pay

## 2018-04-21 ENCOUNTER — Ambulatory Visit (INDEPENDENT_AMBULATORY_CARE_PROVIDER_SITE_OTHER): Payer: 59 | Admitting: Nurse Practitioner

## 2018-04-21 DIAGNOSIS — M545 Low back pain, unspecified: Secondary | ICD-10-CM

## 2018-04-21 MED ORDER — BACLOFEN 10 MG PO TABS: 10.0000 mg | ORAL_TABLET | Freq: Three times a day (TID) | ORAL | 0 refills | Status: DC

## 2018-04-21 NOTE — Patient Instructions (Addendum)
Amanda Wilson,   Thank you for coming in to clinic today.  1. You have a lumbar muscle strain.  - Start taking Tylenol extra strength 1 to 2 tablets every 6-8 hours for aches or fever/chills for next few days as needed.  Do not take more than 3,000 mg in 24 hours from all medicines.  May take Ibuprofen as well if tolerated 200-400mg  every 8 hours as needed. May alternate tylenol and ibuprofen in same day.  If you prefer naproxen/Aleve, take that instead of ibuprofen not with ibuprofen. - Use heat and ice.  Apply this for 15 minutes at a time 6-8 times per day.   - Muscle rub with lidocaine, lidocaine patch, Biofreeze, or tiger balm for topical pain relief.  Avoid using this with heat and ice to avoid burns. - START muscle relaxer baclofen 10 mg one tablet up to three times daily.  This can cause drowsiness, so use caution.  It may be best to only take this at night for helping you during sleep.  Please schedule a follow-up appointment with [name], AGNP. Return 6 weeks if symptoms worsen or fail to improve.  If you have any other questions or concerns, please feel free to call the clinic or send a message through [place]. You may also schedule an earlier appointment if necessary.  You will receive a survey after today's visit either digitally by e-mail or paper by [employer]. Your experiences and feedback matter to [place].  Please respond so we know how we are doing as we provide care for you.   [name], DNP, AGNP-BC Adult Gerontology Nurse Practitioner [facility], [facility]   Low Back Pain Exercises See other page with pictures of each exercise.  Start with 1 or 2 of these exercises that you are most comfortable with. Do not do any exercises that cause you significant worsening pain. Some of these may cause some "stretching soreness" but it should go away after you stop the exercise, and get better over time. Gradually increase up to 3-4 exercises as  tolerated.  Standing hamstring stretch: Place the heel of your leg on a stool about 15 inches high. Keep your knee straight. Lean forward, bending at the hips until you feel a mild stretch in the back of your thigh. Make sure you do not roll your shoulders and bend at the waist when doing this or you will stretch your lower back instead. Hold the stretch for 15 to 30 seconds. Repeat 3 times. Repeat the same stretch on your other leg.  Cat and camel: Get down on your hands and knees. Let your stomach sag, allowing your back to curve downward. Hold this position for 5 seconds. Then arch your back and hold for 5 seconds. Do 3 sets of 10.  Quadriped Arm/Leg Raises: Get down on your hands and knees. Tighten your abdominal muscles to stiffen your spine. While keeping your abdominals tight, raise one arm and the opposite leg away from you. Hold this position for 5 seconds. Lower your arm and leg slowly and alternate sides. Do this 10 times on each side.  Pelvic tilt: Lie on your back with your knees bent and your feet flat on the floor. Tighten your abdominal muscles and push your lower back into the floor. Hold this position for 5 seconds, then relax. Do 3 sets of 10.  Partial curl: Lie on your back with your knees bent and your feet flat on the floor. Tighten your stomach muscles  and flatten your back against the floor. Tuck your chin to your chest. With your hands stretched out in front of you, curl your upper body forward until your shoulders clear the floor. Hold this position for 3 seconds. Don't hold your breath. It helps to breathe out as you lift your shoulders up. Relax. Repeat 10 times. Build to 3 sets of 10. To challenge yourself, clasp your hands behind your head and keep your elbows out to the side.  Lower trunk rotation: Lie on your back with your knees bent and your feet flat on the floor. Tighten your abdominal muscles and push your lower back into the floor. Keeping your shoulders down flat,  gently rotate your legs to one side, then the other as far as you can. Repeat 10 to 20 times.  Single knee to chest stretch: Lie on your back with your legs straight out in front of you. Bring one knee up to your chest and grasp the back of your thigh. Pull your knee toward your chest, stretching your buttock muscle. Hold this position for 15 to 30 seconds and return to the starting position. Repeat 3 times on each side.  Double knee to chest: Lie on your back with your knees bent and your feet flat on the floor. Tighten your abdominal muscles and push your lower back into the floor. Pull both knees up to your chest. Hold for 5 seconds and repeat 10 to 20 times.

## 2018-04-21 NOTE — Progress Notes (Signed)
Subjective:    Patient ID: Amanda Wilson, female    DOB: 12/23/1990, 28 y.o.   MRN: [id number]  Amanda Wilson is a 28 y.o. female presenting on 04/21/2018 for Back Pain (drop someting on the floor and bend down to pick it up and felt a pain in her back  Sharp pain is so severe it takes her breath when she makes sudden movement. x 3 days  The pain is preventing her from resting at night, because she cannot find a comfortable position )   HPI Back Pain Started about 3 days ago after bending forward with immediate pain.  Has had back pain before.  Has had upper back muscle strain, but this is first time is in her lower back.   - No radiating pain in to legs.   - Pain is central, axial pain.  - Sitting causes pain, Upright position helps, Bending forward increases pain, lying down is painful - keeps her awake, leaning side to side causes pain(stretching), twisting also causes pain. SLR left leg - no pain increase SLR right leg - no pain increase  Social History   Tobacco Use  . Smoking status: Never Smoker  . Smokeless tobacco: Never Used  Substance Use Topics  . Alcohol use: Not Currently  . Drug use: Not Currently    Types: Marijuana    Review of Systems Per HPI unless specifically indicated above     Objective:    There were no vitals taken for this visit.  Wt Readings from Last 3 Encounters:  02/09/18 (!) 305 lb 6.4 oz (138.5 kg)  07/25/17 289 lb 6.4 oz (131.3 kg)  04/29/17 285 lb 6.4 oz (129.5 kg)    Physical Exam  Patient remotely monitored.  Verbal communication appropriate.  Cognition normal. Audibly in pain with movement.    Results for orders placed or performed in visit on 02/09/18  Comprehensive metabolic panel  Result Value Ref Range   Glucose 96 65 - 99 mg/dL   BUN 9 6 - 20 mg/dL   Creatinine, Ser 0.72 0.57 - 1.00 mg/dL   GFR calc non Af Amer 117 >59 mL/min/1.73   GFR calc Af Amer 135 >59 mL/min/1.73   BUN/Creatinine Ratio 13 9 - 23   Sodium 139  134 - 144 mmol/L   Potassium 4.2 3.5 - 5.2 mmol/L   Chloride 103 96 - 106 mmol/L   CO2 20 20 - 29 mmol/L   Calcium 9.1 8.7 - 10.2 mg/dL   Total Protein 7.0 6.0 - 8.5 g/dL   Albumin 3.7 3.5 - 5.5 g/dL   Globulin, Total 3.3 1.5 - 4.5 g/dL   Albumin/Globulin Ratio 1.1 (L) 1.2 - 2.2   Bilirubin Total 0.4 0.0 - 1.2 mg/dL   Alkaline Phosphatase 67 39 - 117 IU/L   AST 13 0 - 40 IU/L   ALT 8 0 - 32 IU/L  CBC with Differential/Platelet  Result Value Ref Range   WBC 9.2 3.4 - 10.8 x10E3/uL   RBC 4.67 3.77 - 5.28 x10E6/uL   Hemoglobin 11.3 11.1 - 15.9 g/dL   Hematocrit 35.9 34.0 - 46.6 %   MCV 77 (L) 79 - 97 fL   MCH 24.2 (L) 26.6 - 33.0 pg   MCHC 31.5 31.5 - 35.7 g/dL   RDW 14.3 11.7 - 15.4 %   Platelets 288 150 - 450 x10E3/uL   Neutrophils 70 Not Estab. %   Lymphs 23 Not Estab. %   Monocytes 6 Not Estab. %  Eos 1 Not Estab. %   Basos 0 Not Estab. %   Neutrophils Absolute 6.4 1.4 - 7.0 x10E3/uL   Lymphocytes Absolute 2.2 0.7 - 3.1 x10E3/uL   Monocytes Absolute 0.5 0.1 - 0.9 x10E3/uL   EOS (ABSOLUTE) 0.1 0.0 - 0.4 x10E3/uL   Basophils Absolute 0.0 0.0 - 0.2 x10E3/uL   Immature Granulocytes 0 Not Estab. %   Immature Grans (Abs) 0.0 0.0 - 0.1 x10E3/uL  POCT Urinalysis Dipstick  Result Value Ref Range   Color, UA yellow    Clarity, UA clear    Glucose, UA Negative Negative   Bilirubin, UA Negative    Ketones, UA Negative    Spec Grav, UA >=1.030 (A) 1.010 - 1.025   Blood, UA Negative    pH, UA 5.0 5.0 - 8.0   Protein, UA Negative Negative   Urobilinogen, UA 0.2 0.2 or 1.0 E.U./dL   Nitrite, UA Negative    Leukocytes, UA Negative Negative   Appearance clear    Odor        Assessment & Plan:   Problem List Items Addressed This Visit    None    Visit Diagnoses    Acute midline low back pain without sciatica    -  Primary   Relevant Medications   baclofen (LIORESAL) 10 MG tablet    Pain likely self-limited.  Muscle strain possible complicated by morbid obesity, enciting  movement.  Plan:  1. Treat with OTC pain meds (acetaminophen and ibuprofen).  Discussed alternate dosing and max dosing. 2. Apply heat and/or ice to affected area. 3. May also apply a muscle rub with lidocaine or lidocaine patch after heat or ice. 4. Take muscle relaxer baclofen 10 mg up to three times daily.  Cautioned drowsiness. 5. Follow up 6 weeks prn    Meds ordered this encounter  Medications  . baclofen (LIORESAL) 10 MG tablet    Sig: Take 1 tablet (10 mg total) by mouth 3 (three) times daily.    Dispense:  30 each    Refill:  0    Order Specific Question:   Supervising Provider    Answer:   Olin Hauser [[id number]]    Follow up plan: Return 6 weeks if symptoms worsen or fail to improve.  Disclosed to patient at start of encounter that we will bill telephone services and she will receive bill of services provided.  Patient consents to be treated via phone prior to discussion. - Patient is at her home and is accessed via telephone. - Services are provided from [facility]. - Time spent in direct consultation with patient on phone: 8 minutes   [name], DNP, AGPCNP-BC Adult Gerontology Primary Care Nurse Practitioner [facility] 04/21/2018, 2:23 PM

## 2018-05-18 ENCOUNTER — Other Ambulatory Visit: Payer: Self-pay | Admitting: Nurse Practitioner

## 2018-05-18 DIAGNOSIS — Z842 Family history of other diseases of the genitourinary system: Secondary | ICD-10-CM

## 2018-05-18 DIAGNOSIS — N946 Dysmenorrhea, unspecified: Secondary | ICD-10-CM

## 2018-05-18 DIAGNOSIS — G43839 Menstrual migraine, intractable, without status migrainosus: Secondary | ICD-10-CM

## 2018-06-10 ENCOUNTER — Other Ambulatory Visit: Payer: Self-pay

## 2018-06-10 ENCOUNTER — Encounter (HOSPITAL_COMMUNITY): Payer: Self-pay | Admitting: Psychiatry

## 2018-06-10 ENCOUNTER — Ambulatory Visit (INDEPENDENT_AMBULATORY_CARE_PROVIDER_SITE_OTHER): Payer: 59 | Admitting: Psychiatry

## 2018-06-10 DIAGNOSIS — F332 Major depressive disorder, recurrent severe without psychotic features: Secondary | ICD-10-CM

## 2018-06-10 DIAGNOSIS — F411 Generalized anxiety disorder: Secondary | ICD-10-CM

## 2018-06-10 MED ORDER — HYDROXYZINE HCL 10 MG PO TABS: 10.0000 mg | ORAL_TABLET | Freq: Every day | ORAL | 1 refills | Status: AC | PRN

## 2018-06-10 MED ORDER — FLUOXETINE HCL 40 MG PO CAPS: ORAL_CAPSULE | ORAL | 0 refills | Status: DC

## 2018-06-10 NOTE — Progress Notes (Signed)
[place] MD/PA/NP OP Progress Note  06/10/2018 10:15 AM Amanda Wilson  MRN:  [id number]  Chief Complaint:  Chief Complaint    Follow-up; Anxiety    I connected with Amanda Wilson on 06/10/18 at 10:00 AM EDT by telephone and verified that I am speaking with the correct person using two identifiers.   I discussed the limitations, risks, security and privacy concerns of performing an evaluation and management service by telephone and the availability of in person appointments. I also discussed with the patient that there may be a patient responsible charge related to this service. The patient expressed understanding and agreed to proceed. HPI:  28 years old single White female diagnosed with depression.  Doing fair on prozac and vistaril. buspar didn't help. Low dose vistaril helps anxiety, less fatigue Finished semester at [employer].   Denies drug use  Modifying factor : roommate, family Duration near 2 years  No psychosis    Visit Diagnosis:    ICD-10-CM   1. Generalized anxiety disorder F41.1   2. Severe episode of recurrent major depressive disorder, without psychotic features ([place]) F33.2 FLUoxetine (PROZAC) 40 MG capsule    Past Psychiatric History: see chart  Past Medical History:  Past Medical History:  Diagnosis Date  . Anxiety   . Depression   . Frequent headaches    History reviewed. No pertinent surgical history.  Family Psychiatric History: father depression  Family History:  Family History  Problem Relation Age of Onset  . Cancer Mother        skin  . Depression Mother   . Heart disease Father   . Stroke Father   . Diabetes Father   . Depression Sister   . Depression Maternal Aunt   . Dementia Maternal Grandfather   . Depression Cousin     Social History:  Social History   Socioeconomic History  . Marital status: Single    Spouse name: Not on file  . Number of children: Not on file  . Years of education: Not on file  . Highest education level:  Not on file  Occupational History  . Not on file  Social Needs  . Financial resource strain: Not hard at all  . Food insecurity:    Worry: Never true    Inability: Never true  . Transportation needs:    Medical: No    Non-medical: No  Tobacco Use  . Smoking status: Never Smoker  . Smokeless tobacco: Never Used  Substance and Sexual Activity  . Alcohol use: Not Currently  . Drug use: Not Currently    Types: Marijuana  . Sexual activity: Yes    Partners: Male    Birth control/protection: Pill  Lifestyle  . Physical activity:    Days per week: 0 days    Minutes per session: 0 min  . Stress: Very much  Relationships  . Social connections:    Talks on phone: Not on file    Gets together: Not on file    Attends religious service: Never    Active member of club or organization: No    Attends meetings of clubs or organizations: Never    Relationship status: Never married  Other Topics Concern  . Not on file  Social History Narrative  . Not on file    Allergies:  Allergies  Allergen Reactions  . Duricef [Cefadroxil]     Skin sloughing  . Cephalosporins Rash  . Latex Rash    Metabolic Disorder Labs: No results found for: HGBA1C,  MPG No results found for: PROLACTIN No results found for: CHOL, TRIG, HDL, CHOLHDL, VLDL, LDLCALC No results found for: TSH  Therapeutic Level Labs: No results found for: LITHIUM No results found for: VALPROATE No components found for:  CBMZ  Current Medications: Current Outpatient Medications  Medication Sig Dispense Refill  . baclofen (LIORESAL) 10 MG tablet Take 1 tablet (10 mg total) by mouth 3 (three) times daily. 30 each 0  . etonogestrel-ethinyl estradiol (NUVARING) 0.12-0.015 MG/24HR vaginal ring INSERT 1 RING VAGINALLY AND LEAVE IN PLACE FOR 3 CONSECUTIVE WEEKS THEN REMOVE FOR 1 WEEK 3 each 0  . FLUoxetine (PROZAC) 40 MG capsule TAKE 1 CAPSULE(40 MG) BY MOUTH DAILY 90 capsule 0  . hydrOXYzine (ATARAX/VISTARIL) 10 MG tablet Take  1 tablet (10 mg total) by mouth daily as needed for anxiety. 30 tablet 1  . Multiple Vitamin (MULTIVITAMIN) tablet Take 1 tablet by mouth daily.    . naproxen (NAPROSYN) 500 MG tablet TK 1 T PO BID PRN    . SUMAtriptan (IMITREX) 50 MG tablet Take 1 tablet (50 mg total) by mouth every 2 (two) hours as needed for migraine (x 2 doses max 24 hours). 12 tablet 5   No current facility-administered medications for this visit.       Psychiatric Specialty Exam: Review of Systems  Constitutional: Negative for malaise/fatigue.  Cardiovascular: Negative for chest pain.  Skin: Negative for rash.  Neurological: Negative for tremors.  Psychiatric/Behavioral: Negative for depression.    There were no vitals taken for this visit.There is no height or weight on file to calculate BMI.  General Appearance:   Eye Contact:    Speech:  Clear and Coherent  Volume:  Normal  Mood: better  Affect:  Congruent  Thought Process:  Goal Directed  Orientation:  Full (Time, Place, and Person)  Thought Content: Rumination   Suicidal Thoughts:  No  Homicidal Thoughts:  No  Memory:  Immediate;   Good Recent;   Fair  Judgement:  Good  Insight:  Good  Psychomotor Activity:  Normal  Concentration:  Concentration: Good and Attention Span: Good  Recall:  Good  Fund of Knowledge: Fair  Language: Good  Akathisia:  No  Handed:  Right  AIMS (if indicated): not done  Assets:  Desire for Improvement  ADL's:  Intact  Cognition: WNL  Sleep:  Fair   Screenings: GAD-7     Office Visit from 07/25/2017 in [facility] Office Visit from 08/31/2016 in [facility] Office Visit from 07/29/2016 in [facility] Office Visit from 06/22/2016 in [facility]  Total GAD-7 Score  3  8  14  16     PHQ2-9     Office Visit from 07/25/2017 in [facility] Office Visit from 10/28/2016 in [facility] Office Visit from 09/28/2016 in [facility] Office Visit from 08/31/2016 in [facility] Office Visit from 07/29/2016 in [place]  PHQ-2 Total Score  0  0  2  3  2   PHQ-9 Total Score  5  7  10  10  8        Assessment and Plan: MDD recurrent moderate:doing fair. Continue prozac 40mg  ZOX:WRUE anxious, continue prozac and vistaril meds sent to pharmacy  Fatigue and tiredness: better.  I discussed the assessment and treatment plan with the patient. The patient was provided an opportunity to ask questions and all were answered. The patient agreed  with the plan and demonstrated an understanding of the instructions.   The patient was advised to call back or seek an in-person evaluation if the symptoms worsen or if the condition fails to improve as anticipated.  I provided 15  minutes of non-face-to-face time during this encounter. Reviewed meds, questions addressed FU 3-4 m   Merian Capron, MD 06/10/2018, 10:15 AM

## 2018-08-11 ENCOUNTER — Other Ambulatory Visit: Payer: Self-pay | Admitting: Family Medicine

## 2018-08-11 DIAGNOSIS — N946 Dysmenorrhea, unspecified: Secondary | ICD-10-CM

## 2018-08-11 DIAGNOSIS — Z842 Family history of other diseases of the genitourinary system: Secondary | ICD-10-CM

## 2018-08-11 DIAGNOSIS — G43839 Menstrual migraine, intractable, without status migrainosus: Secondary | ICD-10-CM

## 2018-08-14 ENCOUNTER — Ambulatory Visit (INDEPENDENT_AMBULATORY_CARE_PROVIDER_SITE_OTHER): Payer: 59 | Admitting: Licensed Clinical Social Worker

## 2018-08-14 DIAGNOSIS — F411 Generalized anxiety disorder: Secondary | ICD-10-CM | POA: Diagnosis not present

## 2018-08-21 ENCOUNTER — Encounter (HOSPITAL_COMMUNITY): Payer: Self-pay | Admitting: Licensed Clinical Social Worker

## 2018-08-21 NOTE — Progress Notes (Signed)
Virtual Visit via Video Note  I connected with Amanda Wilson on 08/14/18 at 12:30 PM EDT by a video enabled telemedicine application and verified that I am speaking with the correct person using two identifiers.  Location: Patient: Home Provider: Office   I discussed the limitations of evaluation and management by telemedicine and the availability of in person appointments. The patient expressed understanding and agreed to proceed.     I discussed the assessment and treatment plan with the patient. The patient was provided an opportunity to ask questions and all were answered. The patient agreed with the plan and demonstrated an understanding of the instructions.   The patient was advised to call back or seek an in-person evaluation if the symptoms worsen or if the condition fails to improve as anticipated.  I provided 52 minutes of non-face-to-face time during this encounter.   Archie Balboa, LCAS-A    THERAPIST PROGRESS NOTE  Session Time: 12:30-1:30  Participation Level: Active  Behavioral Response: CasualAlertAnxious  Type of Therapy: Individual Therapy  Treatment Goals addressed: Anxiety and Coping  Interventions: CBT, Psychosocial Skills: Boundaries, Codependency and Supportive  Summary: Amanda Wilson is a 28 y.o. female who presents with hx of developmental traumatic exposure, loss of Father to illness at age 66, anxiety, and depression. She is present and active in our first session since Dec 2019. 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 She has felt more irritable and "on edge" lately. Counselor teaches PT coping skills for boundary setting and self care.   Suicidal/Homicidal: Nowithout intent/plan  Therapist Response: I used open questions, active listening, and reflection and validation. I used psychoeducation on Boundary Setting bxs and  healthy ways to "say no". I helped PT process the psychological developmental trauma she experienced from a young age.   Plan: Return again in 2 weeks.  Diagnosis:    ICD-10-CM   1. Generalized anxiety disorder  F41.[place] , LCAS-A 08/21/2018

## 2018-08-22 ENCOUNTER — Encounter (HOSPITAL_COMMUNITY): Payer: Self-pay | Admitting: Licensed Clinical Social Worker

## 2018-08-22 ENCOUNTER — Ambulatory Visit (INDEPENDENT_AMBULATORY_CARE_PROVIDER_SITE_OTHER): Payer: 59 | Admitting: Licensed Clinical Social Worker

## 2018-08-22 DIAGNOSIS — F411 Generalized anxiety disorder: Secondary | ICD-10-CM | POA: Diagnosis not present

## 2018-08-22 DIAGNOSIS — F332 Major depressive disorder, recurrent severe without psychotic features: Secondary | ICD-10-CM

## 2018-08-22 NOTE — Progress Notes (Signed)
Virtual Visit via Video Note  I connected with Amanda Wilson on 08/22/18 at 10:00 AM EDT by a video enabled telemedicine application and verified that I am speaking with the correct person using two identifiers.  Location: Patient: Home Provider: Office   I discussed the limitations of evaluation and management by telemedicine and the availability of in person appointments. The patient expressed understanding and agreed to proceed.    I discussed the assessment and treatment plan with the patient. The patient was provided an opportunity to ask questions and all were answered. The patient agreed with the plan and demonstrated an understanding of the instructions.   The patient was advised to call back or seek an in-person evaluation if the symptoms worsen or if the condition fails to improve as anticipated.  I provided 52 minutes of non-face-to-face time during this encounter.   Archie Balboa, LCAS-A    THERAPIST PROGRESS NOTE  Session Time: 10-11  Participation Level: Active  Behavioral Response: CasualAlertEuthymic  Type of Therapy: Individual Therapy  Treatment Goals addressed: Anxiety  Interventions: CBT, Strength-based and Supportive  Summary: Ikesha Siller is a 28 y.o. female who presents with hx of anxiety and depression. She is active, engaged, and tearful in session. She reports her anxiety is getting better and she felt much better this week since our last session. PT and I discuss strategies for self care and how to feel "more at home within herself". PT reports she often feels that she knows what to do to take care of herself but is unable to because it requires too much energy. PT feels best when she is walking and cleaning her house alone.    Suicidal/Homicidal: Nowithout intent/plan  Therapist Response: I used open questions, active listening, and reflection. I helped PT identify areas of growth for self care. I helped PT challenge her automatic negative  thinking. I helped her take ownership of her self talk and asked her to repeat phrases that promote self responsibility of feelings.   Plan: Return again in 1 weeks.  Diagnosis:    ICD-10-CM   1. Generalized anxiety disorder  F41.1   2. Severe episode of recurrent major depressive disorder, without psychotic features [facility])  F33.2      Archie Balboa, LCAS-A 08/22/2018

## 2018-08-29 ENCOUNTER — Encounter (HOSPITAL_COMMUNITY): Payer: Self-pay | Admitting: Licensed Clinical Social Worker

## 2018-08-29 ENCOUNTER — Other Ambulatory Visit: Payer: Self-pay

## 2018-08-29 ENCOUNTER — Ambulatory Visit (INDEPENDENT_AMBULATORY_CARE_PROVIDER_SITE_OTHER): Payer: 59 | Admitting: Licensed Clinical Social Worker

## 2018-08-29 DIAGNOSIS — F411 Generalized anxiety disorder: Secondary | ICD-10-CM

## 2018-08-29 DIAGNOSIS — F332 Major depressive disorder, recurrent severe without psychotic features: Secondary | ICD-10-CM | POA: Diagnosis not present

## 2018-08-29 DIAGNOSIS — F3341 Major depressive disorder, recurrent, in partial remission: Secondary | ICD-10-CM

## 2018-08-29 NOTE — Progress Notes (Signed)
Virtual Visit via Video Note  I connected with Amanda Wilson on 08/29/18 at 12:30 PM EDT by a video enabled telemedicine application and verified that I am speaking with the correct person using two identifiers.  Location: Patient: Home Provider: Office   I discussed the limitations of evaluation and management by telemedicine and the availability of in person appointments. The patient expressed understanding and agreed to proceed.  I discussed the assessment and treatment plan with the patient. The patient was provided an opportunity to ask questions and all were answered. The patient agreed with the plan and demonstrated an understanding of the instructions.   The patient was advised to call back or seek an in-person evaluation if the symptoms worsen or if the condition fails to improve as anticipated.  I provided 52 minutes of non-face-to-face time during this encounter.   Archie Balboa, LCAS-A    THERAPIST PROGRESS NOTE  Session Time: 12:30-1:30  Participation Level: Active  Behavioral Response: CasualAlertAnxious  Type of Therapy: Individual Therapy  Treatment Goals addressed: Anxiety and Coping  Interventions: CBT, Supportive and Reframing  Summary: Amanda Wilson is a 28 y.o. female who presents with long hx of MDD and GAD. She reports she had a difficult night last night and was unable to sleep until 4AM. Pt states she has been very reflective since our last session and has been working on mindfulness towards her anxiety and "the underlying need". PT shares her message to herself is "what is this feeling tryign to communicate to me?". She has dealt w/ recent money stressors but feels ok about her finances at the moment. PT works on her core negative belief of "I am not enough" and relates it to adverse early childhood experiences of being bullied for her larger body size. PT recounts numerous episodes in which other females teased her for her size and for her father's  poor health. PT becomes tearful and states that she is just now learning to accept herself as "enough for herself".    Suicidal/Homicidal: Nowithout intent/plan  Therapist Response: I used open questions, active listening, and reflection. I helped PT identify her negative core beliefs and challenge them w/ affirmation statements. I validated PT's depression from her adolescence since she was "taught by others to hate herself and find things wrong w/ her".   Plan: Return again in 1 weeks.  Diagnosis:    ICD-10-CM   1. Generalized anxiety disorder  F41.1   2. Severe episode of recurrent major depressive disorder, without psychotic features ([place])  F33.2   3. Recurrent major depressive disorder, in partial remission [facility])  F33.41        Archie Balboa, LCAS-A 08/29/2018

## 2018-09-05 ENCOUNTER — Encounter (HOSPITAL_COMMUNITY): Payer: Self-pay | Admitting: Licensed Clinical Social Worker

## 2018-09-05 ENCOUNTER — Ambulatory Visit (INDEPENDENT_AMBULATORY_CARE_PROVIDER_SITE_OTHER): Payer: 59 | Admitting: Licensed Clinical Social Worker

## 2018-09-05 ENCOUNTER — Other Ambulatory Visit: Payer: Self-pay

## 2018-09-05 DIAGNOSIS — F411 Generalized anxiety disorder: Secondary | ICD-10-CM | POA: Diagnosis not present

## 2018-09-05 NOTE — Progress Notes (Signed)
Virtual Visit via Video Note  I connected with Amanda Wilson on 09/05/18 at  3:30 PM EDT by a video enabled telemedicine application and verified that I am speaking with the correct person using two identifiers.  Location: Patient: Home Provider: Office   I discussed the limitations of evaluation and management by telemedicine and the availability of in person appointments. The patient expressed understanding and agreed to proceed.   I discussed the assessment and treatment plan with the patient. The patient was provided an opportunity to ask questions and all were answered. The patient agreed with the plan and demonstrated an understanding of the instructions.   The patient was advised to call back or seek an in-person evaluation if the symptoms worsen or if the condition fails to improve as anticipated.  I provided 55 minutes of non-face-to-face time during this encounter.   Archie Balboa, LCAS-A    THERAPIST PROGRESS NOTE  Session Time: 3:30-4:30  Participation Level: Active  Behavioral Response: CasualAlertEuthymic  Type of Therapy: Individual Therapy  Treatment Goals addressed: Anxiety  Interventions: CBT  Summary: Amanda Wilson is a 28 y.o. female who presents with hx of anxiety and depression. She is upbeat, active, and engaged in session. She states she had a great week and was highly productive. She is waking up early and falling asleep earlier. She is excited for school to start and feels energy. She is more mindful and notices her negative internal self talk. She continues to repeat the affirmation "I am enough". She was able to challenge negative self talk when she was not receiving a text message. She continues to report feeling anger towards her parents. She is feeling more comfortable expressing this and admits it does not necessarily take away from her love for them. She expresses strong emotions and gratitude for her current path and the way her life as  turned out thus far.    Suicidal/Homicidal: Nowithout intent/plan  Therapist Response: I used open questions, active listening, and encouragement and affirmation to help PT. I used reinforcement strategies to continue use of CBT for challenging negative thinking.   Plan: Return again in 2 weeks.  Diagnosis:    ICD-10-CM   1. Generalized anxiety disorder  F41.[place], LCAS-A 09/05/2018

## 2018-09-26 ENCOUNTER — Encounter (HOSPITAL_COMMUNITY): Payer: Self-pay | Admitting: Licensed Clinical Social Worker

## 2018-09-26 ENCOUNTER — Ambulatory Visit (INDEPENDENT_AMBULATORY_CARE_PROVIDER_SITE_OTHER): Payer: 59 | Admitting: Licensed Clinical Social Worker

## 2018-09-26 ENCOUNTER — Other Ambulatory Visit: Payer: Self-pay

## 2018-09-26 DIAGNOSIS — F411 Generalized anxiety disorder: Secondary | ICD-10-CM | POA: Diagnosis not present

## 2018-09-26 DIAGNOSIS — F332 Major depressive disorder, recurrent severe without psychotic features: Secondary | ICD-10-CM | POA: Diagnosis not present

## 2018-09-26 NOTE — Progress Notes (Signed)
Virtual Visit via Video Note  I connected with Amanda Wilson on 09/26/18 at  1:30 PM EDT by a video enabled telemedicine application and verified that I am speaking with the correct person using two identifiers.  Location: Patient: Home Provider: Office   I discussed the limitations of evaluation and management by telemedicine and the availability of in person appointments. The patient expressed understanding and agreed to proceed.     I discussed the assessment and treatment plan with the patient. The patient was provided an opportunity to ask questions and all were answered. The patient agreed with the plan and demonstrated an understanding of the instructions.   The patient was advised to call back or seek an in-person evaluation if the symptoms worsen or if the condition fails to improve as anticipated.  I provided 55 minutes of non-face-to-face time during this encounter.   Archie Balboa, LCAS-A    THERAPIST PROGRESS NOTE  Session Time: 1:30-2:30  Participation Level: Active  Behavioral Response: CasualAlertAnxious  Type of Therapy: Individual Therapy  Treatment Goals addressed: Anxiety  Interventions: CBT, Biofeedback and Meditation: Deep Breathing  Summary: Amanda Wilson is a 28 y.o. female who presents with long hx of GAD and MDD. She is active, engaged, w/ labile affect, tearfulness, and laughter in session. She reports that she has been busy w/ adjusting to online classes and a "totally online graduate semester". She is agitated and admits she is struggling to keep up her breathing in session. I invite her to practice one minute of mindful breathing which appears to calm her down and she states she feels "much more centered". She then admits that she has been putting off her feelings of sadness and fear that her best friend has decided to move back to [facility] for a temporary stay. PT feels an irrational fear that "this could be the end of their relationship". I work to  help PT identify her anxieties, follow the threads to the root anxiety and coach herself through how she would handle various outcomes. Pt responds well to this. PT realizes that she is trying to respect her BF's boundaries and feels that she has made a lot of progress.   Suicidal/Homicidal: Nowithout intent/plan  Therapist Response: I use open questions, active listening, and validation. I coach PT through helpful self care tips and positive self talk improvements.   Plan: Return again in 2 weeks.  Diagnosis:    ICD-10-CM   1. Generalized anxiety disorder  F41.1   2. Severe episode of recurrent major depressive disorder, without psychotic features [facility])  F33.2       Archie Balboa, LCAS-A 09/26/2018

## 2018-10-16 ENCOUNTER — Ambulatory Visit (INDEPENDENT_AMBULATORY_CARE_PROVIDER_SITE_OTHER): Payer: 59 | Admitting: Licensed Clinical Social Worker

## 2018-10-16 ENCOUNTER — Encounter (HOSPITAL_COMMUNITY): Payer: Self-pay | Admitting: Licensed Clinical Social Worker

## 2018-10-16 DIAGNOSIS — F411 Generalized anxiety disorder: Secondary | ICD-10-CM

## 2018-10-16 NOTE — Progress Notes (Signed)
Virtual Visit via Video Note  I connected with Amanda Wilson on 10/16/18 at  2:30 PM EDT by a video enabled telemedicine application and verified that I am speaking with the correct person using two identifiers.  Location: Patient: Home Provider: Office   I discussed the limitations of evaluation and management by telemedicine and the availability of in person appointments. The patient expressed understanding and agreed to proceed.    I discussed the assessment and treatment plan with the patient. The patient was provided an opportunity to ask questions and all were answered. The patient agreed with the plan and demonstrated an understanding of the instructions.   The patient was advised to call back or seek an in-person evaluation if the symptoms worsen or if the condition fails to improve as anticipated.  I provided 52 minutes of non-face-to-face time during this encounter.   Archie Balboa, LCAS-A    THERAPIST PROGRESS NOTE  Session Time: 2:30-3:30  Participation Level: Active  Behavioral Response: CasualAlertDepressed  Type of Therapy: Individual Therapy  Treatment Goals addressed: Anxiety  Interventions: CBT and Supportive  Summary: Amanda Wilson is a 28 y.o. female who presents with hx of depression and Anxiety. She reports she is stressed and more tired than she anticipated. She discusses a recent outing w/ her friend Amanda Wilson that resulted in her feelings hurt, angry, and scared. She commented on the various feelings she was realizing she was having in the moment. She verbalizes strong insight into her caretaking tendencies and how she needs to prioritize herself. PT is tearful and has appropriate affect throughout session.   Suicidal/Homicidal: Nowithout intent/plan  Therapist Response: I used reflection, open questions, and emotional deepening questions to help PT explore her reactions to stressors and how to better prioritize herself in the future.   Plan:  Return again in 2 weeks.  Diagnosis:    ICD-10-CM   1. Generalized anxiety disorder  F41.[name], LCAS-A 10/16/2018

## 2018-10-29 ENCOUNTER — Other Ambulatory Visit: Payer: Self-pay | Admitting: Family Medicine

## 2018-10-29 DIAGNOSIS — Z842 Family history of other diseases of the genitourinary system: Secondary | ICD-10-CM

## 2018-10-29 DIAGNOSIS — G43839 Menstrual migraine, intractable, without status migrainosus: Secondary | ICD-10-CM

## 2018-10-29 DIAGNOSIS — N946 Dysmenorrhea, unspecified: Secondary | ICD-10-CM

## 2018-10-30 ENCOUNTER — Encounter (HOSPITAL_COMMUNITY): Payer: Self-pay | Admitting: Licensed Clinical Social Worker

## 2018-10-30 ENCOUNTER — Ambulatory Visit (INDEPENDENT_AMBULATORY_CARE_PROVIDER_SITE_OTHER): Payer: 59 | Admitting: Licensed Clinical Social Worker

## 2018-10-30 DIAGNOSIS — F411 Generalized anxiety disorder: Secondary | ICD-10-CM | POA: Diagnosis not present

## 2018-10-30 DIAGNOSIS — F332 Major depressive disorder, recurrent severe without psychotic features: Secondary | ICD-10-CM | POA: Diagnosis not present

## 2018-10-30 NOTE — Progress Notes (Signed)
Virtual Visit via Video Note  I connected with Amanda Wilson on 10/30/18 at  8:00 AM EDT by a video enabled telemedicine application and verified that I am speaking with the correct person using two identifiers.  Location: Patient: Home Provider: Office   I discussed the limitations of evaluation and management by telemedicine and the availability of in person appointments. The patient expressed understanding and agreed to proceed.     I discussed the assessment and treatment plan with the patient. The patient was provided an opportunity to ask questions and all were answered. The patient agreed with the plan and demonstrated an understanding of the instructions.   The patient was advised to call back or seek an in-person evaluation if the symptoms worsen or if the condition fails to improve as anticipated.  I provided 52 minutes of non-face-to-face time during this encounter.   Archie Balboa, LCAS-A    THERAPIST PROGRESS NOTE  Session Time: 8-9  Participation Level: Active  Behavioral Response: DisheveledAlertAnxious and Depressed  Type of Therapy: Individual Therapy  Treatment Goals addressed: Anxiety  Interventions: CBT and Supportive  Summary: Amanda Wilson is a 28 y.o. female who presents with hx of MDD and GAD. She reports she is not having a good 2 weeks. She was notified through social media that her grandmother, from whom she was estranged, had died. This caused PT to fixate on her failures w/i her family system, become critical of herself, and judge herself. She reports she also had her best friend's wedding, and a full week of clients. We discuss PT's busy schedule and the ways that she compartmentalized her emotions to be able to handle her full schedule. PT continues to struggle w/ voice of judgment and irrational black and white thinking w/ many "shoulds". She is learning to recognize these voices and challenge them through self care.   Suicidal/Homicidal:  Nowithout intent/plan  Therapist Response: I used open questions, actives listening, and reframing. I used encouragement and cognitive challenging to help PT take ownership of her feelings of guilt while also learning to incorporate new methods of self care.   Plan: Return again in 3 weeks.  Diagnosis:    ICD-10-CM   1. Generalized anxiety disorder  F41.1   2. Severe episode of recurrent major depressive disorder, without psychotic features [facility])  F33.2       Archie Balboa, LCAS-A 10/30/2018

## 2018-11-21 ENCOUNTER — Encounter (HOSPITAL_COMMUNITY): Payer: Self-pay | Admitting: Licensed Clinical Social Worker

## 2018-11-21 ENCOUNTER — Other Ambulatory Visit: Payer: Self-pay

## 2018-11-21 ENCOUNTER — Ambulatory Visit (INDEPENDENT_AMBULATORY_CARE_PROVIDER_SITE_OTHER): Payer: 59 | Admitting: Licensed Clinical Social Worker

## 2018-11-21 DIAGNOSIS — F411 Generalized anxiety disorder: Secondary | ICD-10-CM

## 2018-11-21 DIAGNOSIS — F332 Major depressive disorder, recurrent severe without psychotic features: Secondary | ICD-10-CM | POA: Diagnosis not present
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
## 2018-12-14 ENCOUNTER — Ambulatory Visit (HOSPITAL_COMMUNITY): Payer: 59 | Admitting: Licensed Clinical Social Worker

## 2018-12-26 ENCOUNTER — Encounter (HOSPITAL_COMMUNITY): Payer: Self-pay | Admitting: Licensed Clinical Social Worker

## 2018-12-26 ENCOUNTER — Ambulatory Visit (INDEPENDENT_AMBULATORY_CARE_PROVIDER_SITE_OTHER): Payer: 59 | Admitting: Licensed Clinical Social Worker

## 2018-12-26 DIAGNOSIS — F411 Generalized anxiety disorder: Secondary | ICD-10-CM

## 2018-12-26 NOTE — Progress Notes (Signed)
Virtual Visit via Video Note  I connected with Amanda Wilson on 12/26/18 at 11:00 AM EST by a video enabled telemedicine application and verified that I am speaking with the correct person using two identifiers.  Location: Patient: Home Provider: Office   I discussed the limitations of evaluation and management by telemedicine and the availability of in person appointments. The patient expressed understanding and agreed to proceed.    I discussed the assessment and treatment plan with the patient. The patient was provided an opportunity to ask questions and all were answered. The patient agreed with the plan and demonstrated an understanding of the instructions.   The patient was advised to call back or seek an in-person evaluation if the symptoms worsen or if the condition fails to improve as anticipated.  I provided 55 minutes of non-face-to-face time during this encounter.   Archie Balboa, LCAS-A    THERAPIST PROGRESS NOTE  Session Time: 11-11:55  Participation Level: Active  Behavioral Response: CasualAlertEuthymic  Type of Therapy: Individual Therapy  Treatment Goals addressed: Anxiety  Interventions: CBT and Meditation: Mindfulness  Summary: Amanda Wilson is a 28 y.o. female who presents with GAD and depressed mood. 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 We discuss strategies for this including mindfulness of mood, awareness of hunger and "listening to the body". She becomes tearful when describing her relationship w/ her S/O. She admits things are "ok" right now but is also confused about the state of her attraction and relationship to him. PT discusses her plans for enjoying her upcoming break from school and learning how to prioritize herself.  Suicidal/Homicidal: Nowithout intent/plan  Therapist Response: I used open  questions, active listening, and reflection and supportive feedback. I taught psychoeducation on mindfulness based CBT practices to introduce breathing and body awareness to PT's moment to moment experiencing.   Plan: Return again in 2 weeks.  Diagnosis:    ICD-10-CM   1. Generalized anxiety disorder  F41.[place] Amanda Wilson, LCAS-A 12/26/2018

## 2019-01-02 ENCOUNTER — Telehealth: Payer: Self-pay | Admitting: Nurse Practitioner

## 2019-01-02 ENCOUNTER — Other Ambulatory Visit (HOSPITAL_COMMUNITY): Payer: Self-pay | Admitting: Psychiatry

## 2019-01-02 DIAGNOSIS — F332 Major depressive disorder, recurrent severe without psychotic features: Secondary | ICD-10-CM

## 2019-01-02 DIAGNOSIS — N3000 Acute cystitis without hematuria: Secondary | ICD-10-CM

## 2019-01-02 MED ORDER — NITROFURANTOIN MONOHYD MACRO 100 MG PO CAPS: 100.0000 mg | ORAL_CAPSULE | Freq: Two times a day (BID) | ORAL | 0 refills | Status: DC

## 2019-01-02 NOTE — Progress Notes (Signed)

## 2019-01-16 ENCOUNTER — Ambulatory Visit (INDEPENDENT_AMBULATORY_CARE_PROVIDER_SITE_OTHER): Payer: Managed Care, Other (non HMO) | Admitting: Licensed Clinical Social Worker

## 2019-01-16 DIAGNOSIS — F332 Major depressive disorder, recurrent severe without psychotic features: Secondary | ICD-10-CM | POA: Diagnosis not present

## 2019-01-16 DIAGNOSIS — F411 Generalized anxiety disorder: Secondary | ICD-10-CM
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
## 2019-02-08 ENCOUNTER — Ambulatory Visit (HOSPITAL_COMMUNITY): Payer: Managed Care, Other (non HMO) | Admitting: Licensed Clinical Social Worker

## 2019-10-28 ENCOUNTER — Emergency Department (HOSPITAL_COMMUNITY): Payer: 59

## 2019-10-28 ENCOUNTER — Encounter (HOSPITAL_COMMUNITY): Payer: Self-pay

## 2019-10-28 ENCOUNTER — Emergency Department (HOSPITAL_COMMUNITY)
Admission: EM | Admit: 2019-10-28 | Discharge: 2019-10-29 | Disposition: A | Discharge status: Home / Self Care | Payer: 59 | Attending: Emergency Medicine | Admitting: Emergency Medicine

## 2019-10-28 ENCOUNTER — Other Ambulatory Visit: Payer: Self-pay

## 2019-10-28 DIAGNOSIS — Z20822 Contact with and (suspected) exposure to covid-19: Secondary | ICD-10-CM | POA: Diagnosis not present

## 2019-10-28 DIAGNOSIS — S62614B Displaced fracture of proximal phalanx of right ring finger, initial encounter for open fracture: Secondary | ICD-10-CM | POA: Diagnosis not present

## 2019-10-28 DIAGNOSIS — S61411A Laceration without foreign body of right hand, initial encounter: Secondary | ICD-10-CM

## 2019-10-28 DIAGNOSIS — Z91040 Latex allergy status: Secondary | ICD-10-CM | POA: Insufficient documentation

## 2019-10-28 DIAGNOSIS — S62306B Unspecified fracture of fifth metacarpal bone, right hand, initial encounter for open fracture: Secondary | ICD-10-CM

## 2019-10-28 DIAGNOSIS — S62316B Displaced fracture of base of fifth metacarpal bone, right hand, initial encounter for open fracture: Secondary | ICD-10-CM | POA: Diagnosis not present

## 2019-10-28 DIAGNOSIS — S6991XA Unspecified injury of right wrist, hand and finger(s), initial encounter: Secondary | ICD-10-CM | POA: Diagnosis present

## 2019-10-28 MED ORDER — HYDROMORPHONE HCL 1 MG/ML IJ SOLN
1.0000 mg | Freq: Once | INTRAMUSCULAR | Status: AC
  Administered 2019-10-28: 1 mg via INTRAVENOUS
  Filled 2019-10-28: qty 1

## 2019-10-28 MED ORDER — CLINDAMYCIN PHOSPHATE 600 MG/50ML IV SOLN
600.0000 mg | Freq: Once | INTRAVENOUS | Status: AC
  Administered 2019-10-28: 600 mg via INTRAVENOUS
  Filled 2019-10-28: qty 50
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
## 2019-10-29 LAB — CBC
HCT: 39 % (ref 36.0–46.0)
Hemoglobin: 12.2 g/dL (ref 12.0–15.0)
MCH: 25.6 pg — ABNORMAL LOW (ref 26.0–34.0)
MCHC: 31.3 g/dL (ref 30.0–36.0)
MCV: 81.9 fL (ref 80.0–100.0)
Platelets: 326 10*3/uL (ref 150–400)
RBC: 4.76 MIL/uL (ref 3.87–5.11)
RDW: 14.2 % (ref 11.5–15.5)
WBC: 13.7 10*3/uL — ABNORMAL HIGH (ref 4.0–10.5)
nRBC: 0 % (ref 0.0–0.2)

## 2019-10-29 LAB — BASIC METABOLIC PANEL WITH GFR
Anion gap: 11 (ref 5–15)
BUN: 8 mg/dL (ref 6–20)
CO2: 24 mmol/L (ref 22–32)
Calcium: 9.2 mg/dL (ref 8.9–10.3)
Chloride: 104 mmol/L (ref 98–111)
Creatinine, Ser: 0.78 mg/dL (ref 0.44–1.00)
GFR calc Af Amer: >60 mL/min
GFR calc non Af Amer: >60 mL/min
Glucose, Bld: 146 mg/dL — ABNORMAL HIGH (ref 70–99)
Potassium: 4.1 mmol/L (ref 3.5–5.1)
Sodium: 139 mmol/L (ref 135–145)

## 2019-10-29 LAB — RESPIRATORY PANEL BY RT PCR (FLU A&B, COVID)
Influenza A by PCR: NEGATIVE
Influenza B by PCR: NEGATIVE
SARS Coronavirus 2 by RT PCR: NEGATIVE

## 2019-10-29 MED ORDER — ACETAMINOPHEN 500 MG PO TABS: 1000.0000 mg | ORAL_TABLET | Freq: Three times a day (TID) | ORAL | 0 refills | Status: AC

## 2019-10-29 MED ORDER — ONDANSETRON 4 MG PO TBDP: 4.0000 mg | ORAL_TABLET | Freq: Three times a day (TID) | ORAL | 0 refills | Status: AC | PRN

## 2019-10-29 MED ORDER — FENTANYL CITRATE (PF) 100 MCG/2ML IJ SOLN
75.0000 ug | Freq: Once | INTRAMUSCULAR | Status: AC
  Administered 2019-10-29: 75 ug via INTRAVENOUS
  Filled 2019-10-29: qty 2

## 2019-10-29 MED ORDER — ACETAMINOPHEN 500 MG PO TABS
1000.0000 mg | ORAL_TABLET | Freq: Once | ORAL | Status: AC
  Administered 2019-10-29: 1000 mg via ORAL
  Filled 2019-10-29: qty 2

## 2019-10-29 MED ORDER — OXYCODONE HCL 5 MG PO TABS
5.0000 mg | ORAL_TABLET | Freq: Once | ORAL | Status: AC
  Administered 2019-10-29: 5 mg via ORAL
  Filled 2019-10-29: qty 1

## 2019-10-29 MED ORDER — CLINDAMYCIN HCL 75 MG PO CAPS: 300.0000 mg | ORAL_CAPSULE | Freq: Three times a day (TID) | ORAL | 0 refills | Status: AC

## 2019-10-29 MED ORDER — OXYCODONE HCL 5 MG PO TABS: 2.5000 mg | ORAL_TABLET | ORAL | 0 refills | Status: DC | PRN

## 2019-10-29 NOTE — Progress Notes (Signed)
Orthopedic Tech Progress Note Patient Details:  Amanda Wilson [birth year] [id number]  Ortho Devices Type of Ortho Device: Ulna gutter splint Ortho Device/Splint Location: RUE Ortho Device/Splint Interventions: Ordered, Application, Adjustment   Post Interventions Patient Tolerated: Well Instructions Provided: Care of device, Poper ambulation with device   Amanda Wilson 10/29/2019, 2:25 AM
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
## 2019-10-29 NOTE — Discharge Instructions (Signed)
For pain control you may take at 1000 mg of Tylenol every 8 hours scheduled.  In addition you can take 0.5 to 1 tablet of Oxycodone every 4-6 hours for pain not controlled with the scheduled Tylenol.

## 2019-10-30 ENCOUNTER — Other Ambulatory Visit: Payer: Self-pay | Admitting: Orthopedic Surgery

## 2019-11-02 ENCOUNTER — Encounter (HOSPITAL_COMMUNITY): Payer: Self-pay | Admitting: Orthopedic Surgery

## 2019-11-02 NOTE — Progress Notes (Signed)
Ms Amanda Wilson denies chest pain or shortness of breath. Patient denies that she nor anyone in the home have any s/s of Covid or been exposed to anyone with s/s. Amanda Wilson will be tested on Saturday at 1000.
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
## 2019-11-03 ENCOUNTER — Other Ambulatory Visit (HOSPITAL_COMMUNITY)
Admission: RE | Admit: 2019-11-03 | Discharge: 2019-11-03 | Disposition: A | Discharge status: Home / Self Care | Payer: 59 | Source: Ambulatory Visit | Attending: Orthopedic Surgery | Admitting: Orthopedic Surgery

## 2019-11-03 DIAGNOSIS — Z20822 Contact with and (suspected) exposure to covid-19: Secondary | ICD-10-CM | POA: Insufficient documentation

## 2019-11-03 DIAGNOSIS — Z01812 Encounter for preprocedural laboratory examination: Secondary | ICD-10-CM | POA: Diagnosis present

## 2019-11-03 LAB — SARS CORONAVIRUS 2 (TAT 6-24 HRS): SARS Coronavirus 2: NEGATIVE

## 2019-11-05 ENCOUNTER — Ambulatory Visit (HOSPITAL_COMMUNITY)
Admission: RE | Admit: 2019-11-05 | Discharge: 2019-11-05 | Disposition: A | Discharge status: Home / Self Care | Payer: 59 | Attending: Orthopedic Surgery | Admitting: Orthopedic Surgery

## 2019-11-05 ENCOUNTER — Encounter (HOSPITAL_COMMUNITY): Payer: Self-pay | Admitting: Orthopedic Surgery

## 2019-11-05 ENCOUNTER — Encounter (HOSPITAL_COMMUNITY)
Admission: RE | Disposition: A | Discharge status: Home / Self Care | Payer: Self-pay | Source: Home / Self Care | Attending: Orthopedic Surgery

## 2019-11-05 ENCOUNTER — Ambulatory Visit (HOSPITAL_COMMUNITY): Payer: 59

## 2019-11-05 ENCOUNTER — Ambulatory Visit (HOSPITAL_COMMUNITY): Payer: 59 | Admitting: Physician Assistant

## 2019-11-05 DIAGNOSIS — S62614B Displaced fracture of proximal phalanx of right ring finger, initial encounter for open fracture: Secondary | ICD-10-CM | POA: Insufficient documentation

## 2019-11-05 DIAGNOSIS — S62336B Displaced fracture of neck of fifth metacarpal bone, right hand, initial encounter for open fracture: Secondary | ICD-10-CM | POA: Insufficient documentation

## 2019-11-05 DIAGNOSIS — Z79899 Other long term (current) drug therapy: Secondary | ICD-10-CM | POA: Insufficient documentation

## 2019-11-05 DIAGNOSIS — G43909 Migraine, unspecified, not intractable, without status migrainosus: Secondary | ICD-10-CM | POA: Diagnosis not present

## 2019-11-05 DIAGNOSIS — Z91040 Latex allergy status: Secondary | ICD-10-CM | POA: Insufficient documentation

## 2019-11-05 DIAGNOSIS — W2210XA Striking against or struck by unspecified automobile airbag, initial encounter: Secondary | ICD-10-CM | POA: Insufficient documentation

## 2019-11-05 DIAGNOSIS — Z419 Encounter for procedure for purposes other than remedying health state, unspecified: Secondary | ICD-10-CM

## 2019-11-05 DIAGNOSIS — Z881 Allergy status to other antibiotic agents status: Secondary | ICD-10-CM | POA: Diagnosis not present

## 2019-11-05 HISTORY — DX: Family history of other specified conditions: Z84.89

## 2019-11-05 HISTORY — PX: OPEN REDUCTION INTERNAL FIXATION (ORIF) PROXIMAL PHALANX: SHX6235

## 2019-11-05 HISTORY — DX: Pneumonia, unspecified organism: J18.9

## 2019-11-05 HISTORY — DX: Dyspnea, unspecified: R06.00

## 2019-11-05 LAB — POCT PREGNANCY, URINE: Preg Test, Ur: NEGATIVE

## 2019-11-05 SURGERY — OPEN REDUCTION INTERNAL FIXATION (ORIF) PROXIMAL PHALANX
Anesthesia: Monitor Anesthesia Care | Laterality: Right

## 2019-11-05 MED ORDER — LACTATED RINGERS IV SOLN: INTRAVENOUS | Status: DC

## 2019-11-05 MED ORDER — FENTANYL CITRATE (PF) 100 MCG/2ML IJ SOLN
INTRAMUSCULAR | Status: DC | PRN
  Administered 2019-11-05 (×2): 50 ug via INTRAVENOUS

## 2019-11-05 MED ORDER — MIDAZOLAM HCL 5 MG/5ML IJ SOLN
INTRAMUSCULAR | Status: DC | PRN
  Administered 2019-11-05: 2 mg via INTRAVENOUS

## 2019-11-05 MED ORDER — FENTANYL CITRATE (PF) 100 MCG/2ML IJ SOLN
INTRAMUSCULAR | Status: AC
  Administered 2019-11-05: 100 ug via INTRAVENOUS
  Filled 2019-11-05: qty 2

## 2019-11-05 MED ORDER — ACETAMINOPHEN 160 MG/5ML PO SOLN: 325.0000 mg | ORAL | Status: DC | PRN

## 2019-11-05 MED ORDER — KETOROLAC TROMETHAMINE 30 MG/ML IJ SOLN
30.0000 mg | Freq: Once | INTRAMUSCULAR | Status: AC
  Administered 2019-11-05: 30 mg via INTRAVENOUS

## 2019-11-05 MED ORDER — FENTANYL CITRATE (PF) 100 MCG/2ML IJ SOLN: 100.0000 ug | Freq: Once | INTRAMUSCULAR | Status: AC

## 2019-11-05 MED ORDER — LIDOCAINE HCL (PF) 1 % IJ SOLN
INTRAMUSCULAR | Status: DC | PRN
  Administered 2019-11-05: 10 mL

## 2019-11-05 MED ORDER — ONDANSETRON HCL 4 MG/2ML IJ SOLN: 4.0000 mg | Freq: Once | INTRAMUSCULAR | Status: DC | PRN

## 2019-11-05 MED ORDER — ONDANSETRON HCL 4 MG/2ML IJ SOLN
INTRAMUSCULAR | Status: AC
  Filled 2019-11-05: qty 2

## 2019-11-05 MED ORDER — KETOROLAC TROMETHAMINE 30 MG/ML IJ SOLN
INTRAMUSCULAR | Status: AC
  Filled 2019-11-05: qty 1

## 2019-11-05 MED ORDER — MIDAZOLAM HCL 2 MG/2ML IJ SOLN
INTRAMUSCULAR | Status: AC
  Filled 2019-11-05: qty 2

## 2019-11-05 MED ORDER — CHLORHEXIDINE GLUCONATE 0.12 % MT SOLN
OROMUCOSAL | Status: AC
  Administered 2019-11-05: 15 mL
  Filled 2019-11-05: qty 15

## 2019-11-05 MED ORDER — ACETAMINOPHEN 325 MG PO TABS
325.0000 mg | ORAL_TABLET | ORAL | Status: DC | PRN
  Administered 2019-11-05: 650 mg via ORAL

## 2019-11-05 MED ORDER — 0.9 % SODIUM CHLORIDE (POUR BTL) OPTIME
TOPICAL | Status: DC | PRN
  Administered 2019-11-05: 1000 mL

## 2019-11-05 MED ORDER — ACETAMINOPHEN 325 MG PO TABS
ORAL_TABLET | ORAL | Status: AC
  Filled 2019-11-05: qty 2

## 2019-11-05 MED ORDER — HYDROMORPHONE HCL 1 MG/ML IJ SOLN
INTRAMUSCULAR | Status: AC
  Filled 2019-11-05: qty 1

## 2019-11-05 MED ORDER — CLINDAMYCIN PHOSPHATE 900 MG/50ML IV SOLN
900.0000 mg | INTRAVENOUS | Status: AC
  Administered 2019-11-05: 900 mg via INTRAVENOUS
  Filled 2019-11-05: qty 50

## 2019-11-05 MED ORDER — PROPOFOL 10 MG/ML IV BOLUS
INTRAVENOUS | Status: DC | PRN
  Administered 2019-11-05 (×2): 20 mg via INTRAVENOUS

## 2019-11-05 MED ORDER — LIDOCAINE HCL (PF) 1 % IJ SOLN
INTRAMUSCULAR | Status: AC
  Filled 2019-11-05: qty 30

## 2019-11-05 MED ORDER — ONDANSETRON HCL 4 MG/2ML IJ SOLN
INTRAMUSCULAR | Status: DC | PRN
  Administered 2019-11-05: 4 mg via INTRAVENOUS

## 2019-11-05 MED ORDER — PROPOFOL 500 MG/50ML IV EMUL
INTRAVENOUS | Status: DC | PRN
  Administered 2019-11-05 (×2): 150 ug/kg/min via INTRAVENOUS

## 2019-11-05 MED ORDER — BUPIVACAINE HCL (PF) 0.5 % IJ SOLN
INTRAMUSCULAR | Status: DC | PRN
  Administered 2019-11-05: 20 mL via PERINEURAL

## 2019-11-05 MED ORDER — MIDAZOLAM HCL 2 MG/2ML IJ SOLN: 2.0000 mg | Freq: Once | INTRAMUSCULAR | Status: DC

## 2019-11-05 MED ORDER — PHENYLEPHRINE 40 MCG/ML (10ML) SYRINGE FOR IV PUSH (FOR BLOOD PRESSURE SUPPORT)
PREFILLED_SYRINGE | INTRAVENOUS | Status: AC
  Filled 2019-11-05: qty 10

## 2019-11-05 MED ORDER — HYDROMORPHONE HCL 1 MG/ML IJ SOLN
0.2500 mg | INTRAMUSCULAR | Status: DC | PRN
  Administered 2019-11-05 (×4): 0.5 mg via INTRAVENOUS

## 2019-11-05 MED ORDER — HYDROMORPHONE HCL 1 MG/ML IJ SOLN
INTRAMUSCULAR | Status: DC
Start: 2019-11-05 — End: 2019-11-05
  Filled 2019-11-05: qty 1

## 2019-11-05 MED ORDER — PROPOFOL 10 MG/ML IV BOLUS
INTRAVENOUS | Status: AC
  Filled 2019-11-05: qty 20

## 2019-11-05 MED ORDER — FENTANYL CITRATE (PF) 250 MCG/5ML IJ SOLN
INTRAMUSCULAR | Status: AC
  Filled 2019-11-05: qty 5

## 2019-11-05 SURGICAL SUPPLY — 72 items
BAND RUBBER #18 3X1/16 STRL (MISCELLANEOUS) ×2 IMPLANT
BIT DRILL 1.1 (BIT) ×1
BIT DRILL 60X20X1.1XQC TMX (BIT) ×1 IMPLANT
BIT DRL 60X20X1.1XQC TMX (BIT) ×1
BLADE MINI RND TIP GREEN BEAV (BLADE) IMPLANT
BLADE SURG 15 STRL LF DISP TIS (BLADE) ×1 IMPLANT
BLADE SURG 15 STRL SS (BLADE) ×1
BNDG COHESIVE 2X5 TAN STRL LF (GAUZE/BANDAGES/DRESSINGS) IMPLANT
BNDG COHESIVE 4X5 TAN STRL (GAUZE/BANDAGES/DRESSINGS) ×4 IMPLANT
BNDG ESMARK 4X9 LF (GAUZE/BANDAGES/DRESSINGS) IMPLANT
BNDG GAUZE ELAST 4 BULKY (GAUZE/BANDAGES/DRESSINGS) ×4 IMPLANT
CAP PIN PROTECTOR ORTHO WHT (CAP) IMPLANT
CHLORAPREP W/TINT 26 (MISCELLANEOUS) ×2 IMPLANT
CORD BIPOLAR FORCEPS 12FT (ELECTRODE) ×2 IMPLANT
COVER BACK TABLE 60X90IN (DRAPES) ×2 IMPLANT
COVER MAYO STAND STRL (DRAPES) ×2 IMPLANT
COVER WAND RF STERILE (DRAPES) IMPLANT
CUFF TOURN SGL QUICK 18X4 (TOURNIQUET CUFF) IMPLANT
CUFF TOURN SGL QUICK 24 (TOURNIQUET CUFF) ×1
CUFF TRNQT CYL 24X4X16.5-23 (TOURNIQUET CUFF) ×1 IMPLANT
DECANTER SPIKE VIAL GLASS SM (MISCELLANEOUS) ×2 IMPLANT
DRAPE C-ARM 42X72 X-RAY (DRAPES) ×2 IMPLANT
DRAPE EXTREMITY T 121X128X90 (DISPOSABLE) ×2 IMPLANT
DRAPE SURG 17X23 STRL (DRAPES) ×2 IMPLANT
DRIVER BIT 1.5 (TRAUMA) ×2 IMPLANT
DRSG ADAPTIC 3X8 NADH LF (GAUZE/BANDAGES/DRESSINGS) ×2 IMPLANT
DRSG EMULSION OIL 3X3 NADH (GAUZE/BANDAGES/DRESSINGS) ×2 IMPLANT
GAUZE SPONGE 4X4 12PLY STRL (GAUZE/BANDAGES/DRESSINGS) ×2 IMPLANT
GAUZE SPONGE 4X4 12PLY STRL LF (GAUZE/BANDAGES/DRESSINGS) ×2 IMPLANT
GLOVE BIO SURGEON STRL SZ 6.5 (GLOVE) ×2 IMPLANT
GLOVE BIO SURGEON STRL SZ7.5 (GLOVE) IMPLANT
GLOVE BIOGEL PI IND STRL 7.0 (GLOVE) ×1 IMPLANT
GLOVE BIOGEL PI IND STRL 8 (GLOVE) ×1 IMPLANT
GLOVE BIOGEL PI INDICATOR 7.0 (GLOVE) ×1
GLOVE BIOGEL PI INDICATOR 8 (GLOVE) ×1
GLOVE ECLIPSE 6.5 STRL STRAW (GLOVE) ×2 IMPLANT
GLOVE SURG SS PI 6.0 STRL IVOR (GLOVE) ×2 IMPLANT
GLOVE SURG SS PI 6.5 STRL IVOR (GLOVE) ×2 IMPLANT
GLOVE SURG SS PI 7.5 STRL IVOR (GLOVE) ×2 IMPLANT
GOWN STRL REUS W/ TWL LRG LVL3 (GOWN DISPOSABLE) ×2 IMPLANT
GOWN STRL REUS W/TWL LRG LVL3 (GOWN DISPOSABLE) ×2
GOWN STRL REUS W/TWL XL LVL3 (GOWN DISPOSABLE) ×2 IMPLANT
K-WIRE .045X4 (WIRE) IMPLANT
K-WIRE .045X6 DBL TRO NS (WIRE) ×2
K-WIRE 9  SMOOTH .045 (WIRE) IMPLANT
KWIRE .045X6 DBL TRO NS (WIRE) ×1 IMPLANT
NEEDLE HYPO 25X1 1.5 SAFETY (NEEDLE) IMPLANT
NS IRRIG 1000ML POUR BTL (IV SOLUTION) ×2 IMPLANT
PACK BASIN DAY SURGERY FS (CUSTOM PROCEDURE TRAY) ×2 IMPLANT
PAD CAST 4YDX4 CTTN HI CHSV (CAST SUPPLIES) ×1 IMPLANT
PADDING CAST ABS 4INX4YD NS (CAST SUPPLIES)
PADDING CAST ABS COTTON 4X4 ST (CAST SUPPLIES) IMPLANT
PADDING CAST COTTON 4X4 STRL (CAST SUPPLIES) ×1
PLATE T SMALL 1.5MM (Plate) ×2 IMPLANT
SCREW L 1.5X12 (Screw) ×6 IMPLANT
SCREW L 1.5X14 (Screw) ×2 IMPLANT
SCREW LOCKING 1.5X10 (Screw) ×6 IMPLANT
SCREW LOCKING 1.5X11MM (Screw) ×2 IMPLANT
SPLINT PLASTER CAST XFAST 3X15 (CAST SUPPLIES) IMPLANT
SPLINT PLASTER XTRA FASTSET 3X (CAST SUPPLIES)
STOCKINETTE 6  STRL (DRAPES)
STOCKINETTE 6 STRL (DRAPES) IMPLANT
SUCTION FRAZIER HANDLE 10FR (MISCELLANEOUS)
SUCTION TUBE FRAZIER 10FR DISP (MISCELLANEOUS) IMPLANT
SUT VICRYL RAPIDE 4-0 (SUTURE) ×2 IMPLANT
SUT VICRYL RAPIDE 4/0 PS 2 (SUTURE) ×6 IMPLANT
SYR 10ML LL (SYRINGE) IMPLANT
SYR BULB EAR ULCER 3OZ GRN STR (SYRINGE) IMPLANT
SYR CONTROL 10ML LL (SYRINGE) ×2 IMPLANT
TOWEL GREEN STERILE FF (TOWEL DISPOSABLE) ×2 IMPLANT
TUBE CONNECTING 20X1/4 (TUBING) IMPLANT
UNDERPAD 30X36 HEAVY ABSORB (UNDERPADS AND DIAPERS) ×2 IMPLANT
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
## 2019-11-05 NOTE — Interval H&P Note (Signed)
History and Physical Interval Note:  11/05/2019 10:22 AM  Amanda Wilson  has presented today for surgery, with the diagnosis of OPEN FRACTURE OF RIGHT RING FINGER PROXIMAL PHALANX AND 5TH METACARPAL.  The various methods of treatment have been discussed with the patient and family. After consideration of risks, benefits and other options for treatment, the patient has consented to  Procedure(s) with comments: OPEN TREATMENT OF RIGHT RING FINGER FRACTURE AND 5TH METACARPAL FRACTURE (Right) - LENGTH OF SURGERY: 90 MIN  PRE OP BLOCK as a surgical intervention.  The patient's history has been reviewed, patient examined, no change in status, stable for surgery.  I have reviewed the patient's chart and labs.  Questions were answered to the patient's satisfaction.     [name]

## 2019-11-05 NOTE — Anesthesia Procedure Notes (Signed)
Anesthesia Regional Block: Supraclavicular block   Pre-Anesthetic Checklist: ,, timeout performed, Correct Patient, Correct Site, Correct Laterality, Correct Procedure, Correct Position, site marked, Risks and benefits discussed,  Surgical consent,  Pre-op evaluation,  At surgeon's request and post-op pain management  Laterality: Right  Prep: Dura Prep       Needles:  Injection technique: Single-shot  Needle Type: Echogenic Stimulator Needle      Needle Gauge: 20     Additional Needles:   Procedures:,,,, ultrasound used (permanent image in chart),,,,  Narrative:  Start time: 11/05/2019 10:15 AM End time: 11/05/2019 10:20 AM Injection made incrementally with aspirations every 5 mL.  Performed by: Personally  Anesthesiologist: Darral Dash, DO  Additional Notes: Patient identified. Risks/Benefits/Options discussed with patient including but not limited to bleeding, infection, nerve damage, failed block, incomplete pain control. Patient expressed understanding and wished to proceed. All questions were answered. Sterile technique was used throughout the entire procedure. Please see nursing notes for vital signs. Aspirated in 5cc intervals with injection for negative confirmation. Patient was given instructions on fall risk and not to get out of bed. All questions and concerns addressed with instructions to call with any issues or inadequate analgesia.
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
## 2019-11-05 NOTE — Transfer of Care (Signed)
Immediate Anesthesia Transfer of Care Note  Patient: Amanda Wilson  Procedure(s) Performed: OPEN TREATMENT OF RIGHT RING FINGER FRACTURE AND 5TH METACARPAL FRACTURE (Right )  Patient Location: PACU  Anesthesia Type:MAC combined with regional for post-op pain  Level of Consciousness: awake, alert  and oriented  Airway & Oxygen Therapy: Patient Spontanous Breathing and Patient connected to nasal cannula oxygen  Post-op Assessment: Report given to RN and Post -op Vital signs reviewed and stable  Post vital signs: Reviewed and stable  Last Vitals:  Vitals Value Taken Time  BP 154/90 11/05/19 1207  Temp    Pulse 102 11/05/19 1210  Resp 24 11/05/19 1210  SpO2 95 % 11/05/19 1210  Vitals shown include unvalidated device data.  Last Pain:  Vitals:   11/05/19 0802  TempSrc: Oral         Complications: No complications documented.
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
## 2019-11-05 NOTE — Anesthesia Postprocedure Evaluation (Signed)
Anesthesia Post Note  Patient: Microbiologist  Procedure(s) Performed: OPEN TREATMENT OF RIGHT RING FINGER FRACTURE AND 5TH METACARPAL FRACTURE (Right )     Patient location during evaluation: PACU Anesthesia Type: Regional and MAC Level of consciousness: awake and alert Pain management: pain level controlled Vital Signs Assessment: post-procedure vital signs reviewed and stable Respiratory status: spontaneous breathing, nonlabored ventilation, respiratory function stable and patient connected to nasal cannula oxygen Cardiovascular status: stable and blood pressure returned to baseline Postop Assessment: no apparent nausea or vomiting Anesthetic complications: no   No complications documented.  Last Vitals:  Vitals:   11/05/19 1311 11/05/19 1316  BP:  111/75  Pulse: (!) 104 (!) 106  Resp:    Temp:  (!) 36.3 C  SpO2: 98% 97%    Last Pain:  Vitals:   11/05/19 1252  TempSrc:   PainSc: 5                  Josedejesus Marcum P Prynce Jacober

## 2019-11-06 ENCOUNTER — Encounter (HOSPITAL_COMMUNITY): Payer: Self-pay | Admitting: Orthopedic Surgery

## 2020-03-31 ENCOUNTER — Other Ambulatory Visit: Payer: Self-pay | Admitting: Orthopedic Surgery

## 2020-05-02 ENCOUNTER — Other Ambulatory Visit (HOSPITAL_COMMUNITY)
Admission: RE | Admit: 2020-05-02 | Discharge: 2020-05-02 | Disposition: A | Discharge status: Home / Self Care | Payer: 59 | Source: Ambulatory Visit | Attending: Orthopedic Surgery | Admitting: Orthopedic Surgery

## 2020-05-02 ENCOUNTER — Encounter (HOSPITAL_COMMUNITY): Payer: Self-pay | Admitting: Orthopedic Surgery

## 2020-05-02 DIAGNOSIS — Z01812 Encounter for preprocedural laboratory examination: Secondary | ICD-10-CM | POA: Insufficient documentation

## 2020-05-02 DIAGNOSIS — Z20822 Contact with and (suspected) exposure to covid-19: Secondary | ICD-10-CM | POA: Diagnosis not present

## 2020-05-02 NOTE — Progress Notes (Signed)
Spoke with pt for pre-op call. Pt denies cardiac history, HTN or Diabetes. Pt has hx of anxiety and states she was extremely anxious with her last surgery and feels that may have caused the nerve block not to work. She would like some anxiety medication prior to the nerve block if possible.   Covid test done today. She states that she had Covid in February but only had a home test done then and doesn't have proof of the test. Pt states she's been in quarantine since the test was done and understands that she stays in quarantine until she comes to the hospital on Monday.

## 2020-05-02 NOTE — H&P (Signed)
Worker's Comp: No Date of Injury or Onset: 10-28-19 DOS: 11-05-19 Procedure: ORIF R 5 MC fx, closure web wound, nonop RF P1 fx History: CC / Reason for Visit: Right hand follow-up HPI: This patient returns for reevaluation, now 5-1/2 months postop, not having made any gains despite a very concerted effort over the past 4 weeks.  She still has some shooting nerve related pain into the web space and the adjacent digits, some of which seems postural in her hand is placed in a dependent position  HPI 03-27-20: This patient returns for reevaluation, now approaching 5 months postop.  Despite her best efforts, she continues with fairly pronounced contracture of the small finger MP joint.  She also reports some tingling in the ring and small fingers, and sensitivity at the proximal aspect of her palmar scar.  Review of systems as related to current complaint reviewed and unchanged.  Exam:  Vitals: Refer to EMR. Constitutional:  WD, WN, NAD HEENT:  NCAT, EOMI Neuro/Psych:  Alert & oriented to person, place, and time; appropriate mood & affect Lymphatic: No generalized UE edema or lymphadenopathy Extremities / MSK:  Both UE are normal with respect to appearance, ranges of motion, joint stability, muscle strength/tone, sensation, & perfusion except as otherwise noted:  The laceration continues to mature.  Range of motion has not really changed with small finger flexion to 35, 95, 80 and ring finger MP to 70.  Full extension.  Good abduction and adduction.  Moderate film and testing 2.83 across the radial ulnar aspects of all the digits.  Labs / Xrays:  None today.  X-rays from 03-27-20: 3 views of the right hand ordered and obtained today reveal intact plate and screw fixation of healed distal right fifth metacarpal fracture and ring finger P1 fracture  Assessment:  Healed right hand fractures, with continued tingling in the ring and small fingers and scar sensitivity, with pronounced MP extension  contracture  Plan: I reviewed today's findings with her.  We will plan to proceed with a small finger MCP release and hardware removal.  The details of the operative procedure were discussed with the patient.  Questions were invited and answered.  In addition to the goal of the procedure, the risks of the procedure to include but not limited to bleeding; infection; damage to the nerves or blood vessels that could result in bleeding, numbness, weakness, chronic pain, and the need for additional procedures; stiffness; the need for revision surgery; and anesthetic risks were reviewed.  No specific outcome was guaranteed or implied.  Informed consent was obtained.  We will also have her start therapy 2-3 days postop.   Autoauthenticated,  Rayvon Char. Grandville Silos, MD  CC: None

## 2020-05-03 LAB — SARS CORONAVIRUS 2 (TAT 6-24 HRS): SARS Coronavirus 2: NEGATIVE

## 2020-05-05 ENCOUNTER — Ambulatory Visit (HOSPITAL_COMMUNITY): Payer: 59 | Admitting: Certified Registered Nurse Anesthetist

## 2020-05-05 ENCOUNTER — Ambulatory Visit (HOSPITAL_COMMUNITY): Payer: 59

## 2020-05-05 ENCOUNTER — Other Ambulatory Visit: Payer: Self-pay

## 2020-05-05 ENCOUNTER — Encounter (HOSPITAL_COMMUNITY)
Admission: RE | Disposition: A | Discharge status: Home / Self Care | Payer: Self-pay | Source: Home / Self Care | Attending: Orthopedic Surgery

## 2020-05-05 ENCOUNTER — Ambulatory Visit (HOSPITAL_COMMUNITY)
Admission: RE | Admit: 2020-05-05 | Discharge: 2020-05-05 | Disposition: A | Discharge status: Home / Self Care | Payer: 59 | Attending: Orthopedic Surgery | Admitting: Orthopedic Surgery

## 2020-05-05 ENCOUNTER — Encounter (HOSPITAL_COMMUNITY): Payer: Self-pay | Admitting: Orthopedic Surgery

## 2020-05-05 DIAGNOSIS — M24541 Contracture, right hand: Secondary | ICD-10-CM | POA: Insufficient documentation

## 2020-05-05 DIAGNOSIS — Z472 Encounter for removal of internal fixation device: Secondary | ICD-10-CM | POA: Insufficient documentation

## 2020-05-05 DIAGNOSIS — Z419 Encounter for procedure for purposes other than remedying health state, unspecified: Secondary | ICD-10-CM

## 2020-05-05 HISTORY — DX: Other complications of anesthesia, initial encounter: T88.59XA

## 2020-05-05 HISTORY — DX: Myoneural disorder, unspecified: G70.9

## 2020-05-05 HISTORY — DX: COVID-19: U07.1

## 2020-05-05 HISTORY — PX: HARDWARE REMOVAL: SHX979

## 2020-05-05 LAB — POCT PREGNANCY, URINE: Preg Test, Ur: NEGATIVE

## 2020-05-05 SURGERY — REMOVAL, HARDWARE
Anesthesia: Monitor Anesthesia Care | Laterality: Right

## 2020-05-05 MED ORDER — KETOROLAC TROMETHAMINE 30 MG/ML IJ SOLN: 30.0000 mg | Freq: Once | INTRAMUSCULAR | Status: DC | PRN

## 2020-05-05 MED ORDER — FENTANYL CITRATE (PF) 250 MCG/5ML IJ SOLN
INTRAMUSCULAR | Status: AC
  Filled 2020-05-05: qty 5

## 2020-05-05 MED ORDER — LIDOCAINE 2% (20 MG/ML) 5 ML SYRINGE
INTRAMUSCULAR | Status: AC
  Filled 2020-05-05: qty 5

## 2020-05-05 MED ORDER — LIDOCAINE HCL 1 % IJ SOLN
INTRAMUSCULAR | Status: DC | PRN
  Administered 2020-05-05: 10 mL

## 2020-05-05 MED ORDER — ONDANSETRON HCL 4 MG/2ML IJ SOLN
INTRAMUSCULAR | Status: DC | PRN
  Administered 2020-05-05: 4 mg via INTRAVENOUS

## 2020-05-05 MED ORDER — OXYCODONE HCL 5 MG PO TABS: 5.0000 mg | ORAL_TABLET | Freq: Four times a day (QID) | ORAL | 0 refills | Status: DC | PRN

## 2020-05-05 MED ORDER — ACETAMINOPHEN 500 MG PO TABS
1000.0000 mg | ORAL_TABLET | Freq: Once | ORAL | Status: AC
  Administered 2020-05-05: 1000 mg via ORAL
  Filled 2020-05-05: qty 2

## 2020-05-05 MED ORDER — CLINDAMYCIN PHOSPHATE 900 MG/50ML IV SOLN
900.0000 mg | INTRAVENOUS | Status: AC
  Administered 2020-05-05: 900 mg via INTRAVENOUS
  Filled 2020-05-05: qty 50

## 2020-05-05 MED ORDER — KETAMINE HCL 50 MG/5ML IJ SOSY
PREFILLED_SYRINGE | INTRAMUSCULAR | Status: AC
  Filled 2020-05-05: qty 5

## 2020-05-05 MED ORDER — PROMETHAZINE HCL 25 MG/ML IJ SOLN: 6.2500 mg | INTRAMUSCULAR | Status: DC | PRN

## 2020-05-05 MED ORDER — DEXAMETHASONE SODIUM PHOSPHATE 10 MG/ML IJ SOLN
INTRAMUSCULAR | Status: DC | PRN
  Administered 2020-05-05: 6 mg via INTRAVENOUS

## 2020-05-05 MED ORDER — CHLORHEXIDINE GLUCONATE 0.12 % MT SOLN
15.0000 mL | Freq: Once | OROMUCOSAL | Status: AC
  Administered 2020-05-05: 15 mL via OROMUCOSAL
  Filled 2020-05-05: qty 15

## 2020-05-05 MED ORDER — LIDOCAINE HCL 1 % IJ SOLN
INTRAMUSCULAR | Status: AC
  Filled 2020-05-05: qty 20

## 2020-05-05 MED ORDER — PROPOFOL 10 MG/ML IV BOLUS
INTRAVENOUS | Status: DC | PRN
  Administered 2020-05-05 (×2): 10 mg via INTRAVENOUS

## 2020-05-05 MED ORDER — FENTANYL CITRATE (PF) 250 MCG/5ML IJ SOLN
INTRAMUSCULAR | Status: DC | PRN
  Administered 2020-05-05 (×2): 25 ug via INTRAVENOUS
  Administered 2020-05-05 (×3): 50 ug via INTRAVENOUS

## 2020-05-05 MED ORDER — ONDANSETRON HCL 4 MG/2ML IJ SOLN
INTRAMUSCULAR | Status: AC
  Filled 2020-05-05: qty 2

## 2020-05-05 MED ORDER — ACETAMINOPHEN 325 MG PO TABS: 650.0000 mg | ORAL_TABLET | Freq: Four times a day (QID) | ORAL | Status: DC

## 2020-05-05 MED ORDER — FENTANYL CITRATE (PF) 100 MCG/2ML IJ SOLN: 25.0000 ug | INTRAMUSCULAR | Status: DC | PRN

## 2020-05-05 MED ORDER — IBUPROFEN 200 MG PO TABS: 600.0000 mg | ORAL_TABLET | Freq: Four times a day (QID) | ORAL | Status: DC

## 2020-05-05 MED ORDER — LACTATED RINGERS IV SOLN: INTRAVENOUS | Status: DC

## 2020-05-05 MED ORDER — OXYCODONE HCL 5 MG/5ML PO SOLN: 5.0000 mg | Freq: Once | ORAL | Status: AC | PRN

## 2020-05-05 MED ORDER — PROPOFOL 1000 MG/100ML IV EMUL
INTRAVENOUS | Status: AC
  Filled 2020-05-05: qty 300

## 2020-05-05 MED ORDER — BUPIVACAINE-EPINEPHRINE (PF) 0.5% -1:200000 IJ SOLN
INTRAMUSCULAR | Status: DC | PRN
  Administered 2020-05-05: 10 mL via PERINEURAL

## 2020-05-05 MED ORDER — MIDAZOLAM HCL 5 MG/5ML IJ SOLN
INTRAMUSCULAR | Status: DC | PRN
  Administered 2020-05-05 (×2): 1 mg via INTRAVENOUS
  Administered 2020-05-05: 2 mg via INTRAVENOUS

## 2020-05-05 MED ORDER — ORAL CARE MOUTH RINSE: 15.0000 mL | Freq: Once | OROMUCOSAL | Status: AC

## 2020-05-05 MED ORDER — AMISULPRIDE (ANTIEMETIC) 5 MG/2ML IV SOLN: 10.0000 mg | Freq: Once | INTRAVENOUS | Status: DC | PRN

## 2020-05-05 MED ORDER — OXYCODONE HCL 5 MG PO TABS
5.0000 mg | ORAL_TABLET | Freq: Once | ORAL | Status: AC | PRN
  Administered 2020-05-05: 5 mg via ORAL

## 2020-05-05 MED ORDER — PHENYLEPHRINE HCL-NACL 10-0.9 MG/250ML-% IV SOLN
INTRAVENOUS | Status: AC
  Filled 2020-05-05: qty 500

## 2020-05-05 MED ORDER — PROPOFOL 500 MG/50ML IV EMUL
INTRAVENOUS | Status: DC | PRN
  Administered 2020-05-05: 125 ug/kg/min via INTRAVENOUS

## 2020-05-05 MED ORDER — DEXAMETHASONE SODIUM PHOSPHATE 10 MG/ML IJ SOLN
INTRAMUSCULAR | Status: AC
  Filled 2020-05-05: qty 1

## 2020-05-05 MED ORDER — MIDAZOLAM HCL 2 MG/2ML IJ SOLN
INTRAMUSCULAR | Status: AC
  Filled 2020-05-05: qty 4

## 2020-05-05 MED ORDER — OXYCODONE HCL 5 MG PO TABS
ORAL_TABLET | ORAL | Status: AC
  Filled 2020-05-05: qty 1

## 2020-05-05 MED ORDER — BUPIVACAINE-EPINEPHRINE 0.5% -1:200000 IJ SOLN
INTRAMUSCULAR | Status: AC
  Filled 2020-05-05: qty 1

## 2020-05-05 MED ORDER — PROPOFOL 10 MG/ML IV BOLUS
INTRAVENOUS | Status: AC
  Filled 2020-05-05: qty 40

## 2020-05-05 SURGICAL SUPPLY — 50 items
APL PRP STRL LF DISP 70% ISPRP (MISCELLANEOUS) ×1
APL SKNCLS STERI-STRIP NONHPOA (GAUZE/BANDAGES/DRESSINGS) ×1
BENZOIN TINCTURE PRP APPL 2/3 (GAUZE/BANDAGES/DRESSINGS) ×2 IMPLANT
BNDG CMPR 9X4 STRL LF SNTH (GAUZE/BANDAGES/DRESSINGS) ×1
BNDG COHESIVE 1X5 TAN STRL LF (GAUZE/BANDAGES/DRESSINGS) ×2 IMPLANT
BNDG COHESIVE 2X5 TAN STRL LF (GAUZE/BANDAGES/DRESSINGS) ×2 IMPLANT
BNDG COHESIVE 4X5 TAN STRL (GAUZE/BANDAGES/DRESSINGS) ×2 IMPLANT
BNDG CONFORM 2 STRL LF (GAUZE/BANDAGES/DRESSINGS) ×2 IMPLANT
BNDG ESMARK 4X9 LF (GAUZE/BANDAGES/DRESSINGS) ×2 IMPLANT
BNDG GAUZE ELAST 4 BULKY (GAUZE/BANDAGES/DRESSINGS) ×2 IMPLANT
CHLORAPREP W/TINT 26 (MISCELLANEOUS) ×2 IMPLANT
CLSR STERI-STRIP ANTIMIC 1/2X4 (GAUZE/BANDAGES/DRESSINGS) ×2 IMPLANT
CORD BIPOLAR FORCEPS 12FT (ELECTRODE) ×2 IMPLANT
COVER WAND RF STERILE (DRAPES) ×2 IMPLANT
CUFF TOURN SGL QUICK 24 (TOURNIQUET CUFF) ×2
CUFF TRNQT CYL 24X4X16.5-23 (TOURNIQUET CUFF) ×1 IMPLANT
DRAPE C-ARM 42X72 X-RAY (DRAPES) ×2 IMPLANT
DRAPE SURG 17X23 STRL (DRAPES) ×2 IMPLANT
DRSG EMULSION OIL 3X3 NADH (GAUZE/BANDAGES/DRESSINGS) ×2 IMPLANT
GAUZE SPONGE 4X4 12PLY STRL (GAUZE/BANDAGES/DRESSINGS) ×2 IMPLANT
GLOVE BIO SURGEON STRL SZ 6.5 (GLOVE) ×2 IMPLANT
GLOVE BIO SURGEON STRL SZ7.5 (GLOVE) ×2 IMPLANT
GLOVE SRG 8 PF TXTR STRL LF DI (GLOVE) ×1 IMPLANT
GLOVE SURG UNDER POLY LF SZ7 (GLOVE) ×2 IMPLANT
GLOVE SURG UNDER POLY LF SZ8 (GLOVE) ×2
GOWN STRL REUS W/ TWL XL LVL3 (GOWN DISPOSABLE) ×1 IMPLANT
GOWN STRL REUS W/TWL XL LVL3 (GOWN DISPOSABLE) ×2
K-WIRE DBL TROCAR .035X4 (WIRE) ×2
K-WIRE DBL TROCAR .045X4 (WIRE) ×2
KIT BASIN OR (CUSTOM PROCEDURE TRAY) ×2 IMPLANT
KWIRE DBL TROCAR .035X4 (WIRE) ×1 IMPLANT
KWIRE DBL TROCAR .045X4 (WIRE) ×1 IMPLANT
NEEDLE HYPO 22GX1.5 SAFETY (NEEDLE) ×2 IMPLANT
NS IRRIG 1000ML POUR BTL (IV SOLUTION) ×2 IMPLANT
PACK ORTHO EXTREMITY (CUSTOM PROCEDURE TRAY) ×2 IMPLANT
PADDING CAST COTTON 6X4 STRL (CAST SUPPLIES) ×2 IMPLANT
SPLINT PLASTER CAST XFAST 4X15 (CAST SUPPLIES) ×1 IMPLANT
SPLINT PLASTER XTRA FAST SET 4 (CAST SUPPLIES) ×1
STRIP CLOSURE SKIN 1/2X4 (GAUZE/BANDAGES/DRESSINGS) ×2 IMPLANT
SUT ETHILON 4 0 PS 2 18 (SUTURE) ×2 IMPLANT
SUT VIC AB 3-0 PS2 18 (SUTURE) ×2
SUT VIC AB 3-0 PS2 18XBRD (SUTURE) ×1 IMPLANT
SUT VIC AB 4-0 P-3 18XBRD (SUTURE) ×1 IMPLANT
SUT VIC AB 4-0 P3 18 (SUTURE) ×2
SUT VICRYL RAPIDE 4/0 PS 2 (SUTURE) ×2 IMPLANT
SYR 10ML LL (SYRINGE) ×4 IMPLANT
SYR BULB EAR ULCER 3OZ GRN STR (SYRINGE) ×2 IMPLANT
TOWEL GREEN STERILE FF (TOWEL DISPOSABLE) ×2 IMPLANT
TOWEL OR NON WOVEN STRL DISP B (DISPOSABLE) ×2 IMPLANT
UNDERPAD 30X36 HEAVY ABSORB (UNDERPADS AND DIAPERS) ×2 IMPLANT

## 2020-05-05 NOTE — Anesthesia Preprocedure Evaluation (Addendum)
Anesthesia Evaluation  Patient identified by MRN, date of birth, ID band Patient awake    Reviewed: Allergy & Precautions, NPO status , Patient's Chart, lab work & pertinent test results  Airway Mallampati: II  TM Distance: >3 FB Neck ROM: Full    Dental no notable dental hx.    Pulmonary neg pulmonary ROS,    Pulmonary exam normal breath sounds clear to auscultation       Cardiovascular negative cardio ROS Normal cardiovascular exam Rhythm:Regular Rate:Normal     Neuro/Psych  Headaches, PSYCHIATRIC DISORDERS Anxiety Depression    GI/Hepatic negative GI ROS, Neg liver ROS,   Endo/Other  Morbid obesity  Renal/GU negative Renal ROS     Musculoskeletal negative musculoskeletal ROS (+)   Abdominal   Peds  Hematology negative hematology ROS (+)   Anesthesia Other Findings RIGHT SMALL FINGER CONTRACTURE  Reproductive/Obstetrics                            Anesthesia Physical Anesthesia Plan  ASA: IV  Anesthesia Plan: MAC   Post-op Pain Management:    Induction: Intravenous  PONV Risk Score and Plan: 2 and Ondansetron, Dexamethasone, Propofol infusion, Midazolam and Treatment may vary due to age or medical condition  Airway Management Planned: Simple Face Mask  Additional Equipment:   Intra-op Plan:   Post-operative Plan:   Informed Consent: I have reviewed the patients History and Physical, chart, labs and discussed the procedure including the risks, benefits and alternatives for the proposed anesthesia with the patient or authorized representative who has indicated his/her understanding and acceptance.     Dental advisory given  Plan Discussed with: CRNA  Anesthesia Plan Comments:        Anesthesia Quick Evaluation

## 2020-05-05 NOTE — Transfer of Care (Signed)
Immediate Anesthesia Transfer of Care Note  Patient: Amanda Wilson  Procedure(s) Performed: RIGHT HAND HARDWARE REMOVAL AND SMALL FINGER METACARPOPHALANGEAL JOINT RELEASE (Right )  Patient Location: PACU  Anesthesia Type:MAC  Level of Consciousness: awake, alert , oriented and patient cooperative  Airway & Oxygen Therapy: Patient Spontanous Breathing  Post-op Assessment: Report given to RN, Post -op Vital signs reviewed and stable, Patient moving all extremities X 4 and Patient able to stick tongue midline  Post vital signs: Reviewed and stable  Last Vitals:  Vitals Value Taken Time  BP 141/78 05/05/20 0854  Temp    Pulse 95 05/05/20 0855  Resp 26 05/05/20 0855  SpO2 92 % 05/05/20 0855  Vitals shown include unvalidated device data.  Last Pain:  Vitals:   05/05/20 9924  TempSrc:   PainSc: 2          Complications: No complications documented.

## 2020-05-05 NOTE — Addendum Note (Signed)
Addendum  created 05/05/20 1620 by Murvin Natal, MD   Clinical Note Signed
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
## 2020-05-05 NOTE — Anesthesia Postprocedure Evaluation (Signed)
Anesthesia Post Note  Patient: Microbiologist  Procedure(s) Performed: RIGHT HAND HARDWARE REMOVAL AND SMALL FINGER METACARPOPHALANGEAL JOINT RELEASE (Right )     Patient location during evaluation: PACU Anesthesia Type: Regional Level of consciousness: awake and alert Pain management: pain level controlled Vital Signs Assessment: post-procedure vital signs reviewed and stable Respiratory status: spontaneous breathing, nonlabored ventilation, respiratory function stable and patient connected to nasal cannula oxygen Cardiovascular status: stable and blood pressure returned to baseline Postop Assessment: no apparent nausea or vomiting Anesthetic complications: no   No complications documented.  Last Vitals:  Vitals:   05/05/20 0910 05/05/20 0925  BP: (!) 152/89 (!) 160/81  Pulse: 91 78  Resp: 19 (!) 28  Temp:  37.1 C  SpO2: 94% 95%    Last Pain:  Vitals:   05/05/20 0925  TempSrc:   PainSc: 3                  Buster Schueller P Antiono Ettinger

## 2020-05-05 NOTE — Anesthesia Postprocedure Evaluation (Signed)
Anesthesia Post Note  Patient: Amanda Wilson  Procedure(s) Performed: RIGHT HAND HARDWARE REMOVAL AND SMALL FINGER METACARPOPHALANGEAL JOINT RELEASE (Right )     Patient location during evaluation: PACU Anesthesia Type: MAC Level of consciousness: awake Pain management: pain level controlled Vital Signs Assessment: post-procedure vital signs reviewed and stable Respiratory status: spontaneous breathing, nonlabored ventilation, respiratory function stable and patient connected to nasal cannula oxygen Cardiovascular status: stable and blood pressure returned to baseline Postop Assessment: no apparent nausea or vomiting Anesthetic complications: no   No complications documented.  Last Vitals:  Vitals:   05/05/20 0910 05/05/20 0925  BP: (!) 152/89 (!) 160/81  Pulse: 91 78  Resp: 19 (!) 28  Temp:  37.1 C  SpO2: 94% 95%    Last Pain:  Vitals:   05/05/20 0925  TempSrc:   PainSc: 3                  Shafter Jupin P Dajsha Massaro

## 2020-05-05 NOTE — Discharge Instructions (Signed)
Discharge Instructions   You have a light dressing on your hand.  You may begin gentle motion of your fingers and hand immediately, but you should not do any heavy lifting or gripping.  Elevate your hand to reduce pain & swelling of the digits.  Ice over the operative site may be helpful to reduce pain & swelling.  DO NOT USE HEAT. Pain medicine has been prescribed for you.  Take Tylenol 650 mg and Ibuprofen 600 mg every 6 hours together for pain. Take Oxycodone as needed for severe post operative pain management. Leave the dressing in place until you are seen in therapy. After the bandage has been removed you may shower, regularly washing the incision and letting the water run over it, but not submerging it (no swimming, soaking it in dishwater, etc.) You may drive a car when you are off of prescription pain medications and can safely control your vehicle with both hands. Therapy has already been arranged for this Thursday 05/08/20 You may have already made your follow-up appointment when we completed your preop visit.  If not, please call our office today or the next business day to make your return appointment for 10-15 days after surgery.   Please call [phone] during normal business hours or [phone] after hours for any problems. Including the following:  - excessive redness of the incisions - drainage for more than 4 days - fever of more than 101.5 F  *Please note that pain medications will not be refilled after hours or on weekends.  WORK STATUS: Patient plans to take off 1 week and will return to work on Monday 05/12/20 with pencil and paper task restrictions.

## 2020-05-05 NOTE — Interval H&P Note (Signed)
History and Physical Interval Note:  05/05/2020 7:45 AM  Amanda Wilson  has presented today for surgery, with the diagnosis of RIGHT SMALL FINGER CONTRACTURE.  The various methods of treatment have been discussed with the patient and family. After consideration of risks, benefits and other options for treatment, the patient has consented to  Procedure(s): RIGHT HAND HARDWARE REMOVAL AND SMALL FINGER METACARPOPHALANGEAL JOINT RELEASE (Right) as a surgical intervention.  The patient's history has been reviewed, patient examined, no change in status, stable for surgery.  I have reviewed the patient's chart and labs.  Questions were answered to the patient's satisfaction.     [name]

## 2020-05-06 ENCOUNTER — Encounter (HOSPITAL_COMMUNITY): Payer: Self-pay | Admitting: Orthopedic Surgery

## 2020-08-14 NOTE — Progress Notes (Signed)
Date:  08/15/2020   ID:  Amanda Wilson, DOB Jan 05, 1991, MRN [id number]  PCP:  Patient, No Pcp Per (Inactive)  Cardiologist:  Rex Kras, DO, [facility]  (established care 08/15/2020)  REASON FOR CONSULT: Palpitations  REQUESTING PHYSICIAN:  Shanon Rosser, PA-C [address] [place],  [place] [phone]  Chief Complaint  Patient presents with   Palpitations    HPI  Amanda Wilson is a 30 y.o. female who presents to the office with a chief complaint of " palpitations." Patient's past medical history and cardiovascular risk factors include: anxiety, depression, migraine, obesity due to excess calorie.   She is referred to the office at the request of Long, Nicki Reaper, PA-C for evaluation of palpitations.  Very pleasant young woman who comes in to the office today for evaluation of palpitations and elevated blood pressures.  Palpitations: Patient states that approximately 3 weeks ago she was at her desk sitting at her usual state of health and had onset of dizziness and her smart watch alerted her that her heart rate was at least 140 bpm.  Patient states that the symptoms lasted for a few hours and then self-limited.  No medications were taken but she did try to relax and deep breathing exercises.  She continues to have palpitations on a regular basis.  More noticeable every other day, when present lasts for a few hours and self limited.  No improving or worsening factors.  Known near-syncope or syncopal events.  Occasionally she still gets lightheaded and dizzy at times.  No known thyroid disease.  She does have history of anemia secondary to her monthly periods and recently was started on oral contraceptives approximately 3 to 4 months ago.  She consumes no more than 1 cup of coffee per day.  She has not noticed any significant change in her symptoms with decreasing coffee intake.  No consumptions of sodas, illicit drugs, no new over-the-counter medications, herbal supplements, energy  drinks, stimulants, or weight loss supplements.  Of note, patient is on Topamax for migraine prophylaxis and has been on it for the last 2 to 3 months at a lower dose compared to her dose in the past.  Recently she also has noticed elevated blood pressures.  She does have a blood pressure cuff at home but does not check it on a regular basis.  She could decrease the consumption of salty foods such as canned/processed foods.  She tries to ambulate 15 to 20 minutes a day.  No significant change in functional status.  Patient states that her father has atrial fibrillation requiring multiple atrial fibrillation ablations.  She denies any premature coronary artery disease in the family or sudden cardiac death.  FUNCTIONAL STATUS: Ambulates at least 15 to 20 minutes a day  ALLERGIES: Allergies  Allergen Reactions   Duricef [Cefadroxil]     Skin sloughing   Cephalosporins Rash   Latex Rash    MEDICATION LIST PRIOR TO VISIT: Current Meds  Medication Sig   FLUoxetine (PROZAC) 20 MG capsule Take 20 mg by mouth daily.   hydrOXYzine (ATARAX/VISTARIL) 25 MG tablet SMARTSIG:0.5-2 Tablet(s) By Mouth Twice Daily PRN   melatonin 3 MG TABS tablet Take 3 mg by mouth at bedtime.   Multiple Vitamins-Minerals (HAIR/SKIN/NAILS) CAPS Take 5,000 Units by mouth 3 (three) times daily.   norethindrone (MICRONOR) 0.35 MG tablet Take 1 tablet by mouth daily.   topiramate (TOPAMAX) 25 MG tablet Take 25 mg by mouth at bedtime.   [DISCONTINUED] fexofenadine (QC FEXOFENADINE HYDROCHLORIDE) 180  MG tablet Take 180 mg by mouth daily.     PAST MEDICAL HISTORY: Past Medical History:  Diagnosis Date   Anxiety    Complication of anesthesia    said nerve block didn't work on finger   COVID-19    early February   Depression    Dyspnea    with exertion   Family history of adverse reaction to anesthesia    Father- slow to awaken also- father had part of lung remved for an Empyema.   Frequent headaches    migraines-  maybe 1 once a month since starting to take Topmax on a daily basis   Neuromuscular disorder ([place])    nerve pain in right small finger after surgery   Pneumonia    as a  child    PAST SURGICAL HISTORY: Past Surgical History:  Procedure Laterality Date   HARDWARE REMOVAL Right 05/05/2020   Procedure: RIGHT HAND HARDWARE REMOVAL AND SMALL FINGER METACARPOPHALANGEAL JOINT RELEASE;  Surgeon: Milly Jakob, MD;  Location: [facility];  Service: Orthopedics;  Laterality: Right;   OPEN REDUCTION INTERNAL FIXATION (ORIF) PROXIMAL PHALANX Right 11/05/2019   Procedure: OPEN TREATMENT OF RIGHT RING FINGER FRACTURE AND 5TH METACARPAL FRACTURE;  Surgeon: Milly Jakob, MD;  Location: [place];  Service: Orthopedics;  Laterality: Right;  LENGTH OF SURGERY: 90 MIN  PRE OP BLOCK    FAMILY HISTORY: The patient family history includes Atrial fibrillation in her father and paternal grandfather; Cancer in her mother; Dementia in her maternal grandfather; Depression in her cousin, maternal aunt, mother, and sister; Diabetes in her father; Heart disease in her father; Stroke in her father.  SOCIAL HISTORY:  The patient  reports that she has never smoked. She has never used smokeless tobacco. She reports previous alcohol use. She reports previous drug use. Drug: Marijuana.  REVIEW OF SYSTEMS: Review of Systems  Constitutional: Negative for chills and fever.  HENT:  Negative for hoarse voice and nosebleeds.   Eyes:  Negative for discharge, double vision and pain.  Cardiovascular:  Positive for palpitations. Negative for chest pain, claudication, dyspnea on exertion, leg swelling, near-syncope, orthopnea, paroxysmal nocturnal dyspnea and syncope.  Respiratory:  Negative for hemoptysis and shortness of breath.   Musculoskeletal:  Negative for muscle cramps and myalgias.  Gastrointestinal:  Negative for abdominal pain, constipation, diarrhea, hematemesis, hematochezia, melena, nausea and vomiting.  Neurological:   Positive for dizziness and light-headedness.   PHYSICAL EXAM: Vitals with BMI 08/15/2020 08/15/2020 05/05/2020  Height - '5\' 4"'  -  Weight - 330 lbs -  BMI - 93.23 -  Systolic 557 322 025  Diastolic 90 427 81  Pulse 95 111 78  Some encounter information is confidential and restricted. Go to Review Flowsheets activity to see all data.    CONSTITUTIONAL: Well-developed and well-nourished. No acute distress.  SKIN: Skin is warm and dry. No rash noted. No cyanosis. No pallor. No jaundice HEAD: Normocephalic and atraumatic.  EYES: No scleral icterus MOUTH/THROAT: Moist oral membranes.  NECK: No JVD present. No thyromegaly noted. No carotid bruits  LYMPHATIC: No visible cervical adenopathy.  CHEST Normal respiratory effort. No intercostal retractions  LUNGS: Clear to auscultation bilaterally.  No stridor. No wheezes. No rales.  CARDIOVASCULAR: Regular rate rhythm, positive S1-S2, no murmurs rubs or gallops appreciated. ABDOMINAL: Obese, soft, nontender, nondistended, positive bowel sounds in all 4 quadrants, no apparent ascites.  EXTREMITIES: No peripheral edema, 2+ dorsalis pedis and posterior tibial pulses. HEMATOLOGIC: No significant bruising NEUROLOGIC: Oriented to person, place, and time.  Nonfocal. Normal muscle tone.  PSYCHIATRIC: Normal mood and affect. Normal behavior. Cooperative  CARDIAC DATABASE: EKG: 08/15/2020: Normal sinus rhythm, 83 bpm, without underlying ischemia or injury pattern.   Echocardiogram: No results found for this or any previous visit from the past 1095 days.    Stress Testing: No results found for this or any previous visit from the past 1095 days.   Heart Catheterization: None   LABORATORY DATA: CBC Latest Ref Rng & Units 10/28/2019 02/09/2018 02/18/2017  WBC 4.0 - 10.5 K/uL 13.7(H) 9.2 7.2  Hemoglobin 12.0 - 15.0 g/dL 12.2 11.3 12.2  Hematocrit 36.0 - 46.0 % 39.0 35.9 35.3  Platelets 150 - 400 K/uL 326 288 277    CMP Latest Ref Rng & Units  10/28/2019 02/09/2018 02/18/2017  Glucose 70 - 99 mg/dL 146(H) 96 91  BUN 6 - 20 mg/dL '8 9 10  ' Creatinine 0.44 - 1.00 mg/dL 0.78 0.72 0.61  Sodium 135 - 145 mmol/L 139 139 143  Potassium 3.5 - 5.1 mmol/L 4.1 4.2 4.5  Chloride 98 - 111 mmol/L 104 103 103  CO2 22 - 32 mmol/L '24 20 22  ' Calcium 8.9 - 10.3 mg/dL 9.2 9.1 9.4  Total Protein 6.0 - 8.5 g/dL - 7.0 7.6  Total Bilirubin 0.0 - 1.2 mg/dL - 0.4 0.3  Alkaline Phos 39 - 117 IU/L - 67 64  AST 0 - 40 IU/L - 13 11  ALT 0 - 32 IU/L - 8 13    Lipid Panel  No results found for: CHOL, TRIG, HDL, CHOLHDL, VLDL, LDLCALC, LDLDIRECT, LABVLDL  No components found for: NTPROBNP No results for input(s): PROBNP in the last 8760 hours. No results for input(s): TSH in the last 8760 hours.  BMP Recent Labs    10/28/19 2247  NA 139  K 4.1  CL 104  CO2 24  GLUCOSE 146*  BUN 8  CREATININE 0.78  CALCIUM 9.2  GFRNONAA >60  GFRAA >60    HEMOGLOBIN A1C No results found for: HGBA1C, MPG  External Labs:  Date Collected: 07/26/2020 , information obtained by PCP.  Potassium: 4.2 Creatinine 0.70 mg/dL. eGFR: 118 mL/min per 1.73 m Hemoglobin: 11.7 g/dL and hematocrit: 38.3 % AST: 11 , ALT: 11 , alkaline phosphatase: 83  TSH: 1.12    IMPRESSION:    ICD-10-CM   1. Palpitations  R00.2 EKG 12-Lead    PCV ECHOCARDIOGRAM COMPLETE    LONG TERM MONITOR (3-14 DAYS)    2. Anxiety and depression  F41.9    F32.A     3. Migraines  G43.909     4. Class 3 severe obesity due to excess calories without serious comorbidity with body mass index (BMI) of 50.0 to 59.9 in adult [facility])  E66.01    Z68.43        RECOMMENDATIONS: Amanda Wilson is a 30 y.o. female whose past medical history and cardiac risk factors include: anxiety, depression, migraine, obesity due to excess calorie.  Palpitations: EKG performed and independently reviewed.  Results noted above. 14-day extended Holter monitor to evaluate for underlying dysrhythmias. TSH within normal  limits. No identifiable reversible causes. Echocardiogram will be ordered to evaluate for structural heart disease and left ventricular systolic function. I have asked the patient to speak to her psychiatrist to see if another medication in lieu of Topamax can be used for migraine prophylaxis. No episodes of syncope. Will continue to monitor, further recommendations forthcoming  Elevated blood pressures without diagnosis of hypertension: Office blood pressure not well controlled.  Patient does not check her blood pressures at home. I have asked her to invest in a blood pressure cuff and to keep a log and to bring it in at the next office visit. If her systolic blood pressures are consistently greater than 140 mmHg at home she is asked to call the office for further medication titration.  Obesity, due to excess calories: Body mass index is 56.64 kg/m. I reviewed with the patient the importance of diet, regular physical activity/exercise, weight loss.   Patient is educated on increasing physical activity gradually as tolerated.  With the goal of moderate intensity exercise for 30 minutes a day 5 days a week.  Educated on the importance of improving her modifiable cardiovascular risk factors to decrease chances of cardiovascular comorbidities.  Patient has a family history of atrial fibrillation.  I have asked her to increase physical activity as tolerated with a goal of 30 minutes a day 5 days a week, focus on weight loss management, blood pressure management, and monitoring/being screened for diabetes and hyperlipidemia.  Patient states that she will discuss it further with her PCP.   FINAL MEDICATION LIST END OF ENCOUNTER: No orders of the defined types were placed in this encounter.   Medications Discontinued During This Encounter  Medication Reason   acetaminophen (TYLENOL) 325 MG tablet Error   oxyCODONE (ROXICODONE) 5 MG immediate release tablet Error   ibuprofen (ADVIL) 200 MG tablet  Error   fexofenadine (QC FEXOFENADINE HYDROCHLORIDE) 180 MG tablet Patient Preference     Current Outpatient Medications:    FLUoxetine (PROZAC) 20 MG capsule, Take 20 mg by mouth daily., Disp: , Rfl:    hydrOXYzine (ATARAX/VISTARIL) 25 MG tablet, SMARTSIG:0.5-2 Tablet(s) By Mouth Twice Daily PRN, Disp: , Rfl:    melatonin 3 MG TABS tablet, Take 3 mg by mouth at bedtime., Disp: , Rfl:    Multiple Vitamins-Minerals (HAIR/SKIN/NAILS) CAPS, Take 5,000 Units by mouth 3 (three) times daily., Disp: , Rfl:    norethindrone (MICRONOR) 0.35 MG tablet, Take 1 tablet by mouth daily., Disp: , Rfl:    topiramate (TOPAMAX) 25 MG tablet, Take 25 mg by mouth at bedtime., Disp: , Rfl:   Orders Placed This Encounter  Procedures   LONG TERM MONITOR (3-14 DAYS)   EKG 12-Lead   PCV ECHOCARDIOGRAM COMPLETE    There are no Patient Instructions on file for this visit.   --Continue cardiac medications as reconciled in final medication list. --Return in about 6 weeks (around 09/26/2020) for Follow up, Palpitations, Review test results. Or sooner if needed. --Continue follow-up with your primary care physician regarding the management of your other chronic comorbid conditions.  Patient's questions and concerns were addressed to her satisfaction. She voices understanding of the instructions provided during this encounter.   This note was created using a voice recognition software as a result there may be grammatical errors inadvertently enclosed that do not reflect the nature of this encounter. Every attempt is made to correct such errors.  Rex Kras, [place], [facility]  Pager: [phone] Office: [phone]

## 2020-08-15 ENCOUNTER — Encounter: Payer: Self-pay | Admitting: Cardiology

## 2020-08-15 ENCOUNTER — Other Ambulatory Visit: Payer: Self-pay

## 2020-08-15 ENCOUNTER — Ambulatory Visit: Payer: 59 | Admitting: Cardiology

## 2020-08-15 VITALS — BP 140/90 | HR 95 | Temp 98.1°F | Ht 64.0 in | Wt 330.0 lb

## 2020-08-15 DIAGNOSIS — F419 Anxiety disorder, unspecified: Secondary | ICD-10-CM

## 2020-08-15 DIAGNOSIS — R002 Palpitations: Secondary | ICD-10-CM

## 2020-08-15 DIAGNOSIS — F32A Depression, unspecified: Secondary | ICD-10-CM

## 2020-08-15 DIAGNOSIS — G43909 Migraine, unspecified, not intractable, without status migrainosus: Secondary | ICD-10-CM

## 2020-09-05 ENCOUNTER — Inpatient Hospital Stay: Payer: 59

## 2020-09-05 ENCOUNTER — Other Ambulatory Visit: Payer: 59

## 2020-09-05 DIAGNOSIS — R002 Palpitations: Secondary | ICD-10-CM

## 2020-09-26 ENCOUNTER — Ambulatory Visit: Payer: 59 | Admitting: Cardiology

## 2021-11-06 DIAGNOSIS — R69 Illness, unspecified: Secondary | ICD-10-CM | POA: Diagnosis not present

## 2021-11-06 DIAGNOSIS — G4700 Insomnia, unspecified: Secondary | ICD-10-CM | POA: Diagnosis not present

## 2021-11-10 DIAGNOSIS — R69 Illness, unspecified: Secondary | ICD-10-CM | POA: Diagnosis not present

## 2021-11-10 DIAGNOSIS — G4700 Insomnia, unspecified: Secondary | ICD-10-CM | POA: Diagnosis not present

## 2021-11-18 DIAGNOSIS — R69 Illness, unspecified: Secondary | ICD-10-CM | POA: Diagnosis not present

## 2021-11-18 DIAGNOSIS — G4700 Insomnia, unspecified: Secondary | ICD-10-CM | POA: Diagnosis not present

## 2021-12-23 DIAGNOSIS — F331 Major depressive disorder, recurrent, moderate: Secondary | ICD-10-CM | POA: Diagnosis not present

## 2021-12-23 DIAGNOSIS — G4700 Insomnia, unspecified: Secondary | ICD-10-CM | POA: Diagnosis not present

## 2022-01-04 DIAGNOSIS — J00 Acute nasopharyngitis [common cold]: Secondary | ICD-10-CM | POA: Diagnosis not present

## 2022-01-04 DIAGNOSIS — Z681 Body mass index (BMI) 19 or less, adult: Secondary | ICD-10-CM | POA: Diagnosis not present

## 2022-01-21 DIAGNOSIS — G4700 Insomnia, unspecified: Secondary | ICD-10-CM | POA: Diagnosis not present

## 2022-01-21 DIAGNOSIS — J019 Acute sinusitis, unspecified: Secondary | ICD-10-CM | POA: Diagnosis not present

## 2022-01-21 DIAGNOSIS — Z681 Body mass index (BMI) 19 or less, adult: Secondary | ICD-10-CM | POA: Diagnosis not present

## 2022-01-21 DIAGNOSIS — F331 Major depressive disorder, recurrent, moderate: Secondary | ICD-10-CM | POA: Diagnosis not present

## 2022-01-21 DIAGNOSIS — H9209 Otalgia, unspecified ear: Secondary | ICD-10-CM | POA: Diagnosis not present

## 2022-02-16 ENCOUNTER — Telehealth: Payer: Self-pay

## 2022-02-16 NOTE — Telephone Encounter (Signed)
Mychart msg sent. AS, CMA 

## 2022-04-15 DIAGNOSIS — F320 Major depressive disorder, single episode, mild: Secondary | ICD-10-CM | POA: Diagnosis not present

## 2022-05-11 DIAGNOSIS — F909 Attention-deficit hyperactivity disorder, unspecified type: Secondary | ICD-10-CM | POA: Insufficient documentation

## 2022-05-14 DIAGNOSIS — N920 Excessive and frequent menstruation with regular cycle: Secondary | ICD-10-CM | POA: Diagnosis not present

## 2022-05-18 DIAGNOSIS — F320 Major depressive disorder, single episode, mild: Secondary | ICD-10-CM | POA: Diagnosis not present

## 2022-06-11 DIAGNOSIS — Z124 Encounter for screening for malignant neoplasm of cervix: Secondary | ICD-10-CM | POA: Diagnosis not present

## 2022-06-11 DIAGNOSIS — Z113 Encounter for screening for infections with a predominantly sexual mode of transmission: Secondary | ICD-10-CM | POA: Diagnosis not present

## 2022-06-11 DIAGNOSIS — N920 Excessive and frequent menstruation with regular cycle: Secondary | ICD-10-CM | POA: Diagnosis not present

## 2022-06-22 DIAGNOSIS — F901 Attention-deficit hyperactivity disorder, predominantly hyperactive type: Secondary | ICD-10-CM | POA: Diagnosis not present

## 2022-06-22 DIAGNOSIS — F320 Major depressive disorder, single episode, mild: Secondary | ICD-10-CM | POA: Diagnosis not present

## 2022-06-22 DIAGNOSIS — F4312 Post-traumatic stress disorder, chronic: Secondary | ICD-10-CM | POA: Diagnosis not present

## 2022-06-22 DIAGNOSIS — F411 Generalized anxiety disorder: Secondary | ICD-10-CM | POA: Diagnosis not present
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
## 2022-07-08 DIAGNOSIS — L700 Acne vulgaris: Secondary | ICD-10-CM | POA: Diagnosis not present

## 2022-07-08 DIAGNOSIS — L718 Other rosacea: Secondary | ICD-10-CM | POA: Diagnosis not present

## 2022-07-08 DIAGNOSIS — L02222 Furuncle of back [any part, except buttock]: Secondary | ICD-10-CM | POA: Diagnosis not present

## 2022-07-08 DIAGNOSIS — L814 Other melanin hyperpigmentation: Secondary | ICD-10-CM | POA: Diagnosis not present

## 2022-07-08 DIAGNOSIS — D225 Melanocytic nevi of trunk: Secondary | ICD-10-CM | POA: Diagnosis not present

## 2022-07-10 IMAGING — RF DG C-ARM 1-60 MIN
1 series · 3 of 3 positions shown · non-contrast
Comparison: October 28, 2019.

CLINICAL DATA: Open reduction internal fixation of fifth metacarpal
fracture.

EXAM:
RIGHT HAND - COMPLETE 3+ VIEW; DG C-ARM 1-60 MIN
Radiation exposure index: 0.34 mGy.

[Series 1: run · 3 of 3 slices shown]
[im 1/3]
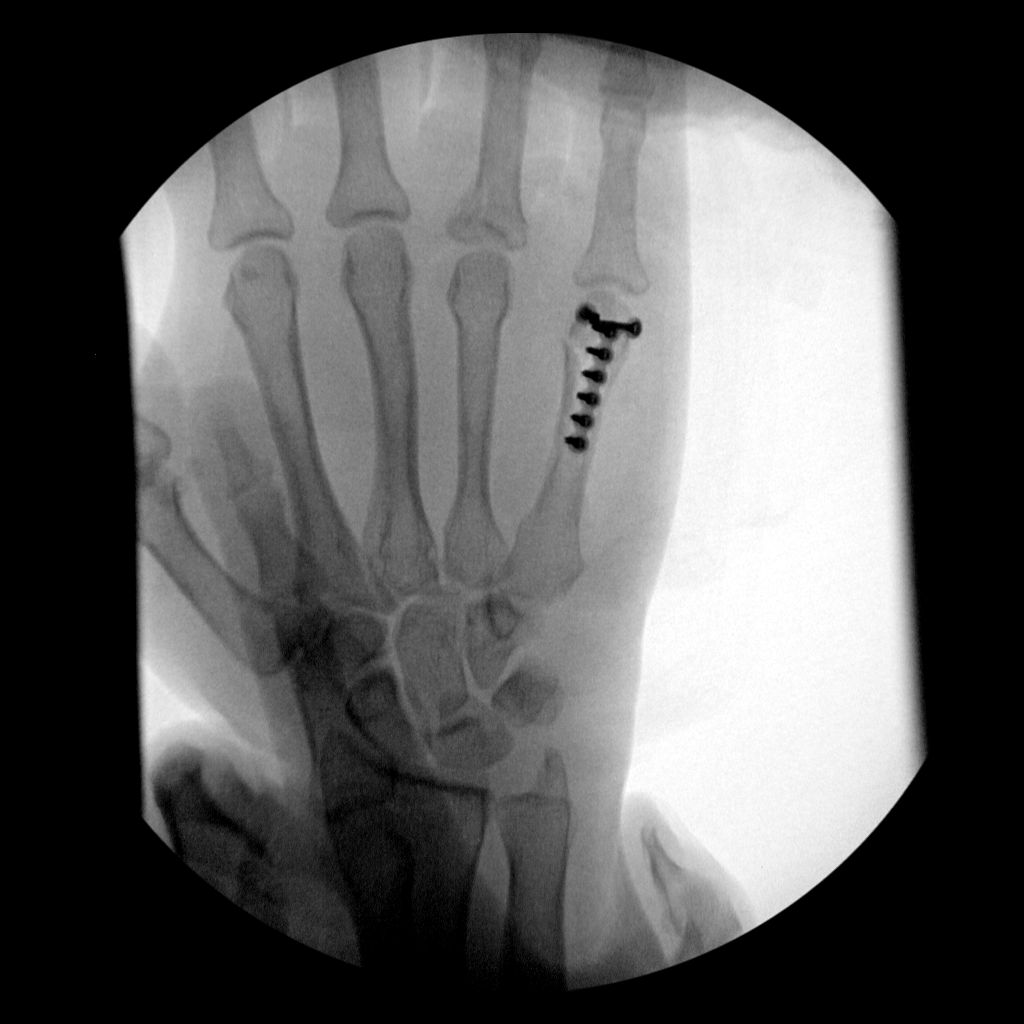
[im 2/3]
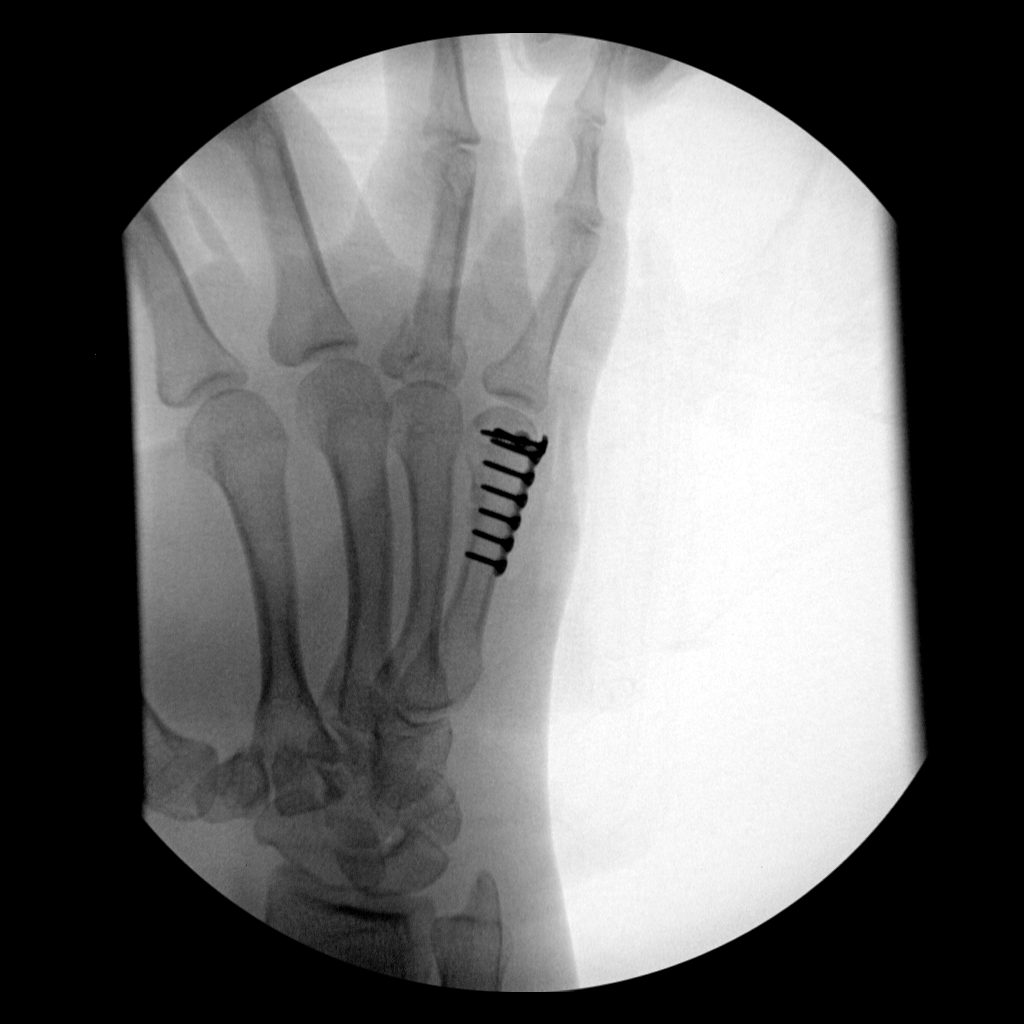
[im 3/3]
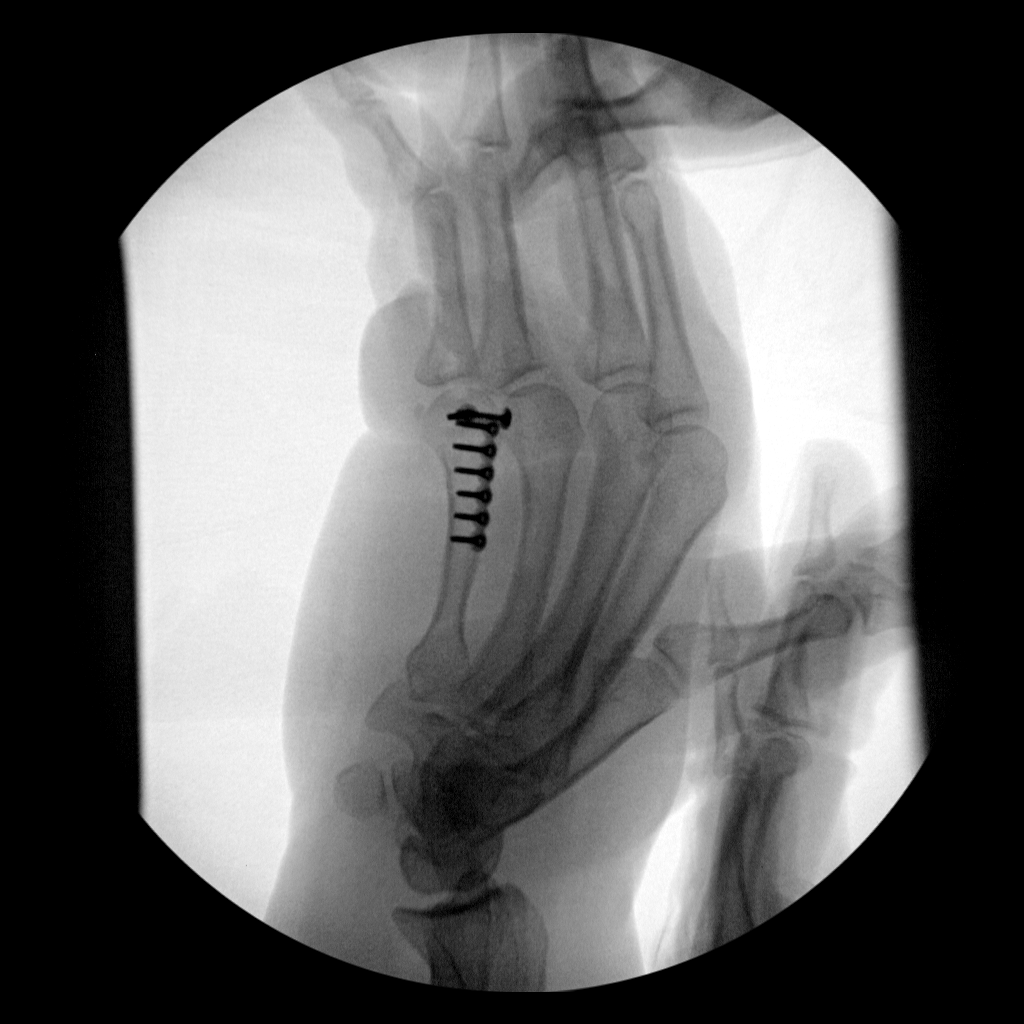

[3 of 3 positions shown; findings below may reference images not displayed]

FINDINGS: Three intraoperative fluoroscopic images were obtained of the right
hand. These images demonstrate surgical internal fixation of the
distal fifth metacarpal. Good alignment of fracture components is
noted.
IMPRESSION: Status post surgical internal fixation of distal fifth metacarpal
fracture.

## 2022-07-20 DIAGNOSIS — F411 Generalized anxiety disorder: Secondary | ICD-10-CM | POA: Diagnosis not present

## 2022-07-20 DIAGNOSIS — F901 Attention-deficit hyperactivity disorder, predominantly hyperactive type: Secondary | ICD-10-CM | POA: Diagnosis not present

## 2022-07-20 DIAGNOSIS — F320 Major depressive disorder, single episode, mild: Secondary | ICD-10-CM | POA: Diagnosis not present

## 2022-07-20 DIAGNOSIS — F4312 Post-traumatic stress disorder, chronic: Secondary | ICD-10-CM | POA: Diagnosis not present

## 2022-08-06 DIAGNOSIS — B3731 Acute candidiasis of vulva and vagina: Secondary | ICD-10-CM | POA: Diagnosis not present

## 2022-08-06 DIAGNOSIS — Z309 Encounter for contraceptive management, unspecified: Secondary | ICD-10-CM | POA: Diagnosis not present

## 2022-08-06 DIAGNOSIS — Z3044 Encounter for surveillance of vaginal ring hormonal contraceptive device: Secondary | ICD-10-CM | POA: Diagnosis not present

## 2022-08-10 DIAGNOSIS — L718 Other rosacea: Secondary | ICD-10-CM | POA: Diagnosis not present

## 2022-08-12 DIAGNOSIS — F320 Major depressive disorder, single episode, mild: Secondary | ICD-10-CM | POA: Diagnosis not present

## 2022-08-12 DIAGNOSIS — F4312 Post-traumatic stress disorder, chronic: Secondary | ICD-10-CM | POA: Diagnosis not present

## 2022-08-12 DIAGNOSIS — F901 Attention-deficit hyperactivity disorder, predominantly hyperactive type: Secondary | ICD-10-CM | POA: Diagnosis not present

## 2022-08-12 DIAGNOSIS — F411 Generalized anxiety disorder: Secondary | ICD-10-CM | POA: Diagnosis not present

## 2022-09-07 DIAGNOSIS — M549 Dorsalgia, unspecified: Secondary | ICD-10-CM | POA: Diagnosis not present

## 2022-09-12 DIAGNOSIS — R300 Dysuria: Secondary | ICD-10-CM | POA: Diagnosis not present

## 2022-09-12 DIAGNOSIS — Z6841 Body Mass Index (BMI) 40.0 and over, adult: Secondary | ICD-10-CM | POA: Diagnosis not present

## 2022-09-12 DIAGNOSIS — R350 Frequency of micturition: Secondary | ICD-10-CM | POA: Diagnosis not present

## 2022-09-12 DIAGNOSIS — N898 Other specified noninflammatory disorders of vagina: Secondary | ICD-10-CM | POA: Diagnosis not present

## 2022-09-12 DIAGNOSIS — B3731 Acute candidiasis of vulva and vagina: Secondary | ICD-10-CM | POA: Diagnosis not present

## 2022-09-14 DIAGNOSIS — F901 Attention-deficit hyperactivity disorder, predominantly hyperactive type: Secondary | ICD-10-CM | POA: Diagnosis not present

## 2022-09-14 DIAGNOSIS — F411 Generalized anxiety disorder: Secondary | ICD-10-CM | POA: Diagnosis not present

## 2022-09-14 DIAGNOSIS — F320 Major depressive disorder, single episode, mild: Secondary | ICD-10-CM | POA: Diagnosis not present

## 2022-09-14 DIAGNOSIS — F4312 Post-traumatic stress disorder, chronic: Secondary | ICD-10-CM | POA: Diagnosis not present

## 2022-10-19 DIAGNOSIS — F411 Generalized anxiety disorder: Secondary | ICD-10-CM | POA: Diagnosis not present

## 2022-10-19 DIAGNOSIS — F320 Major depressive disorder, single episode, mild: Secondary | ICD-10-CM | POA: Diagnosis not present

## 2022-10-19 DIAGNOSIS — F901 Attention-deficit hyperactivity disorder, predominantly hyperactive type: Secondary | ICD-10-CM | POA: Diagnosis not present

## 2022-10-19 DIAGNOSIS — F4312 Post-traumatic stress disorder, chronic: Secondary | ICD-10-CM | POA: Diagnosis not present

## 2022-10-28 DIAGNOSIS — L718 Other rosacea: Secondary | ICD-10-CM | POA: Diagnosis not present

## 2022-11-05 DIAGNOSIS — F413 Other mixed anxiety disorders: Secondary | ICD-10-CM | POA: Diagnosis not present

## 2022-11-10 ENCOUNTER — Encounter: Payer: Self-pay | Admitting: Family Medicine

## 2022-11-10 ENCOUNTER — Ambulatory Visit (INDEPENDENT_AMBULATORY_CARE_PROVIDER_SITE_OTHER): Payer: 59 | Admitting: Family Medicine

## 2022-11-10 ENCOUNTER — Ambulatory Visit: Payer: 59 | Admitting: Family Medicine

## 2022-11-10 VITALS — BP 124/84 | HR 90 | Temp 97.9°F | Ht 64.0 in | Wt 323.0 lb

## 2022-11-10 DIAGNOSIS — R5383 Other fatigue: Secondary | ICD-10-CM

## 2022-11-10 DIAGNOSIS — L282 Other prurigo: Secondary | ICD-10-CM | POA: Diagnosis not present

## 2022-11-10 DIAGNOSIS — F909 Attention-deficit hyperactivity disorder, unspecified type: Secondary | ICD-10-CM

## 2022-11-10 DIAGNOSIS — R197 Diarrhea, unspecified: Secondary | ICD-10-CM | POA: Diagnosis not present

## 2022-11-10 DIAGNOSIS — E559 Vitamin D deficiency, unspecified: Secondary | ICD-10-CM

## 2022-11-10 DIAGNOSIS — F419 Anxiety disorder, unspecified: Secondary | ICD-10-CM

## 2022-11-10 DIAGNOSIS — L659 Nonscarring hair loss, unspecified: Secondary | ICD-10-CM | POA: Diagnosis not present

## 2022-11-10 DIAGNOSIS — L719 Rosacea, unspecified: Secondary | ICD-10-CM | POA: Diagnosis not present

## 2022-11-10 DIAGNOSIS — F32A Depression, unspecified: Secondary | ICD-10-CM

## 2022-11-10 LAB — COMPREHENSIVE METABOLIC PANEL
ALT: 15 U/L (ref 0–35)
AST: 15 U/L (ref 0–37)
Albumin: 4.1 g/dL (ref 3.5–5.2)
Alkaline Phosphatase: 77 U/L (ref 39–117)
BUN: 6 mg/dL (ref 6–23)
CO2: 27 meq/L (ref 19–32)
Calcium: 9.4 mg/dL (ref 8.4–10.5)
Chloride: 102 meq/L (ref 96–112)
Creatinine, Ser: 0.64 mg/dL (ref 0.40–1.20)
GFR: 118.34 mL/min (ref >60.00)
Glucose, Bld: 96 mg/dL (ref 70–99)
Potassium: 3.7 meq/L (ref 3.5–5.1)
Sodium: 139 meq/L (ref 135–145)
Total Bilirubin: 0.5 mg/dL (ref 0.2–1.2)
Total Protein: 7.8 g/dL (ref 6.0–8.3)

## 2022-11-10 LAB — CBC WITH DIFFERENTIAL/PLATELET
Basophils Absolute: 0.1 10*3/uL (ref 0.0–0.1)
Basophils Relative: 0.4 % (ref 0.0–3.0)
Eosinophils Absolute: 0 10*3/uL (ref 0.0–0.7)
Eosinophils Relative: 0.4 % (ref 0.0–5.0)
HCT: 38.2 % (ref 36.0–46.0)
Hemoglobin: 11.6 g/dL — ABNORMAL LOW (ref 12.0–15.0)
Lymphocytes Relative: 23.6 % (ref 12.0–46.0)
Lymphs Abs: 2.7 10*3/uL (ref 0.7–4.0)
MCHC: 30.4 g/dL (ref 30.0–36.0)
MCV: 75.9 fL — ABNORMAL LOW (ref 78.0–100.0)
Monocytes Absolute: 0.5 10*3/uL (ref 0.1–1.0)
Monocytes Relative: 4.9 % (ref 3.0–12.0)
Neutro Abs: 7.9 10*3/uL — ABNORMAL HIGH (ref 1.4–7.7)
Neutrophils Relative %: 70.7 % (ref 43.0–77.0)
Platelets: 358 10*3/uL (ref 150.0–400.0)
RBC: 5.03 Mil/uL (ref 3.87–5.11)
RDW: 16.2 % — ABNORMAL HIGH (ref 11.5–15.5)
WBC: 11.2 10*3/uL — ABNORMAL HIGH (ref 4.0–10.5)

## 2022-11-10 LAB — VITAMIN D 25 HYDROXY (VIT D DEFICIENCY, FRACTURES): VITD: 7.12 ng/mL — ABNORMAL LOW (ref 30.00–100.00)

## 2022-11-10 LAB — T4, FREE: Free T4: 0.87 ng/dL (ref 0.60–1.60)

## 2022-11-10 LAB — TSH: TSH: 1.47 u[IU]/mL (ref 0.35–5.50)

## 2022-11-10 LAB — HEMOGLOBIN A1C: Hgb A1c MFr Bld: 5.7 % (ref 4.6–6.5)

## 2022-11-10 NOTE — Patient Instructions (Signed)
Please go downstairs for labs.   I will be in touch with your results.

## 2022-11-10 NOTE — Progress Notes (Signed)
New Patient Office Visit  Subjective    Patient ID: Falicity Sheets, female    DOB: 08-Mar-1990  Age: 32 y.o. MRN: [id number]  CC:  Chief Complaint  Patient presents with   Establish Care    Medication management, having bad allergic reactions Discuss hair loss    HPI Amanda Wilson presents to establish care Previous PCP - none in a few years.   OB/GYN- Dr. Claiborne Billings.  Dermatologist- [place]-Dr. Billee Cashing   [facility]- Bertis Ruddy, NP through [facility]   C/o allergic reactions, hives on her face x 1 wk Burning and itching on her scalp.   New cream on face and used it for 4 days. She stopped it once rash appeared.  Metronidazole and ivermectin.   Hx of seasonal allergies. Allergy to latex.   Rosacea   Feels like her hair has been thinning x 1 year, worse over the past 2 months.   Normal thyroid function at OB/GYN   Anxiety and depression. Mood has improved.   Anxiety has improved since being on Adderall   ADHD- diagnosed in March or April 2024.   Works as a Paramedic, clinical outpatient.  Single. No kids.         11/10/2022    3:03 PM 07/25/2017   10:35 AM 10/28/2016    8:21 AM 09/28/2016    8:42 AM 08/31/2016    1:35 PM  Depression screen PHQ 2/9  Decreased Interest 0 0 0 1 1  Down, Depressed, Hopeless 0 0 0 1 2  PHQ - 2 Score 0 0 0 2 3  Altered sleeping  2 3 2 2   Tired, decreased energy  2 3 2 1   Change in appetite  1 0 1 0  Feeling bad or failure about yourself   0 1 0 2  Trouble concentrating  0 0 1 0  Moving slowly or fidgety/restless  0 0 1 0  Suicidal thoughts  0 0 1 2  PHQ-9 Score  5 7 10 10   Difficult doing work/chores  Not difficult at all Not difficult at all Somewhat difficult Very difficult       Outpatient Encounter Medications as of 11/10/2022  Medication Sig   ADDERALL 20 MG tablet    amphetamine-dextroamphetamine (ADDERALL) 30 MG tablet Take 1 tablet by mouth every morning.   hydrOXYzine (ATARAX/VISTARIL) 25 MG tablet  SMARTSIG:0.5-2 Tablet(s) By Mouth Twice Daily PRN   melatonin 3 MG TABS tablet Take 3 mg by mouth at bedtime.   amphetamine-dextroamphetamine (ADDERALL XR) 30 MG 24 hr capsule  (Patient not taking: Reported on 11/10/2022)   etonogestrel-ethinyl estradiol (NUVARING) 0.12-0.015 MG/24HR vaginal ring SMARTSIG:1 Ring Vaginal Once a Month (Patient not taking: Reported on 11/10/2022)   [DISCONTINUED] FLUoxetine (PROZAC) 20 MG capsule Take 20 mg by mouth daily.   [DISCONTINUED] Multiple Vitamins-Minerals (HAIR/SKIN/NAILS) CAPS Take 5,000 Units by mouth 3 (three) times daily.   [DISCONTINUED] norethindrone (MICRONOR) 0.35 MG tablet Take 1 tablet by mouth daily.   [DISCONTINUED] topiramate (TOPAMAX) 25 MG tablet Take 25 mg by mouth at bedtime.   No facility-administered encounter medications on file as of 11/10/2022.    Past Medical History:  Diagnosis Date   Allergy    Anxiety    Complication of anesthesia    said nerve block didn't work on finger   COVID-19    early February   Depression    Dyspnea    with exertion   Family history of adverse reaction to anesthesia    Father- slow to  awaken also- father had part of lung remved for an Empyema.   Frequent headaches    migraines- maybe 1 once a month since starting to take Topmax on a daily basis   Neuromuscular disorder (HCC)    nerve pain in right small finger after surgery   Pneumonia    as a  child    Past Surgical History:  Procedure Laterality Date   FRACTURE SURGERY     HARDWARE REMOVAL Right 05/05/2020   Procedure: RIGHT HAND HARDWARE REMOVAL AND SMALL FINGER METACARPOPHALANGEAL JOINT RELEASE;  Surgeon: Mack Hook, MD;  Location: [facility] OR;  Service: Orthopedics;  Laterality: Right;   OPEN REDUCTION INTERNAL FIXATION (ORIF) PROXIMAL PHALANX Right 11/05/2019   Procedure: OPEN TREATMENT OF RIGHT RING FINGER FRACTURE AND 5TH METACARPAL FRACTURE;  Surgeon: Mack Hook, MD;  Location: [facility];  Service: Orthopedics;  Laterality:  Right;  LENGTH OF SURGERY: 90 MIN  PRE OP BLOCK    Family History  Problem Relation Age of Onset   Cancer Mother        skin   Depression Mother    Anxiety disorder Mother    Arthritis Mother    Heart disease Father    Stroke Father    Diabetes Father    Atrial fibrillation Father    Depression Sister    Anxiety disorder Sister    Depression Maternal Aunt    Anxiety disorder Maternal Aunt    Dementia Maternal Grandfather    Atrial fibrillation Paternal Grandfather    Depression Cousin    Anxiety disorder Brother     Social History   Socioeconomic History   Marital status: Single    Spouse name: Not on file   Number of children: Not on file   Years of education: Not on file   Highest education level: Master's degree (e.g., MA, MS, MEng, MEd, MSW, MBA)  Occupational History   Not on file  Tobacco Use   Smoking status: Never   Smokeless tobacco: Never  Vaping Use   Vaping status: Never Used  Substance and Sexual Activity   Alcohol use: Not Currently   Drug use: Never    Types: Marijuana   Sexual activity: Not Currently    Partners: Male    Comment: Nuvaring  Other Topics Concern   Not on file  Social History Narrative   Not on file   Social Determinants of Health   Financial Resource Strain: Low Risk  (11/09/2022)   Overall Financial Resource Strain (CARDIA)    Difficulty of Paying Living Expenses: Not hard at all  Food Insecurity: Food Insecurity Present (11/09/2022)   Hunger Vital Sign    Worried About Running Out of Food in the Last Year: Sometimes true    Ran Out of Food in the Last Year: Never true  Transportation Needs: No Transportation Needs (11/09/2022)   PRAPARE - Administrator, Civil Service (Medical): No    Lack of Transportation (Non-Medical): No  Physical Activity: Unknown (11/09/2022)   Exercise Vital Sign    Days of Exercise per Week: 0 days    Minutes of Exercise per Session: Not on file  Stress: Stress Concern Present  (11/09/2022)   [employer] of Occupational Health - Occupational Stress Questionnaire    Feeling of Stress : To some extent  Social Connections: Socially Isolated (11/09/2022)   Social Connection and Isolation Panel [NHANES]    Frequency of Communication with Friends and Family: More than three times a week  Frequency of Social Gatherings with Friends and Family: Once a week    Attends Religious Services: Never    Database administrator or Organizations: No    Attends Engineer, structural: Not on file    Marital Status: Never married  Intimate Partner Violence: Not At Risk (12/10/2017)   Humiliation, Afraid, Rape, and Kick questionnaire    Fear of Current or Ex-Partner: No    Emotionally Abused: No    Physically Abused: No    Sexually Abused: No    Review of Systems  Constitutional:  Positive for malaise/fatigue. Negative for chills, fever and weight loss.  Respiratory:  Negative for shortness of breath.   Cardiovascular:  Negative for chest pain, palpitations and leg swelling.  Gastrointestinal:  Positive for diarrhea. Negative for abdominal pain, constipation, nausea and vomiting.  Genitourinary:  Negative for dysuria, frequency and urgency.  Skin:  Positive for itching and rash.  Neurological:  Negative for dizziness, focal weakness and headaches.  Psychiatric/Behavioral:  Negative for depression and suicidal ideas. The patient has insomnia. The patient is not nervous/anxious.         Objective    BP 124/84 (BP Location: Left Arm, Patient Position: Sitting, Cuff Size: Large)   Pulse 90   Temp 97.9 F (36.6 C) (Temporal)   Ht 5\' 4"  (1.626 m)   Wt (!) 323 lb (146.5 kg)   SpO2 98%   BMI 55.44 kg/m   Physical Exam Constitutional:      General: She is not in acute distress.    Appearance: She is obese. She is not ill-appearing.  HENT:     Mouth/Throat:     Mouth: Mucous membranes are moist. No oral lesions or angioedema.     Pharynx: Oropharynx is  clear. No pharyngeal swelling or uvula swelling.  Eyes:     Extraocular Movements: Extraocular movements intact.     Conjunctiva/sclera: Conjunctivae normal.  Cardiovascular:     Rate and Rhythm: Normal rate.  Pulmonary:     Effort: Pulmonary effort is normal.  Musculoskeletal:     Cervical back: Normal range of motion and neck supple.  Skin:    General: Skin is warm and dry.     Findings: Rash present.     Comments: Hive noted on face, right cheek and scalp at hair line.   Neurological:     General: No focal deficit present.     Mental Status: She is alert and oriented to person, place, and time.  Psychiatric:        Mood and Affect: Mood normal.        Behavior: Behavior normal.        Thought Content: Thought content normal.         Assessment & Plan:   Problem List Items Addressed This Visit       Other   ADHD   Anxiety and depression   Hair thinning - Primary   Relevant Orders   CBC with Differential/Platelet   Comprehensive metabolic panel   TSH   T4, free   Thyroid antibodies   Zinc   Iron, TIBC and Ferritin Panel   Other Visit Diagnoses     Fatigue, unspecified type       Relevant Orders   CBC with Differential/Platelet   Comprehensive metabolic panel   TSH   T4, free   Hemoglobin A1c   Iron, TIBC and Ferritin Panel   Severe obesity (BMI >= 40) (HCC)  Relevant Medications   amphetamine-dextroamphetamine (ADDERALL XR) 30 MG 24 hr capsule   ADDERALL 20 MG tablet   amphetamine-dextroamphetamine (ADDERALL) 30 MG tablet   Other Relevant Orders   TSH   T4, free   Thyroid antibodies   Hemoglobin A1c   Pruritic rash       Rosacea       Vitamin D deficiency       Relevant Orders   VITAMIN D 25 Hydroxy (Vit-D Deficiency, Fractures)   Diarrhea, unspecified type       Relevant Orders   CBC with Differential/Platelet   Comprehensive metabolic panel   TSH   T4, free   Thyroid antibodies   Zinc       She is a pleasant 32 year old female  who is new to the practice and here to establish care.  Several concerns today including hair thinning over the past few months, fatigue and ongoing diarrhea.  Recently has noticed a decrease in her appetite.  Weight has been a concern of hers and she would like help with weight loss. Recent allergic reaction appears to be related to topical rosacea treatment prescribed by dermatologist.  She will avoid using this again and reach out to the dermatologist. Discussed taking Zyrtec twice daily.  Offered oral steroids and she declines for now. History of vitamin D deficiency and is not currently taking a supplement.  Check vitamin D level. Request records and lab results from her OB/GYN office. Continue seeing mental health specialist.  Doing well on current medications. Follow-up pending lab results.   Return for pending labs.   Hetty Blend, NP-C

## 2022-11-16 ENCOUNTER — Other Ambulatory Visit: Payer: Self-pay | Admitting: Family Medicine

## 2022-11-16 DIAGNOSIS — E559 Vitamin D deficiency, unspecified: Secondary | ICD-10-CM

## 2022-11-16 MED ORDER — VITAMIN D (ERGOCALCIFEROL) 1.25 MG (50000 UNIT) PO CAPS: 50000.0000 [IU] | ORAL_CAPSULE | ORAL | 2 refills | Status: DC

## 2022-11-18 DIAGNOSIS — F411 Generalized anxiety disorder: Secondary | ICD-10-CM | POA: Diagnosis not present

## 2022-11-18 DIAGNOSIS — F901 Attention-deficit hyperactivity disorder, predominantly hyperactive type: Secondary | ICD-10-CM | POA: Diagnosis not present

## 2022-11-18 DIAGNOSIS — F4312 Post-traumatic stress disorder, chronic: Secondary | ICD-10-CM | POA: Diagnosis not present

## 2022-11-18 DIAGNOSIS — F320 Major depressive disorder, single episode, mild: Secondary | ICD-10-CM | POA: Diagnosis not present

## 2022-11-20 LAB — IRON,TIBC AND FERRITIN PANEL
%SAT: 5 % — ABNORMAL LOW (ref 16–45)
Ferritin: 6 ng/mL — ABNORMAL LOW (ref 16–154)
Iron: 20 ug/dL — ABNORMAL LOW (ref 40–190)
TIBC: 382 ug/dL (ref 250–450)

## 2022-11-20 LAB — THYROID ANTIBODIES
Thyroglobulin Ab: <1 [IU]/mL (ref <=1)
Thyroperoxidase Ab SerPl-aCnc: <1 [IU]/mL (ref <9)

## 2022-11-20 LAB — ZINC: Zinc: 73 ug/dL (ref 60–130)

## 2022-11-21 NOTE — Progress Notes (Signed)
Please see my note and ask her to follow up in office with me in 6-8 weeks after being on a daily iron supplement. Also, is she taking the vitamin D supplement I prescribed?

## 2022-11-23 DIAGNOSIS — F413 Other mixed anxiety disorders: Secondary | ICD-10-CM | POA: Diagnosis not present

## 2022-12-03 DIAGNOSIS — F413 Other mixed anxiety disorders: Secondary | ICD-10-CM | POA: Diagnosis not present

## 2022-12-13 ENCOUNTER — Ambulatory Visit (INDEPENDENT_AMBULATORY_CARE_PROVIDER_SITE_OTHER): Payer: 59 | Admitting: Family Medicine

## 2022-12-13 ENCOUNTER — Encounter: Payer: Self-pay | Admitting: Family Medicine

## 2022-12-13 VITALS — BP 118/82 | HR 92 | Temp 97.6°F | Ht 64.0 in | Wt 325.0 lb

## 2022-12-13 DIAGNOSIS — N920 Excessive and frequent menstruation with regular cycle: Secondary | ICD-10-CM | POA: Insufficient documentation

## 2022-12-13 DIAGNOSIS — D500 Iron deficiency anemia secondary to blood loss (chronic): Secondary | ICD-10-CM | POA: Diagnosis not present

## 2022-12-13 DIAGNOSIS — L659 Nonscarring hair loss, unspecified: Secondary | ICD-10-CM | POA: Diagnosis not present

## 2022-12-13 DIAGNOSIS — D509 Iron deficiency anemia, unspecified: Secondary | ICD-10-CM | POA: Insufficient documentation

## 2022-12-13 DIAGNOSIS — R5383 Other fatigue: Secondary | ICD-10-CM

## 2022-12-13 DIAGNOSIS — E559 Vitamin D deficiency, unspecified: Secondary | ICD-10-CM | POA: Diagnosis not present

## 2022-12-13 NOTE — Progress Notes (Signed)
Subjective:     Patient ID: Amanda Wilson, female    DOB: Nov 03, 1990, 32 y.o.   MRN: [id number]  Chief Complaint  Patient presents with   Medical Management of Chronic Issues    F/u after being on iron    HPI   History of Present Illness         Here to follow up on IDA and vitamin D def.   C/o very heavy periods with clotting.   She is taking oral iron OTC daily and prescription vitamin D weekly.   Hair thinning has slowed and she sees new growth. No change in level of fatigue.   Obesity- she has done [employer] in the past. She lost 30 lbs and regained the weight.  She has exercised a lot, more so in college, and dieting but did not lose weight.   She is not currently her heaviest, down 25 lbs from her highest weight.     Health Maintenance Due  Topic Date Due   HIV Screening  Never done   Hepatitis C Screening  Never done   DTaP/Tdap/Td (7 - Td or Tdap) 08/24/2020   Cervical Cancer Screening (HPV/Pap Cotest)  Never done    Past Medical History:  Diagnosis Date   Allergy    Anxiety    Complication of anesthesia    said nerve block didn't work on finger   COVID-19    early February   Depression    Dyspnea    with exertion   Family history of adverse reaction to anesthesia    Father- slow to awaken also- father had part of lung remved for an Empyema.   Frequent headaches    migraines- maybe 1 once a month since starting to take Topmax on a daily basis   Neuromuscular disorder (HCC)    nerve pain in right small finger after surgery   Pneumonia    as a  child    Past Surgical History:  Procedure Laterality Date   FRACTURE SURGERY     HARDWARE REMOVAL Right 05/05/2020   Procedure: RIGHT HAND HARDWARE REMOVAL AND SMALL FINGER METACARPOPHALANGEAL JOINT RELEASE;  Surgeon: Mack Hook, MD;  Location: [facility] OR;  Service: Orthopedics;  Laterality: Right;   OPEN REDUCTION INTERNAL FIXATION (ORIF) PROXIMAL PHALANX Right 11/05/2019   Procedure: OPEN TREATMENT OF  RIGHT RING FINGER FRACTURE AND 5TH METACARPAL FRACTURE;  Surgeon: Mack Hook, MD;  Location: [facility] OR;  Service: Orthopedics;  Laterality: Right;  LENGTH OF SURGERY: 90 MIN  PRE OP BLOCK    Family History  Problem Relation Age of Onset   Cancer Mother        skin   Depression Mother    Anxiety disorder Mother    Arthritis Mother    Heart disease Father    Stroke Father    Diabetes Father    Atrial fibrillation Father    Depression Sister    Anxiety disorder Sister    Depression Maternal Aunt    Anxiety disorder Maternal Aunt    Dementia Maternal Grandfather    Atrial fibrillation Paternal Grandfather    Depression Cousin    Anxiety disorder Brother     Social History   Socioeconomic History   Marital status: Single    Spouse name: Not on file   Number of children: Not on file   Years of education: Not on file   Highest education level: Master's degree (e.g., MA, MS, MEng, MEd, MSW, MBA)  Occupational History   Not  on file  Tobacco Use   Smoking status: Never   Smokeless tobacco: Never  Vaping Use   Vaping status: Never Used  Substance and Sexual Activity   Alcohol use: Not Currently   Drug use: Never    Types: Marijuana   Sexual activity: Not Currently    Partners: Male    Comment: Nuvaring  Other Topics Concern   Not on file  Social History Narrative   Not on file   Social Determinants of Health   Financial Resource Strain: Low Risk  (11/09/2022)   Overall Financial Resource Strain (CARDIA)    Difficulty of Paying Living Expenses: Not hard at all  Food Insecurity: Food Insecurity Present (11/09/2022)   Hunger Vital Sign    Worried About Running Out of Food in the Last Year: Sometimes true    Ran Out of Food in the Last Year: Never true  Transportation Needs: No Transportation Needs (11/09/2022)   PRAPARE - Administrator, Civil Service (Medical): No    Lack of Transportation (Non-Medical): No  Physical Activity: Unknown (11/09/2022)    Exercise Vital Sign    Days of Exercise per Week: 0 days    Minutes of Exercise per Session: Not on file  Stress: Stress Concern Present (11/09/2022)   [employer] of Occupational Health - Occupational Stress Questionnaire    Feeling of Stress : To some extent  Social Connections: Socially Isolated (11/09/2022)   Social Connection and Isolation Panel [NHANES]    Frequency of Communication with Friends and Family: More than three times a week    Frequency of Social Gatherings with Friends and Family: Once a week    Attends Religious Services: Never    Database administrator or Organizations: No    Attends Engineer, structural: Not on file    Marital Status: Never married  Intimate Partner Violence: Not At Risk (12/10/2017)   Humiliation, Afraid, Rape, and Kick questionnaire    Fear of Current or Ex-Partner: No    Emotionally Abused: No    Physically Abused: No    Sexually Abused: No    Outpatient Medications Prior to Visit  Medication Sig Dispense Refill   ADDERALL 20 MG tablet      amphetamine-dextroamphetamine (ADDERALL) 30 MG tablet Take 1 tablet by mouth every morning.     Ferrous Sulfate (IRON PO) Take by mouth.     hydrOXYzine (ATARAX/VISTARIL) 25 MG tablet SMARTSIG:0.5-2 Tablet(s) By Mouth Twice Daily PRN     melatonin 3 MG TABS tablet Take 3 mg by mouth at bedtime.     Vitamin D, Ergocalciferol, (DRISDOL) 1.25 MG (50000 UNIT) CAPS capsule Take 1 capsule (50,000 Units total) by mouth every 7 (seven) days. 4 capsule 2   amphetamine-dextroamphetamine (ADDERALL XR) 30 MG 24 hr capsule  (Patient not taking: Reported on 11/10/2022)     etonogestrel-ethinyl estradiol (NUVARING) 0.12-0.015 MG/24HR vaginal ring SMARTSIG:1 Ring Vaginal Once a Month (Patient not taking: Reported on 11/10/2022)     No facility-administered medications prior to visit.    Allergies  Allergen Reactions   Duricef [Cefadroxil]     Skin sloughing   Latex Rash    Review of Systems   Constitutional:  Positive for malaise/fatigue. Negative for chills and fever.  HENT:  Negative for congestion and sore throat.   Respiratory:  Negative for cough and shortness of breath.   Cardiovascular:  Negative for chest pain, palpitations and leg swelling.  Gastrointestinal:  Negative for abdominal pain, constipation, diarrhea,  nausea and vomiting.  Genitourinary:  Negative for dysuria, frequency and urgency.  Neurological:  Negative for dizziness and focal weakness.       Objective:    Physical Exam Constitutional:      General: She is not in acute distress.    Appearance: She is not ill-appearing.  Eyes:     Extraocular Movements: Extraocular movements intact.     Conjunctiva/sclera: Conjunctivae normal.  Cardiovascular:     Rate and Rhythm: Normal rate.  Pulmonary:     Effort: Pulmonary effort is normal.  Musculoskeletal:     Cervical back: Normal range of motion and neck supple.  Skin:    General: Skin is warm and dry.  Neurological:     General: No focal deficit present.     Mental Status: She is alert and oriented to person, place, and time.  Psychiatric:        Mood and Affect: Mood normal.        Behavior: Behavior normal.        Thought Content: Thought content normal.      BP 118/82 (BP Location: Left Arm, Patient Position: Sitting, Cuff Size: Large)   Pulse 92   Temp 97.6 F (36.4 C) (Temporal)   Ht 5\' 4"  (1.626 m)   Wt (!) 325 lb (147.4 kg)   SpO2 98%   BMI 55.79 kg/m  Wt Readings from Last 3 Encounters:  12/13/22 (!) 325 lb (147.4 kg)  11/10/22 (!) 323 lb (146.5 kg)  08/15/20 (!) 330 lb (149.7 kg)       Assessment & Plan:   Problem List Items Addressed This Visit     Hair thinning   Iron deficiency anemia due to chronic blood loss - Primary   Relevant Medications   Ferrous Sulfate (IRON PO)   Other Relevant Orders   CBC with Differential/Platelet   Ferritin   Menorrhagia with regular cycle   Severe obesity (BMI >= 40) (HCC)    Vitamin D deficiency   Relevant Orders   VITAMIN D 25 Hydroxy (Vit-D Deficiency, Fractures)   Other Visit Diagnoses     Fatigue, unspecified type          Here to follow-up on IDA apparently related to heavy periods and severe vitamin D deficiency.  She has been taking oral iron and vitamin D. No improvement in fatigue.  She has noticed some new hair growth and less hair loss.  Discussed obesity help with referral to [facility] and/or GLP-1s. She will let me know once she checks with her insurance and has time to consider options.  She will reach out to her OB/GYN regarding heavy menses since she is now anemic.     I am having Jelani Montejano maintain her hydrOXYzine, melatonin, amphetamine-dextroamphetamine, Adderall, etonogestrel-ethinyl estradiol, amphetamine-dextroamphetamine, Vitamin D (Ergocalciferol), and Ferrous Sulfate (IRON PO).  No orders of the defined types were placed in this encounter.

## 2022-12-13 NOTE — Patient Instructions (Addendum)
Please go downstairs for labs.   Let me know if you would like a referral to [facility] or if you would like to try Zepbound or Wegovy for weight loss. These are once weekly injections. Check with your insurance.   I recommend scheduling a visit with your OB/GYN

## 2022-12-16 DIAGNOSIS — F320 Major depressive disorder, single episode, mild: Secondary | ICD-10-CM | POA: Diagnosis not present

## 2022-12-17 ENCOUNTER — Other Ambulatory Visit (INDEPENDENT_AMBULATORY_CARE_PROVIDER_SITE_OTHER): Payer: 59

## 2022-12-17 ENCOUNTER — Other Ambulatory Visit: Payer: Self-pay | Admitting: Family

## 2022-12-17 DIAGNOSIS — E559 Vitamin D deficiency, unspecified: Secondary | ICD-10-CM

## 2022-12-17 DIAGNOSIS — D500 Iron deficiency anemia secondary to blood loss (chronic): Secondary | ICD-10-CM | POA: Diagnosis not present

## 2022-12-17 LAB — CBC WITH DIFFERENTIAL/PLATELET
Basophils Absolute: 0 10*3/uL (ref 0.0–0.1)
Basophils Relative: 0.4 % (ref 0.0–3.0)
Eosinophils Absolute: 0.1 10*3/uL (ref 0.0–0.7)
Eosinophils Relative: 1.2 % (ref 0.0–5.0)
HCT: 35.6 % — ABNORMAL LOW (ref 36.0–46.0)
Hemoglobin: 11.1 g/dL — ABNORMAL LOW (ref 12.0–15.0)
Lymphocytes Relative: 28.4 % (ref 12.0–46.0)
Lymphs Abs: 2.5 10*3/uL (ref 0.7–4.0)
MCHC: 31.3 g/dL (ref 30.0–36.0)
MCV: 75.1 fL — ABNORMAL LOW (ref 78.0–100.0)
Monocytes Absolute: 0.5 10*3/uL (ref 0.1–1.0)
Monocytes Relative: 6.1 % (ref 3.0–12.0)
Neutro Abs: 5.5 10*3/uL (ref 1.4–7.7)
Neutrophils Relative %: 63.9 % (ref 43.0–77.0)
Platelets: 361 10*3/uL (ref 150.0–400.0)
RBC: 4.74 Mil/uL (ref 3.87–5.11)
RDW: 15.4 % (ref 11.5–15.5)
WBC: 8.7 10*3/uL (ref 4.0–10.5)

## 2022-12-17 LAB — VITAMIN D 25 HYDROXY (VIT D DEFICIENCY, FRACTURES): VITD: 10.19 ng/mL — ABNORMAL LOW (ref 30.00–100.00)

## 2022-12-17 LAB — FERRITIN: Ferritin: 5.7 ng/mL — ABNORMAL LOW (ref 10.0–291.0)

## 2022-12-17 MED ORDER — VITAMIN D (ERGOCALCIFEROL) 1.25 MG (50000 UNIT) PO CAPS: 50000.0000 [IU] | ORAL_CAPSULE | ORAL | 0 refills | Status: DC

## 2022-12-18 ENCOUNTER — Emergency Department (HOSPITAL_COMMUNITY)
Admission: EM | Admit: 2022-12-18 | Discharge: 2022-12-18 | Disposition: A | Discharge status: Home / Self Care | Payer: 59 | Attending: Emergency Medicine | Admitting: Emergency Medicine

## 2022-12-18 ENCOUNTER — Emergency Department (HOSPITAL_COMMUNITY): Payer: 59

## 2022-12-18 ENCOUNTER — Encounter (HOSPITAL_COMMUNITY): Payer: Self-pay

## 2022-12-18 ENCOUNTER — Other Ambulatory Visit: Payer: Self-pay

## 2022-12-18 DIAGNOSIS — R1013 Epigastric pain: Secondary | ICD-10-CM | POA: Diagnosis not present

## 2022-12-18 DIAGNOSIS — Z91040 Latex allergy status: Secondary | ICD-10-CM | POA: Insufficient documentation

## 2022-12-18 DIAGNOSIS — R1011 Right upper quadrant pain: Secondary | ICD-10-CM | POA: Insufficient documentation

## 2022-12-18 DIAGNOSIS — Z8616 Personal history of COVID-19: Secondary | ICD-10-CM | POA: Insufficient documentation

## 2022-12-18 DIAGNOSIS — R0602 Shortness of breath: Secondary | ICD-10-CM | POA: Insufficient documentation

## 2022-12-18 DIAGNOSIS — M549 Dorsalgia, unspecified: Secondary | ICD-10-CM | POA: Diagnosis not present

## 2022-12-18 DIAGNOSIS — K8020 Calculus of gallbladder without cholecystitis without obstruction: Secondary | ICD-10-CM | POA: Diagnosis not present

## 2022-12-18 DIAGNOSIS — M546 Pain in thoracic spine: Secondary | ICD-10-CM | POA: Diagnosis not present

## 2022-12-18 DIAGNOSIS — R9431 Abnormal electrocardiogram [ECG] [EKG]: Secondary | ICD-10-CM | POA: Diagnosis not present

## 2022-12-18 DIAGNOSIS — R1110 Vomiting, unspecified: Secondary | ICD-10-CM | POA: Diagnosis not present

## 2022-12-18 LAB — COMPREHENSIVE METABOLIC PANEL
ALT: 19 U/L (ref 0–44)
AST: 17 U/L (ref 15–41)
Albumin: 3.8 g/dL (ref 3.5–5.0)
Alkaline Phosphatase: 74 U/L (ref 38–126)
Anion gap: 8 (ref 5–15)
BUN: 11 mg/dL (ref 6–20)
CO2: 24 mmol/L (ref 22–32)
Calcium: 9 mg/dL (ref 8.9–10.3)
Chloride: 104 mmol/L (ref 98–111)
Creatinine, Ser: 0.75 mg/dL (ref 0.44–1.00)
GFR, Estimated: >60 mL/min (ref >60)
Glucose, Bld: 119 mg/dL — ABNORMAL HIGH (ref 70–99)
Potassium: 3.7 mmol/L (ref 3.5–5.1)
Sodium: 136 mmol/L (ref 135–145)
Total Bilirubin: 0.5 mg/dL (ref <1.2)
Total Protein: 8 g/dL (ref 6.5–8.1)

## 2022-12-18 LAB — CBC WITH DIFFERENTIAL/PLATELET
Abs Immature Granulocytes: 0.05 10*3/uL (ref 0.00–0.07)
Basophils Absolute: 0.1 10*3/uL (ref 0.0–0.1)
Basophils Relative: 0 %
Eosinophils Absolute: 0 10*3/uL (ref 0.0–0.5)
Eosinophils Relative: 0 %
HCT: 36.3 % (ref 36.0–46.0)
Hemoglobin: 11.2 g/dL — ABNORMAL LOW (ref 12.0–15.0)
Immature Granulocytes: 0 %
Lymphocytes Relative: 15 %
Lymphs Abs: 1.9 10*3/uL (ref 0.7–4.0)
MCH: 24.1 pg — ABNORMAL LOW (ref 26.0–34.0)
MCHC: 30.9 g/dL (ref 30.0–36.0)
MCV: 78.1 fL — ABNORMAL LOW (ref 80.0–100.0)
Monocytes Absolute: 0.6 10*3/uL (ref 0.1–1.0)
Monocytes Relative: 5 %
Neutro Abs: 9.7 10*3/uL — ABNORMAL HIGH (ref 1.7–7.7)
Neutrophils Relative %: 80 %
Platelets: 353 10*3/uL (ref 150–400)
RBC: 4.65 MIL/uL (ref 3.87–5.11)
RDW: 14.8 % (ref 11.5–15.5)
WBC: 12.3 10*3/uL — ABNORMAL HIGH (ref 4.0–10.5)
nRBC: 0 % (ref 0.0–0.2)

## 2022-12-18 LAB — URINALYSIS, ROUTINE W REFLEX MICROSCOPIC
Bilirubin Urine: NEGATIVE
Glucose, UA: NEGATIVE mg/dL
Hgb urine dipstick: NEGATIVE
Ketones, ur: NEGATIVE mg/dL
Leukocytes,Ua: NEGATIVE
Nitrite: NEGATIVE
Protein, ur: NEGATIVE mg/dL
Specific Gravity, Urine: 1.023 (ref 1.005–1.030)
pH: 5 (ref 5.0–8.0)

## 2022-12-18 LAB — LIPASE, BLOOD: Lipase: 27 U/L (ref 11–51)

## 2022-12-18 LAB — HCG, SERUM, QUALITATIVE: Preg, Serum: NEGATIVE

## 2022-12-18 MED ORDER — ONDANSETRON HCL 4 MG PO TABS: 4.0000 mg | ORAL_TABLET | Freq: Four times a day (QID) | ORAL | 0 refills | Status: DC

## 2022-12-18 MED ORDER — HYDROMORPHONE HCL 1 MG/ML IJ SOLN
1.0000 mg | Freq: Once | INTRAMUSCULAR | Status: AC
  Administered 2022-12-18: 1 mg via INTRAVENOUS
  Filled 2022-12-18: qty 1

## 2022-12-18 MED ORDER — PROCHLORPERAZINE EDISYLATE 10 MG/2ML IJ SOLN
10.0000 mg | Freq: Once | INTRAMUSCULAR | Status: AC
  Administered 2022-12-18: 10 mg via INTRAVENOUS
  Filled 2022-12-18: qty 2

## 2022-12-18 MED ORDER — OXYCODONE HCL 5 MG PO TABS: 5.0000 mg | ORAL_TABLET | Freq: Four times a day (QID) | ORAL | 0 refills | Status: DC | PRN

## 2022-12-18 MED ORDER — MORPHINE SULFATE (PF) 4 MG/ML IV SOLN
4.0000 mg | Freq: Once | INTRAVENOUS | Status: AC
  Administered 2022-12-18: 4 mg via INTRAVENOUS
  Filled 2022-12-18: qty 1

## 2022-12-18 MED ORDER — OXYCODONE-ACETAMINOPHEN 5-325 MG PO TABS
1.0000 | ORAL_TABLET | Freq: Once | ORAL | Status: AC
  Administered 2022-12-18: 1 via ORAL
  Filled 2022-12-18: qty 1

## 2022-12-18 MED ORDER — KETOROLAC TROMETHAMINE 30 MG/ML IJ SOLN
30.0000 mg | Freq: Once | INTRAMUSCULAR | Status: AC
  Administered 2022-12-18: 30 mg via INTRAVENOUS
  Filled 2022-12-18: qty 1

## 2022-12-18 NOTE — Discharge Instructions (Signed)
You were seen in the emergency department for your right sided abdominal and back pain.  Your workup showed that you have gallstones in your gallbladder but no signs of gallbladder infection at this time.  You can take Tylenol and Motrin every 6 hours as needed for pain and I have given you oxycodone to take for breakthrough pain.  This can make you drowsy so do not take it before driving, working or operating heavy machinery.  I have also given you Zofran to take as needed for nausea.  You can follow-up with general surgery to see about having her gallbladder removed.  He should return to the emergency department if you have significantly worsening pain, repetitive vomiting despite the nausea medicine, fevers or any other new or concerning symptoms.

## 2022-12-18 NOTE — ED Provider Notes (Signed)
Patient signed out to me at 0700 by Dr. Eudelia Bunch pending right upper quadrant ultrasound and reassessment.  In short this is a 32 year old female with a past medical history of obesity presenting to the emergency department with right upper quadrant and right sided scapular pain.  Patient's labs were within normal range.  Ultrasound is pending at this time.  She received Dilaudid and Compazine for pain control with improvement.  Clinical Course as of 12/18/22 1003  Sat Dec 18, 2022  6962 RUQ [place] with cholelithiasis without cholecystitis. [VK]  [id number] On my evaluation, the patient reports significant increase of her pain.  Her abdomen is soft and tender in the right upper quadrant.  She will be given second dose of pain control to evaluate for pain will be well enough controlled for outpatient follow-up. [VK]  1001 Upon reassessment, the patient's pain has significantly improved.  She is stable for discharge home with outpatient follow-up and was given strict return precautions. [VK]    Clinical Course User Index [VK] Rexford Maus, DO      Rexford Maus, [place] 12/18/22 1003

## 2022-12-18 NOTE — ED Provider Notes (Signed)
Interior EMERGENCY DEPARTMENT AT [facility] [place] Provider Note  CSN: [id number] Arrival date & time: 12/18/22 0446  Chief Complaint(s) Back Pain  HPI Chanique Raymer is a 32 y.o. female with a past medical history listed below who presents to the emergency department with right periscapular region pain.  Similar pain to muscle spasms in the past but more severe and longer lasting.  This episode began 6 hours prior to arrival and has persisted.  Patient denies any falls.  States that she was at her desk writing notes at the time.  She denies any recent fevers or infections.  No coughing or congestion.  Endorses shortness of breath only because the pain is exacerbated with it.  Denies any chest pain.  Does endorse upper abdominal pain that followed which was associated with nausea and nonbloody nonbilious emesis x 1.  She denies any suspicious food intake.  Denies any alcohol use.  No prior clots.  The history is provided by the patient.    Past Medical History Past Medical History:  Diagnosis Date   Allergy    Anxiety    Complication of anesthesia    said nerve block didn't work on finger   COVID-19    early February   Depression    Dyspnea    with exertion   Family history of adverse reaction to anesthesia    Father- slow to awaken also- father had part of lung remved for an Empyema.   Frequent headaches    migraines- maybe 1 once a month since starting to take Topmax on a daily basis   Neuromuscular disorder (HCC)    nerve pain in right small finger after surgery   Pneumonia    as a  child   Patient Active Problem List   Diagnosis Date Noted   Iron deficiency anemia due to chronic blood loss 12/13/2022   Vitamin D deficiency 12/13/2022   Severe obesity (BMI >= 40) (HCC) 12/13/2022   Menorrhagia with regular cycle 12/13/2022   ADHD 05/11/2022   Dysmenorrhea 06/24/2016   Anxiety and depression 06/24/2016   Hirsutism 07/17/2013   Obesity 02/06/2013   Family  history of PCOS 01/30/2013   Hair thinning 01/30/2013   Acne 05/12/2012   Home Medication(s) Prior to Admission medications   Medication Sig Start Date End Date Taking? Authorizing Provider  Vitamin D, Ergocalciferol, (DRISDOL) 1.25 MG (50000 UNIT) CAPS capsule Take 1 capsule (50,000 Units total) by mouth every 7 (seven) days. 12/17/22   Eulis Foster, FNP  ADDERALL 20 MG tablet  07/20/22   [provider]  amphetamine-dextroamphetamine (ADDERALL XR) 30 MG 24 hr capsule     [provider]  amphetamine-dextroamphetamine (ADDERALL) 30 MG tablet Take 1 tablet by mouth every morning. 10/22/22   [provider]  etonogestrel-ethinyl estradiol (NUVARING) 0.12-0.015 MG/24HR vaginal ring SMARTSIG:1 Ring Vaginal Once a Month Patient not taking: Reported on 11/10/2022    [provider]  Ferrous Sulfate (IRON PO) Take by mouth.    [provider]  hydrOXYzine (ATARAX/VISTARIL) 25 MG tablet SMARTSIG:0.5-2 Tablet(s) By Mouth Twice Daily PRN 07/15/20   [provider]  melatonin 3 MG TABS tablet Take 3 mg by mouth at bedtime.    [provider]  Vitamin D, Ergocalciferol, (DRISDOL) 1.25 MG (50000 UNIT) CAPS capsule Take 1 capsule (50,000 Units total) by mouth every 7 (seven) days. 11/16/22   Avanell Shackleton, NP-C  Allergies Duricef [cefadroxil] and Latex  Review of Systems Review of Systems As noted in HPI  Physical Exam Vital Signs  I have reviewed the triage vital signs BP (!) 163/115 (BP Location: Left Arm)   Pulse (!) 107   Temp 97.9 F (36.6 C) (Oral)   Resp (!) 21   SpO2 99%   Physical Exam Vitals reviewed.  Constitutional:      General: She is not in acute distress.    Appearance: She is well-developed. She is obese. She is not diaphoretic.  HENT:     Head: Normocephalic and atraumatic.      Right Ear: External ear normal.     Left Ear: External ear normal.     Nose: Nose normal.  Eyes:     General: No scleral icterus.    Conjunctiva/sclera: Conjunctivae normal.  Neck:     Trachea: Phonation normal.  Cardiovascular:     Rate and Rhythm: Normal rate and regular rhythm.  Pulmonary:     Effort: Pulmonary effort is normal. No respiratory distress.     Breath sounds: No stridor.  Abdominal:     General: There is no distension.     Tenderness: There is abdominal tenderness in the right upper quadrant and epigastric area.  Musculoskeletal:        General: Normal range of motion.     Cervical back: Normal range of motion. No spasms, torticollis or tenderness.     Thoracic back: Spasms and tenderness present. No bony tenderness.     Lumbar back: No tenderness or bony tenderness.       Back:  Neurological:     Mental Status: She is alert and oriented to person, place, and time.  Psychiatric:        Behavior: Behavior normal.     ED Results and Treatments Labs (all labs ordered are listed, but only abnormal results are displayed) Labs Reviewed  COMPREHENSIVE METABOLIC PANEL  LIPASE, BLOOD  CBC WITH DIFFERENTIAL/PLATELET  URINALYSIS, ROUTINE W REFLEX MICROSCOPIC  HCG, SERUM, QUALITATIVE                                                                                                                         EKG  EKG Interpretation Date/Time:    Ventricular Rate:    PR Interval:    QRS Duration:    QT Interval:    QTC Calculation:   R Axis:      Text Interpretation:         Radiology No results found.  Medications Ordered in ED Medications  prochlorperazine (COMPAZINE) injection 10 mg (has no administration in time range)  HYDROmorphone (DILAUDID) injection 1 mg (has no administration in time range)   Procedures Procedures  (including critical care time) Medical Decision Making / ED Course   Medical Decision Making Amount and/or Complexity of Data  Reviewed Labs: ordered. Decision-making details documented in ED Course. Radiology: ordered and independent interpretation performed. Decision-making details documented in ED Course.  ECG/medicine tests: ordered and independent interpretation performed. Decision-making details documented in ED Course.  Risk Prescription drug management.    Right parascapular region pain  Differential and workup listed below  Favoring muscle strain/spasm of the parascapular musculature.  Will provide patient with IV pain medicine. Will rule out pneumothorax, pneumonia. Presentation not overtly concerning for PE. Doubt ACS.  Low suspicion for dissection.  Given upper abdominal pain and tenderness to palpation, will assess for impending obstruction/pancreatitis.  EKG without acute ischemic changes, dysrhythmias or blocks.  No evidence of pericarditis. CBC with leukocytosis.  Mild anemia. hCG negative ruling out pregnancy related process. Chest x-ray without evidence of pneumonia, pneumothorax, pulmonary edema pleural effusions.  Rest of w/u pending.  Patient care turned over to oncoming provider. Patient case and results discussed in detail; please see their note for further ED managment.       Final Clinical Impression(s) / ED Diagnoses Final diagnoses:  None    This chart was dictated using voice recognition software.  Despite best efforts to proofread,  errors can occur which can change the documentation meaning.    Nira Conn, MD 12/18/22 [phone]

## 2022-12-18 NOTE — ED Triage Notes (Signed)
Pt states that she is having back spasms that go into her abdomen since midnight. Pt reports vomiting.

## 2022-12-28 DIAGNOSIS — F413 Other mixed anxiety disorders: Secondary | ICD-10-CM | POA: Diagnosis not present

## 2022-12-29 ENCOUNTER — Ambulatory Visit: Payer: Self-pay | Admitting: Surgery

## 2022-12-29 DIAGNOSIS — K8010 Calculus of gallbladder with chronic cholecystitis without obstruction: Secondary | ICD-10-CM | POA: Diagnosis not present

## 2022-12-29 NOTE — H&P (View-Only) (Signed)
 Subjective    Chief Complaint: New Consultation       History of Present Illness: Amanda Wilson is a 32 y.o. Wilson who is seen today as an office consultation at the request of Dr. Room for evaluation of New Consultation .   This is a 32 year old Wilson who presents with a 1 month history of several episodes of postprandial right upper quadrant abdominal pain associated with nausea and vomiting.  The patient has also had recent diarrhea.  She presented to the emergency department for evaluation and was diagnosed with gallstones.  There is no sign of acute cholecystitis.  Liver function test were normal.  White blood cell count was mildly elevated at 12.3.  She was discharged home and has had only 1 attack since discharge.  She has been watching her diet carefully.  She does have chronic anemia although her most recent hemoglobin was 11.2.     Review of Systems: A complete review of systems was obtained from the patient.  I have reviewed this information and discussed as appropriate with the patient.  See HPI as well for other ROS.   Review of Systems  Constitutional: Negative.   HENT: Negative.    Eyes: Negative.   Respiratory: Negative.    Cardiovascular: Negative.   Gastrointestinal:  Positive for abdominal pain, diarrhea, nausea and vomiting.  Genitourinary: Negative.   Musculoskeletal:  Positive for back pain.  Skin: Negative.   Neurological: Negative.   Endo/Heme/Allergies: Negative.   Psychiatric/Behavioral: Negative.          Medical History: Past Medical History      Past Medical History:  Diagnosis Date   Anemia     Anxiety          Problem List     Patient Active Problem List  Diagnosis   ADHD   Anxiety and depression   Constipation   Family history of PCOS   Iron deficiency anemia due to chronic blood loss   Severe obesity (BMI >= 40) (CMS/HHS-HCC)   Vitamin D deficiency        Past Surgical History       Past Surgical History:  Procedure  Laterality Date   Hand surgery            Allergies       Allergies  Allergen Reactions   Latex, Natural Rubber Hives, Itching and Rash      Itchy hives   Cephalosporins Other (See Comments) and Rash      Skin sloughing  Peeling skin   Skin sloughing   Peeling skin        Medications Ordered Prior to Encounter        Current Outpatient Medications on File Prior to Visit  Medication Sig Dispense Refill   dextroamphetamine-amphetamine (ADDERALL XR) 20 MG XR capsule Take 1 capsule by mouth once daily       dextroamphetamine-amphetamine (ADDERALL XR) 30 MG XR capsule         ergocalciferol, vitamin D2, 1,250 mcg (Amanda,000 unit) capsule Take Amanda,000 Units by mouth every 7 (seven) days       ferrous sulfate (FER-IN-SOL) 15 mg iron (75 mg)/mL oral drops Take by mouth       hydrOXYzine (ATARAX) 25 MG tablet SMARTSIG:0.5-2 Tablet(s) By Mouth Twice Daily PRN        No current facility-administered medications on file prior to visit.        Family History       Family History  Problem  Relation Age of Onset   Skin cancer Mother     Coronary Artery Disease (Blocked arteries around heart) Father     Diabetes Father          Tobacco Use History  Social History       Tobacco Use  Smoking Status Never  Smokeless Tobacco Never        Social History  Social History        Socioeconomic History   Marital status: Single  Tobacco Use   Smoking status: Never   Smokeless tobacco: Never  Substance and Sexual Activity   Alcohol use: Never   Drug use: Never    Social Drivers of Acupuncturist Strain: Low Risk  (11/09/2022)    Received from [facility]    Overall Financial Resource Strain (CARDIA)     Difficulty of Paying Living Expenses: Not hard at all  Food Insecurity: Food Insecurity Present (11/09/2022)    Received from [facility]    Hunger Vital Sign     Worried About Running Out of Food in the Last Year: Sometimes true     Ran Out of Food in  the Last Year: Never true  Transportation Needs: No Transportation Needs (11/09/2022)    Received from [facility] - Transportation     Lack of Transportation (Medical): No     Lack of Transportation (Non-Medical): No  Physical Activity: Unknown (11/09/2022)    Received from [facility]    Exercise Vital Sign     Days of Exercise per Week: 0 days  Stress: Stress Concern Present (11/09/2022)    Received from [facility] - Occupational Stress Questionnaire     Feeling of Stress : To some extent  Social Connections: Socially Isolated (11/09/2022)    Received from [facility]    Social Connection and Isolation Panel [NHANES]     Frequency of Communication with Friends and Family: More than three times a week     Frequency of Social Gatherings with Friends and Family: Once a week     Attends Religious Services: Never     Database administrator or Organizations: No     Marital Status: Never married        Objective:          Vitals:    12/29/22 1412 12/29/22 1413  Pulse: (!) 123    Temp: 37.3 C (99.1 F)    SpO2: 98%    Weight: (!) 145.4 kg (320 lb 9.6 oz)    Height: 162.6 cm (5\' 4" )    PainSc:   0-No pain  PainLoc:   Abdomen    Body mass index is 55.03 kg/m.   Physical Exam    Constitutional:  WDWN in NAD, conversant, no obvious deformities; lying in bed comfortably Eyes:  Pupils equal, round; sclera anicteric; moist conjunctiva; no lid lag HENT:  Oral mucosa moist; good dentition  Neck:  No masses palpated, trachea midline; no thyromegaly Lungs:  CTA bilaterally; normal respiratory effort CV:  Regular rate and rhythm; no murmurs; extremities well-perfused with no edema Abd:  +bowel sounds, soft, mildly tender in the epigastrium and right upper quadrant with no palpable masses; no palpable organomegaly; no palpable hernias Musc:  Normal gait; no apparent clubbing or cyanosis in extremities Lymphatic:  No palpable  cervical or axillary lymphadenopathy Skin:  Warm, dry; no sign of jaundice  Psychiatric - alert and oriented x 4; calm mood and affect     Labs, Imaging and Diagnostic Testing: CLINICAL DATA:  32 year old Wilson with right upper quadrant pain since last night.   EXAM: ULTRASOUND ABDOMEN LIMITED RIGHT UPPER QUADRANT   COMPARISON:  Chest radiographs 0614 hours today.   FINDINGS: Gallbladder:   Numerous heterogeneous and shadowing gallstones (image 24). Stone size individually estimated up to 22 mm (image 18). Gallbladder wall thickness remains normal. No pericholecystic fluid. No sonographic Murphy sign elicited.   Common bile duct:   Diameter: 4-5 mm, normal.   Liver:   Suboptimal visualization due to bowel gas but within normal limits. Portal vein appears to be patent on color Doppler.   Other: Negative visible right kidney.  No free fluid identified.   IMPRESSION: Extensive cholelithiasis. No strong evidence of acute cholecystitis or bile duct obstruction.     Electronically Signed   By: Odessa Fleming M.D.   On: 12/18/2022 08:26     Assessment and Plan:  Diagnoses and all orders for this visit:   Calculus of gallbladder with chronic cholecystitis without obstruction     Recommend laparoscopic cholecystectomy with intraoperative cholangiogram.The surgical procedure has been discussed with the patient.  Potential risks, benefits, alternative treatments, and expected outcomes have been explained.  All of the patient's questions at this time have been answered.  The likelihood of reaching the patient's treatment goal is good.  The patient understands the proposed surgical procedure and wishes to proceed.     Lissa Morales, MD  12/29/2022 4:03 PM

## 2022-12-29 NOTE — H&P (Signed)
Subjective    Chief Complaint: New Consultation       History of Present Illness: Amanda Wilson is a 32 y.o. female who is seen today as an office consultation at the request of Dr. Room for evaluation of New Consultation .   This is a 32 year old female who presents with a 1 month history of several episodes of postprandial right upper quadrant abdominal pain associated with nausea and vomiting.  The patient has also had recent diarrhea.  She presented to the emergency department for evaluation and was diagnosed with gallstones.  There is no sign of acute cholecystitis.  Liver function test were normal.  White blood cell count was mildly elevated at 12.3.  She was discharged home and has had only 1 attack since discharge.  She has been watching her diet carefully.  She does have chronic anemia although her most recent hemoglobin was 11.2.     Review of Systems: A complete review of systems was obtained from the patient.  I have reviewed this information and discussed as appropriate with the patient.  See HPI as well for other ROS.   Review of Systems  Constitutional: Negative.   HENT: Negative.    Eyes: Negative.   Respiratory: Negative.    Cardiovascular: Negative.   Gastrointestinal:  Positive for abdominal pain, diarrhea, nausea and vomiting.  Genitourinary: Negative.   Musculoskeletal:  Positive for back pain.  Skin: Negative.   Neurological: Negative.   Endo/Heme/Allergies: Negative.   Psychiatric/Behavioral: Negative.          Medical History: Past Medical History      Past Medical History:  Diagnosis Date   Anemia     Anxiety          Problem List     Patient Active Problem List  Diagnosis   ADHD   Anxiety and depression   Constipation   Family history of PCOS   Iron deficiency anemia due to chronic blood loss   Severe obesity (BMI >= 40) (CMS/HHS-HCC)   Vitamin D deficiency        Past Surgical History       Past Surgical History:  Procedure  Laterality Date   Hand surgery            Allergies       Allergies  Allergen Reactions   Latex, Natural Rubber Hives, Itching and Rash      Itchy hives   Cephalosporins Other (See Comments) and Rash      Skin sloughing  Peeling skin   Skin sloughing   Peeling skin        Medications Ordered Prior to Encounter        Current Outpatient Medications on File Prior to Visit  Medication Sig Dispense Refill   dextroamphetamine-amphetamine (ADDERALL XR) 20 MG XR capsule Take 1 capsule by mouth once daily       dextroamphetamine-amphetamine (ADDERALL XR) 30 MG XR capsule         ergocalciferol, vitamin D2, 1,250 mcg (50,000 unit) capsule Take 50,000 Units by mouth every 7 (seven) days       ferrous sulfate (FER-IN-SOL) 15 mg iron (75 mg)/mL oral drops Take by mouth       hydrOXYzine (ATARAX) 25 MG tablet SMARTSIG:0.5-2 Tablet(s) By Mouth Twice Daily PRN        No current facility-administered medications on file prior to visit.        Family History       Family History  Problem  Relation Age of Onset   Skin cancer Mother     Coronary Artery Disease (Blocked arteries around heart) Father     Diabetes Father          Tobacco Use History  Social History       Tobacco Use  Smoking Status Never  Smokeless Tobacco Never        Social History  Social History        Socioeconomic History   Marital status: Single  Tobacco Use   Smoking status: Never   Smokeless tobacco: Never  Substance and Sexual Activity   Alcohol use: Never   Drug use: Never    Social Drivers of Acupuncturist Strain: Low Risk  (11/09/2022)    Received from [facility]    Overall Financial Resource Strain (CARDIA)     Difficulty of Paying Living Expenses: Not hard at all  Food Insecurity: Food Insecurity Present (11/09/2022)    Received from [facility]    Hunger Vital Sign     Worried About Running Out of Food in the Last Year: Sometimes true     Ran Out of Food in  the Last Year: Never true  Transportation Needs: No Transportation Needs (11/09/2022)    Received from [facility] - Transportation     Lack of Transportation (Medical): No     Lack of Transportation (Non-Medical): No  Physical Activity: Unknown (11/09/2022)    Received from [facility]    Exercise Vital Sign     Days of Exercise per Week: 0 days  Stress: Stress Concern Present (11/09/2022)    Received from [facility] - Occupational Stress Questionnaire     Feeling of Stress : To some extent  Social Connections: Socially Isolated (11/09/2022)    Received from [facility]    Social Connection and Isolation Panel [NHANES]     Frequency of Communication with Friends and Family: More than three times a week     Frequency of Social Gatherings with Friends and Family: Once a week     Attends Religious Services: Never     Database administrator or Organizations: No     Marital Status: Never married        Objective:          Vitals:    12/29/22 1412 12/29/22 1413  Pulse: (!) 123    Temp: 37.3 C (99.1 F)    SpO2: 98%    Weight: (!) 145.4 kg (320 lb 9.6 oz)    Height: 162.6 cm (5\' 4" )    PainSc:   0-No pain  PainLoc:   Abdomen    Body mass index is 55.03 kg/m.   Physical Exam    Constitutional:  WDWN in NAD, conversant, no obvious deformities; lying in bed comfortably Eyes:  Pupils equal, round; sclera anicteric; moist conjunctiva; no lid lag HENT:  Oral mucosa moist; good dentition  Neck:  No masses palpated, trachea midline; no thyromegaly Lungs:  CTA bilaterally; normal respiratory effort CV:  Regular rate and rhythm; no murmurs; extremities well-perfused with no edema Abd:  +bowel sounds, soft, mildly tender in the epigastrium and right upper quadrant with no palpable masses; no palpable organomegaly; no palpable hernias Musc:  Normal gait; no apparent clubbing or cyanosis in extremities Lymphatic:  No palpable  cervical or axillary lymphadenopathy Skin:  Warm, dry; no sign of jaundice  Psychiatric - alert and oriented x 4; calm mood and affect     Labs, Imaging and Diagnostic Testing: CLINICAL DATA:  32 year old female with right upper quadrant pain since last night.   EXAM: ULTRASOUND ABDOMEN LIMITED RIGHT UPPER QUADRANT   COMPARISON:  Chest radiographs 0614 hours today.   FINDINGS: Gallbladder:   Numerous heterogeneous and shadowing gallstones (image 24). Stone size individually estimated up to 22 mm (image 18). Gallbladder wall thickness remains normal. No pericholecystic fluid. No sonographic Murphy sign elicited.   Common bile duct:   Diameter: 4-5 mm, normal.   Liver:   Suboptimal visualization due to bowel gas but within normal limits. Portal vein appears to be patent on color Doppler.   Other: Negative visible right kidney.  No free fluid identified.   IMPRESSION: Extensive cholelithiasis. No strong evidence of acute cholecystitis or bile duct obstruction.     Electronically Signed   By: Odessa Fleming M.D.   On: 12/18/2022 08:26     Assessment and Plan:  Diagnoses and all orders for this visit:   Calculus of gallbladder with chronic cholecystitis without obstruction     Recommend laparoscopic cholecystectomy with intraoperative cholangiogram.The surgical procedure has been discussed with the patient.  Potential risks, benefits, alternative treatments, and expected outcomes have been explained.  All of the patient's questions at this time have been answered.  The likelihood of reaching the patient's treatment goal is good.  The patient understands the proposed surgical procedure and wishes to proceed.     Lissa Morales, MD  12/29/2022 4:03 PM

## 2023-01-06 NOTE — Pre-Procedure Instructions (Signed)
Surgical Instructions   Your procedure is scheduled on Tuesday, December 17th. Report to [facility] Main Entrance "A" at 1:00 P.M., then check in with the Admitting office. Any questions or running late day of surgery: call [phone]  Questions prior to your surgery date: call [phone], Monday-Friday, 8am-4pm. If you experience any cold or flu symptoms such as cough, fever, chills, shortness of breath, etc. between now and your scheduled surgery, please notify us at the above number.     Remember:  Do not eat after midnight the night before your surgery   You may drink clear liquids until 12:00 PM the day of your surgery.   Clear liquids allowed are: Water, Non-Citrus Juices (without pulp), Carbonated Beverages, Clear Tea (no milk, honey, etc.), Black Coffee Only (NO MILK, CREAM OR POWDERED CREAMER of any kind), and Gatorade.  Patient Instructions  The night before surgery:  No food after midnight. ONLY clear liquids after midnight  The day of surgery (if you do NOT have diabetes):  Drink ONE (1) Pre-Surgery Clear Ensure by 12:00 PM the day of surgery. Drink in one sitting. Do not sip.  This drink was given to you during your hospital  pre-op appointment visit.  Nothing else to drink after completing the  Pre-Surgery Clear Ensure.          If you have questions, please contact your surgeon's office.    Take these medicines the morning of surgery with A SIP OF WATER  May take these medicines IF NEEDED: hydrOXYzine (ATARAX/VISTARIL)    One week prior to surgery, STOP taking any Aspirin (unless otherwise instructed by your surgeon) Aleve, Naproxen, Ibuprofen, Motrin, Advil, Goody's, BC's, all herbal medications, fish oil, and non-prescription vitamins.                     Do NOT Smoke (Tobacco/Vaping) for 24 hours prior to your procedure.  If you use a CPAP at night, you may bring your mask/headgear for your overnight stay.   You will be asked to remove any contacts,  glasses, piercing's, hearing aid's, dentures/partials prior to surgery. Please bring cases for these items if needed.    Patients discharged the day of surgery will not be allowed to drive home, and someone needs to stay with them for 24 hours.  SURGICAL WAITING ROOM VISITATION Patients may have no more than 2 support people in the waiting area - these visitors may rotate.   Pre-op nurse will coordinate an appropriate time for 1 ADULT support person, who may not rotate, to accompany patient in pre-op.  Children under the age of 53 must have an adult with them who is not the patient and must remain in the main waiting area with an adult.  If the patient needs to stay at the hospital during part of their recovery, the visitor guidelines for inpatient rooms apply.  Please refer to the [facility] website for the visitor guidelines for any additional information.   If you received a COVID test during your pre-op visit  it is requested that you wear a mask when out in public, stay away from anyone that may not be feeling well and notify your surgeon if you develop symptoms. If you have been in contact with anyone that has tested positive in the last 10 days please notify you surgeon.      Pre-operative CHG Bathing Instructions   You can play a key role in reducing the risk of infection after surgery. Your skin  needs to be as free of germs as possible. You can reduce the number of germs on your skin by washing with CHG (chlorhexidine gluconate) soap before surgery. CHG is an antiseptic soap that kills germs and continues to kill germs even after washing.   DO NOT use if you have an allergy to chlorhexidine/CHG or antibacterial soaps. If your skin becomes reddened or irritated, stop using the CHG and notify one of our RNs at [phone].              TAKE A SHOWER THE NIGHT BEFORE SURGERY AND THE DAY OF SURGERY    Please keep in mind the following:  DO NOT shave, including legs and underarms,  48 hours prior to surgery.   You may shave your face before/day of surgery.  Place clean sheets on your bed the night before surgery Use a clean washcloth (not used since being washed) for each shower. DO NOT sleep with pet's night before surgery.  CHG Shower Instructions:  Wash your face and private area with normal soap. If you choose to wash your hair, wash first with your normal shampoo.  After you use shampoo/soap, rinse your hair and body thoroughly to remove shampoo/soap residue.  Turn the water OFF and apply half the bottle of CHG soap to a CLEAN washcloth.  Apply CHG soap ONLY FROM YOUR NECK DOWN TO YOUR TOES (washing for 3-5 minutes)  DO NOT use CHG soap on face, private areas, open wounds, or sores.  Pay special attention to the area where your surgery is being performed.  If you are having back surgery, having someone wash your back for you may be helpful. Wait 2 minutes after CHG soap is applied, then you may rinse off the CHG soap.  Pat dry with a clean towel  Put on clean pajamas    Additional instructions for the day of surgery: DO NOT APPLY any lotions, deodorants, cologne, or perfumes.   Do not wear jewelry or makeup Do not wear nail polish, gel polish, artificial nails, or any other type of covering on natural nails (fingers and toes) Do not bring valuables to the hospital. [facility] is not responsible for valuables/personal belongings. Put on clean/comfortable clothes.  Please brush your teeth.  Ask your nurse before applying any prescription medications to the skin.

## 2023-01-07 ENCOUNTER — Encounter (HOSPITAL_COMMUNITY)
Admission: RE | Admit: 2023-01-07 | Discharge: 2023-01-07 | Disposition: A | Discharge status: Home / Self Care | Payer: 59 | Source: Ambulatory Visit | Attending: Surgery | Admitting: Surgery

## 2023-01-07 ENCOUNTER — Encounter (HOSPITAL_COMMUNITY): Payer: Self-pay

## 2023-01-07 ENCOUNTER — Other Ambulatory Visit: Payer: Self-pay

## 2023-01-07 VITALS — BP 125/80 | HR 112 | Temp 98.4°F | Resp 18 | Ht 64.0 in | Wt 319.4 lb

## 2023-01-07 DIAGNOSIS — Z01818 Encounter for other preprocedural examination: Secondary | ICD-10-CM | POA: Insufficient documentation

## 2023-01-07 HISTORY — DX: Anemia, unspecified: D64.9

## 2023-01-07 HISTORY — DX: Attention-deficit hyperactivity disorder, unspecified type: F90.9

## 2023-01-07 LAB — CBC
HCT: 38.5 % (ref 36.0–46.0)
Hemoglobin: 11.4 g/dL — ABNORMAL LOW (ref 12.0–15.0)
MCH: 22.7 pg — ABNORMAL LOW (ref 26.0–34.0)
MCHC: 29.6 g/dL — ABNORMAL LOW (ref 30.0–36.0)
MCV: 76.7 fL — ABNORMAL LOW (ref 80.0–100.0)
Platelets: 373 10*3/uL (ref 150–400)
RBC: 5.02 MIL/uL (ref 3.87–5.11)
RDW: 14.4 % (ref 11.5–15.5)
WBC: 9 10*3/uL (ref 4.0–10.5)
nRBC: 0 % (ref 0.0–0.2)

## 2023-01-07 NOTE — Progress Notes (Signed)
PCP - Hetty Blend, NP Cardiologist - denies  PPM/ICD - denies   Chest x-ray - 12/18/22 EKG - 12/18/22 Stress Test - denies ECHO - denies Cardiac Cath - denies  Sleep Study - denies   DM- denies  Last dose of GLP1 agonist-  n/a   ASA/Blood Thinner Instructions: n/a   ERAS Protcol - yes PRE-SURGERY Ensure given at PAT  COVID TEST- n/a   Anesthesia review: no  Patient denies shortness of breath, fever, cough and chest pain at PAT appointment   All instructions explained to the patient, with a verbal understanding of the material. Patient agrees to go over the instructions while at home for a better understanding.  The opportunity to ask questions was provided.

## 2023-01-08 IMAGING — RF DG HAND 2V*R*
1 series · 2 of 2 positions shown · non-contrast
Comparison: November 05, 2019

FLUOROSCOPY TIME:  0 minutes, 3.3 second; 0.09 mGy; 2 acquired
images

CLINICAL DATA: Hardware removal

EXAM:
RIGHT HAND - 2 VIEW; DG C-ARM 1-60 MIN

[Series 1: run · 2 of 2 slices shown]
[im 1/2]
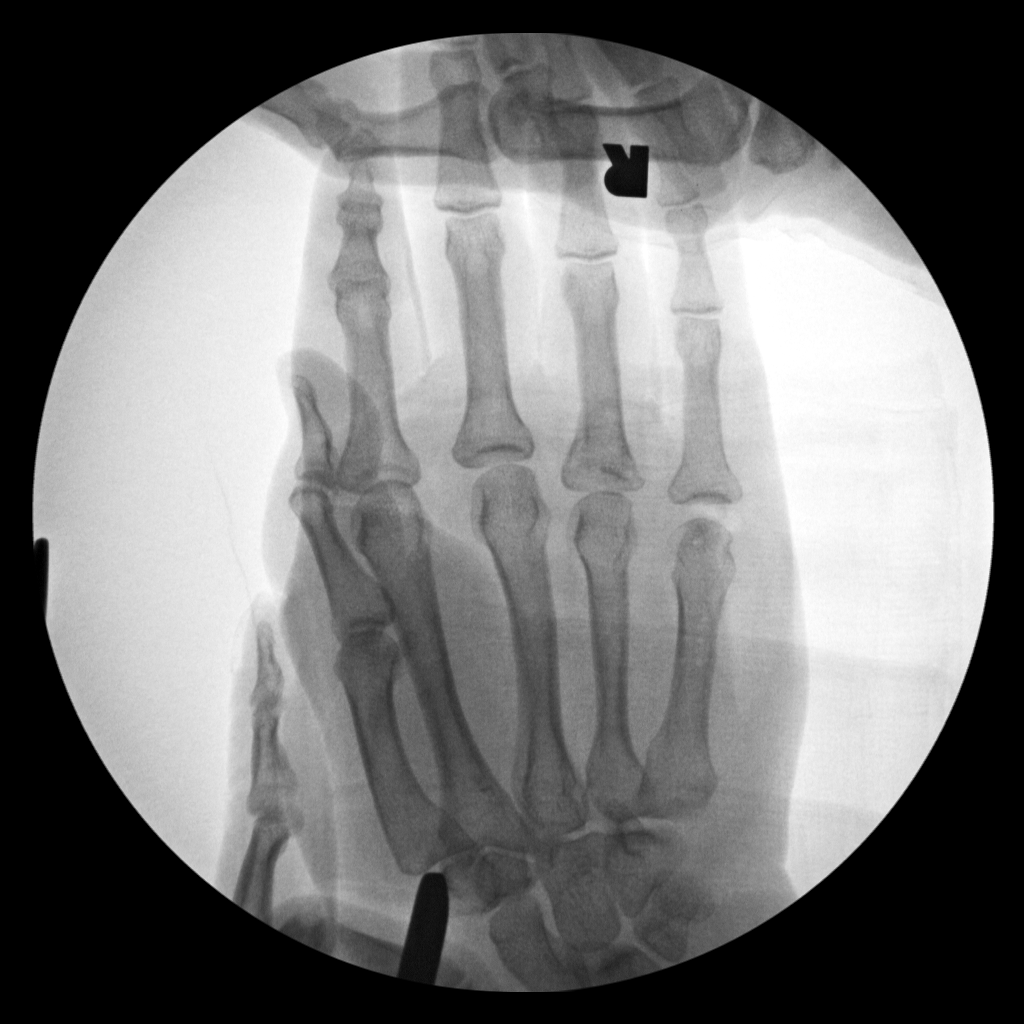
[im 2/2]
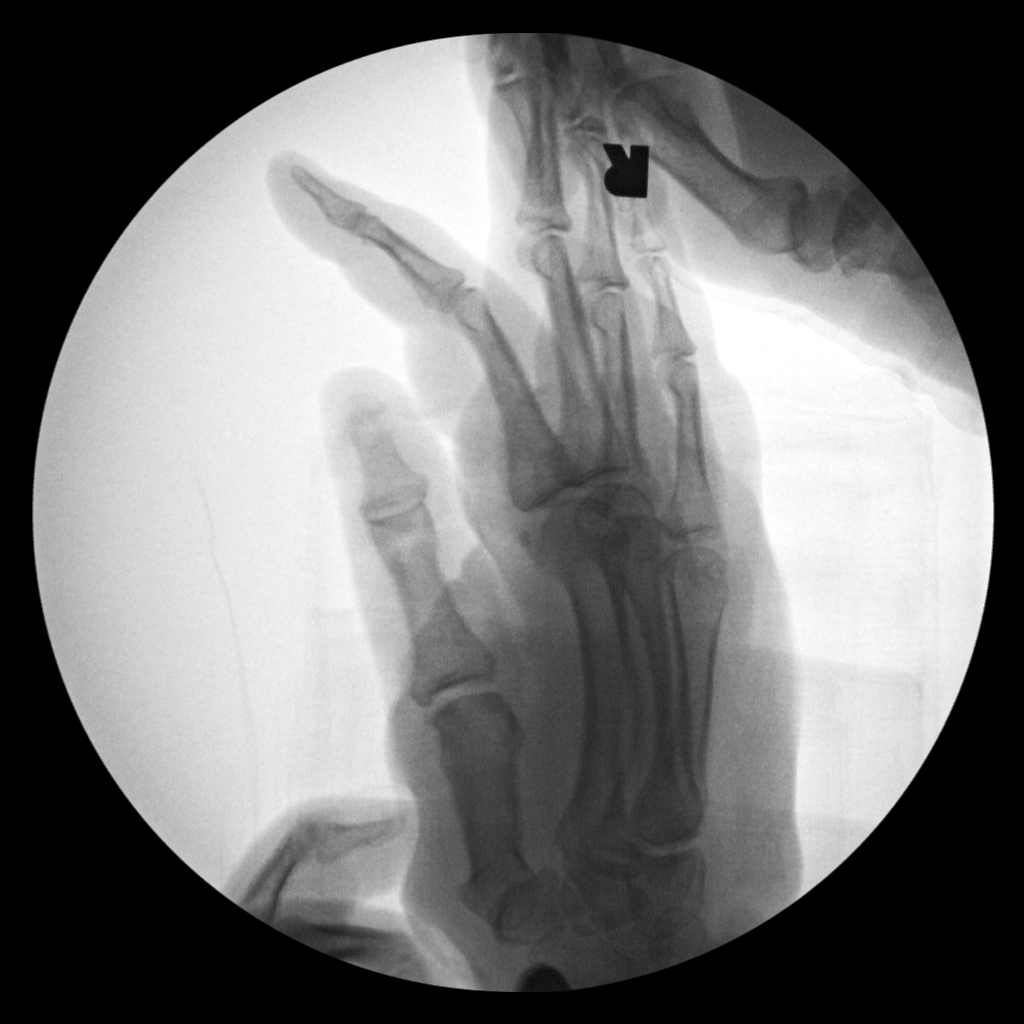

[2 of 2 positions shown; findings below may reference images not displayed]

FINDINGS: Frontal and oblique views obtained. Interval removal of screw and
plate fixation device from fifth metacarpal. No residual radiopaque
foreign body. No fracture or dislocation. Joint spaces appear
normal.
IMPRESSION: No evident radiopaque foreign body. No fracture or dislocation. No
appreciable joint space narrowing.

## 2023-01-11 ENCOUNTER — Ambulatory Visit (HOSPITAL_BASED_OUTPATIENT_CLINIC_OR_DEPARTMENT_OTHER): Payer: 59 | Admitting: Anesthesiology

## 2023-01-11 ENCOUNTER — Ambulatory Visit (HOSPITAL_COMMUNITY): Payer: 59 | Admitting: Anesthesiology

## 2023-01-11 ENCOUNTER — Other Ambulatory Visit: Payer: Self-pay

## 2023-01-11 ENCOUNTER — Encounter (HOSPITAL_COMMUNITY)
Admission: RE | Disposition: A | Discharge status: Home / Self Care | Payer: Self-pay | Source: Home / Self Care | Attending: Surgery

## 2023-01-11 ENCOUNTER — Ambulatory Visit (HOSPITAL_COMMUNITY): Payer: 59

## 2023-01-11 ENCOUNTER — Other Ambulatory Visit (HOSPITAL_COMMUNITY): Payer: Self-pay

## 2023-01-11 ENCOUNTER — Encounter (HOSPITAL_COMMUNITY): Payer: Self-pay | Admitting: Surgery

## 2023-01-11 ENCOUNTER — Ambulatory Visit (HOSPITAL_COMMUNITY)
Admission: RE | Admit: 2023-01-11 | Discharge: 2023-01-11 | Disposition: A | Discharge status: Home / Self Care | Payer: 59 | Attending: Surgery | Admitting: Surgery

## 2023-01-11 DIAGNOSIS — D500 Iron deficiency anemia secondary to blood loss (chronic): Secondary | ICD-10-CM | POA: Diagnosis not present

## 2023-01-11 DIAGNOSIS — K828 Other specified diseases of gallbladder: Secondary | ICD-10-CM | POA: Diagnosis not present

## 2023-01-11 DIAGNOSIS — E559 Vitamin D deficiency, unspecified: Secondary | ICD-10-CM | POA: Insufficient documentation

## 2023-01-11 DIAGNOSIS — F419 Anxiety disorder, unspecified: Secondary | ICD-10-CM | POA: Diagnosis not present

## 2023-01-11 DIAGNOSIS — Z79899 Other long term (current) drug therapy: Secondary | ICD-10-CM | POA: Insufficient documentation

## 2023-01-11 DIAGNOSIS — Z9049 Acquired absence of other specified parts of digestive tract: Secondary | ICD-10-CM | POA: Diagnosis not present

## 2023-01-11 DIAGNOSIS — Z01818 Encounter for other preprocedural examination: Secondary | ICD-10-CM

## 2023-01-11 DIAGNOSIS — F32A Depression, unspecified: Secondary | ICD-10-CM | POA: Insufficient documentation

## 2023-01-11 DIAGNOSIS — M549 Dorsalgia, unspecified: Secondary | ICD-10-CM | POA: Diagnosis not present

## 2023-01-11 DIAGNOSIS — R519 Headache, unspecified: Secondary | ICD-10-CM | POA: Insufficient documentation

## 2023-01-11 DIAGNOSIS — K8010 Calculus of gallbladder with chronic cholecystitis without obstruction: Secondary | ICD-10-CM | POA: Insufficient documentation

## 2023-01-11 DIAGNOSIS — D649 Anemia, unspecified: Secondary | ICD-10-CM | POA: Diagnosis not present

## 2023-01-11 DIAGNOSIS — Z6841 Body Mass Index (BMI) 40.0 and over, adult: Secondary | ICD-10-CM | POA: Diagnosis not present

## 2023-01-11 DIAGNOSIS — K8018 Calculus of gallbladder with other cholecystitis without obstruction: Secondary | ICD-10-CM | POA: Diagnosis not present

## 2023-01-11 DIAGNOSIS — F909 Attention-deficit hyperactivity disorder, unspecified type: Secondary | ICD-10-CM | POA: Insufficient documentation

## 2023-01-11 HISTORY — PX: CHOLECYSTECTOMY: SHX55

## 2023-01-11 LAB — POCT PREGNANCY, URINE: Preg Test, Ur: NEGATIVE

## 2023-01-11 SURGERY — LAPAROSCOPIC CHOLECYSTECTOMY WITH INTRAOPERATIVE CHOLANGIOGRAM
Anesthesia: General | Site: Abdomen

## 2023-01-11 MED ORDER — ACETAMINOPHEN 500 MG PO TABS
ORAL_TABLET | ORAL | Status: AC
  Administered 2023-01-11: 1000 mg via ORAL
  Filled 2023-01-11: qty 2

## 2023-01-11 MED ORDER — FENTANYL CITRATE (PF) 250 MCG/5ML IJ SOLN
INTRAMUSCULAR | Status: AC
  Filled 2023-01-11: qty 5

## 2023-01-11 MED ORDER — HYDROMORPHONE HCL 1 MG/ML IJ SOLN
INTRAMUSCULAR | Status: AC
  Filled 2023-01-11: qty 1

## 2023-01-11 MED ORDER — SODIUM CHLORIDE 0.9 % IV SOLN: INTRAVENOUS | Status: DC

## 2023-01-11 MED ORDER — BUPIVACAINE HCL (PF) 0.25 % IJ SOLN
INTRAMUSCULAR | Status: AC
  Filled 2023-01-11: qty 30

## 2023-01-11 MED ORDER — PHENYLEPHRINE 80 MCG/ML (10ML) SYRINGE FOR IV PUSH (FOR BLOOD PRESSURE SUPPORT)
PREFILLED_SYRINGE | INTRAVENOUS | Status: DC | PRN
  Administered 2023-01-11 (×2): 80 ug via INTRAVENOUS

## 2023-01-11 MED ORDER — ONDANSETRON HCL 4 MG/2ML IJ SOLN: 4.0000 mg | Freq: Once | INTRAMUSCULAR | Status: DC | PRN

## 2023-01-11 MED ORDER — 0.9 % SODIUM CHLORIDE (POUR BTL) OPTIME
TOPICAL | Status: DC | PRN
  Administered 2023-01-11: 1000 mL

## 2023-01-11 MED ORDER — OXYCODONE HCL 5 MG PO TABS: 5.0000 mg | ORAL_TABLET | Freq: Once | ORAL | Status: DC | PRN

## 2023-01-11 MED ORDER — ONDANSETRON HCL 4 MG/2ML IJ SOLN
INTRAMUSCULAR | Status: AC
  Filled 2023-01-11: qty 2

## 2023-01-11 MED ORDER — CIPROFLOXACIN IN D5W 400 MG/200ML IV SOLN
INTRAVENOUS | Status: AC
  Filled 2023-01-11: qty 200

## 2023-01-11 MED ORDER — MIDAZOLAM HCL 2 MG/2ML IJ SOLN
INTRAMUSCULAR | Status: DC | PRN
  Administered 2023-01-11: 2 mg via INTRAVENOUS

## 2023-01-11 MED ORDER — BUPIVACAINE HCL 0.25 % IJ SOLN
INTRAMUSCULAR | Status: DC | PRN
  Administered 2023-01-11: 17 mL

## 2023-01-11 MED ORDER — CIPROFLOXACIN IN D5W 400 MG/200ML IV SOLN
400.0000 mg | INTRAVENOUS | Status: AC
  Administered 2023-01-11: 400 mg via INTRAVENOUS

## 2023-01-11 MED ORDER — ESMOLOL HCL 100 MG/10ML IV SOLN
INTRAVENOUS | Status: DC | PRN
  Administered 2023-01-11: 20 mg via INTRAVENOUS

## 2023-01-11 MED ORDER — OXYCODONE HCL 5 MG PO TABS
5.0000 mg | ORAL_TABLET | Freq: Four times a day (QID) | ORAL | 0 refills | Status: DC | PRN
  Filled 2023-01-11: qty 20, 5d supply, fill #0

## 2023-01-11 MED ORDER — KETOROLAC TROMETHAMINE 30 MG/ML IJ SOLN
INTRAMUSCULAR | Status: DC | PRN
  Administered 2023-01-11: 30 mg via INTRAVENOUS

## 2023-01-11 MED ORDER — CHLORHEXIDINE GLUCONATE 0.12 % MT SOLN
OROMUCOSAL | Status: AC
  Administered 2023-01-11: 15 mL via OROMUCOSAL
  Filled 2023-01-11: qty 15

## 2023-01-11 MED ORDER — DEXAMETHASONE SODIUM PHOSPHATE 10 MG/ML IJ SOLN
INTRAMUSCULAR | Status: AC
  Filled 2023-01-11: qty 1

## 2023-01-11 MED ORDER — LIDOCAINE 2% (20 MG/ML) 5 ML SYRINGE
INTRAMUSCULAR | Status: DC | PRN
  Administered 2023-01-11: 100 mg via INTRAVENOUS

## 2023-01-11 MED ORDER — PROPOFOL 10 MG/ML IV BOLUS
INTRAVENOUS | Status: AC
  Filled 2023-01-11: qty 20

## 2023-01-11 MED ORDER — KETAMINE HCL 100 MG/ML IJ SOLN
INTRAMUSCULAR | Status: AC
  Filled 2023-01-11: qty 1

## 2023-01-11 MED ORDER — HYDROMORPHONE HCL 1 MG/ML IJ SOLN
0.2500 mg | INTRAMUSCULAR | Status: DC | PRN
  Administered 2023-01-11 (×4): 0.5 mg via INTRAVENOUS

## 2023-01-11 MED ORDER — CHLORHEXIDINE GLUCONATE 0.12 % MT SOLN: 15.0000 mL | Freq: Once | OROMUCOSAL | Status: AC

## 2023-01-11 MED ORDER — ACETAMINOPHEN 500 MG PO TABS: 1000.0000 mg | ORAL_TABLET | Freq: Once | ORAL | Status: AC

## 2023-01-11 MED ORDER — AMISULPRIDE (ANTIEMETIC) 5 MG/2ML IV SOLN
INTRAVENOUS | Status: AC
  Filled 2023-01-11: qty 4

## 2023-01-11 MED ORDER — SCOPOLAMINE 1 MG/3DAYS TD PT72
MEDICATED_PATCH | TRANSDERMAL | Status: AC
  Administered 2023-01-11: 1.5 mg via TRANSDERMAL
  Filled 2023-01-11: qty 1

## 2023-01-11 MED ORDER — FENTANYL CITRATE (PF) 250 MCG/5ML IJ SOLN
INTRAMUSCULAR | Status: DC | PRN
  Administered 2023-01-11 (×3): 50 ug via INTRAVENOUS
  Administered 2023-01-11: 100 ug via INTRAVENOUS

## 2023-01-11 MED ORDER — ROCURONIUM BROMIDE 10 MG/ML (PF) SYRINGE
PREFILLED_SYRINGE | INTRAVENOUS | Status: AC
  Filled 2023-01-11: qty 10

## 2023-01-11 MED ORDER — SODIUM CHLORIDE 0.9 % IV SOLN
INTRAVENOUS | Status: DC | PRN
  Administered 2023-01-11: 5 mL

## 2023-01-11 MED ORDER — AMISULPRIDE (ANTIEMETIC) 5 MG/2ML IV SOLN
10.0000 mg | Freq: Once | INTRAVENOUS | Status: AC | PRN
  Administered 2023-01-11: 10 mg via INTRAVENOUS

## 2023-01-11 MED ORDER — CHLORHEXIDINE GLUCONATE CLOTH 2 % EX PADS: 6.0000 | MEDICATED_PAD | Freq: Once | CUTANEOUS | Status: DC

## 2023-01-11 MED ORDER — KETAMINE HCL 100 MG/ML IJ SOLN
INTRAMUSCULAR | Status: DC | PRN
  Administered 2023-01-11: 50 mg via INTRAMUSCULAR

## 2023-01-11 MED ORDER — ONDANSETRON HCL 4 MG/2ML IJ SOLN
INTRAMUSCULAR | Status: DC | PRN
  Administered 2023-01-11: 4 mg via INTRAVENOUS

## 2023-01-11 MED ORDER — SCOPOLAMINE 1 MG/3DAYS TD PT72
1.0000 | MEDICATED_PATCH | TRANSDERMAL | Status: DC
Start: 2023-01-11 — End: 2023-01-11

## 2023-01-11 MED ORDER — ORAL CARE MOUTH RINSE: 15.0000 mL | Freq: Once | OROMUCOSAL | Status: AC

## 2023-01-11 MED ORDER — MEPERIDINE HCL 25 MG/ML IJ SOLN: 6.2500 mg | INTRAMUSCULAR | Status: DC | PRN

## 2023-01-11 MED ORDER — SUGAMMADEX SODIUM 200 MG/2ML IV SOLN
INTRAVENOUS | Status: DC | PRN
  Administered 2023-01-11: 400 mg via INTRAVENOUS

## 2023-01-11 MED ORDER — ROCURONIUM BROMIDE 10 MG/ML (PF) SYRINGE
PREFILLED_SYRINGE | INTRAVENOUS | Status: DC | PRN
  Administered 2023-01-11: 100 mg via INTRAVENOUS

## 2023-01-11 MED ORDER — KETOROLAC TROMETHAMINE 30 MG/ML IJ SOLN: 30.0000 mg | Freq: Once | INTRAMUSCULAR | Status: DC | PRN

## 2023-01-11 MED ORDER — PROPOFOL 10 MG/ML IV BOLUS
INTRAVENOUS | Status: DC | PRN
  Administered 2023-01-11: 300 mg via INTRAVENOUS
  Administered 2023-01-11: 50 mg via INTRAVENOUS

## 2023-01-11 MED ORDER — MIDAZOLAM HCL 2 MG/2ML IJ SOLN
INTRAMUSCULAR | Status: AC
  Filled 2023-01-11: qty 2

## 2023-01-11 MED ORDER — ACETAMINOPHEN 500 MG PO TABS: 1000.0000 mg | ORAL_TABLET | ORAL | Status: DC

## 2023-01-11 MED ORDER — SODIUM CHLORIDE 0.9 % IR SOLN
Status: DC | PRN
  Administered 2023-01-11: 1000 mL

## 2023-01-11 MED ORDER — LIDOCAINE 2% (20 MG/ML) 5 ML SYRINGE
INTRAMUSCULAR | Status: AC
  Filled 2023-01-11: qty 5

## 2023-01-11 MED ORDER — DEXAMETHASONE SODIUM PHOSPHATE 10 MG/ML IJ SOLN
INTRAMUSCULAR | Status: DC | PRN
  Administered 2023-01-11: 10 mg via INTRAVENOUS

## 2023-01-11 MED ORDER — OXYCODONE HCL 5 MG/5ML PO SOLN: 5.0000 mg | Freq: Once | ORAL | Status: DC | PRN

## 2023-01-11 SURGICAL SUPPLY — 43 items
APPLIER CLIP ROT 10 11.4 M/L (STAPLE) ×1
BAG COUNTER SPONGE SURGICOUNT (BAG) ×1 IMPLANT
BENZOIN TINCTURE PRP APPL 2/3 (GAUZE/BANDAGES/DRESSINGS) ×1 IMPLANT
BLADE CLIPPER SURG (BLADE) IMPLANT
CANISTER SUCT 3000ML PPV (MISCELLANEOUS) ×1 IMPLANT
CHLORAPREP W/TINT 26 (MISCELLANEOUS) ×1 IMPLANT
CLIP APPLIE ROT 10 11.4 M/L (STAPLE) ×1 IMPLANT
COVER MAYO STAND STRL (DRAPES) ×1 IMPLANT
COVER SURGICAL LIGHT HANDLE (MISCELLANEOUS) ×1 IMPLANT
DRAPE C-ARM 42X120 X-RAY (DRAPES) ×1 IMPLANT
DRSG TEGADERM 2-3/8X2-3/4 SM (GAUZE/BANDAGES/DRESSINGS) ×3 IMPLANT
DRSG TEGADERM 4X4.75 (GAUZE/BANDAGES/DRESSINGS) ×1 IMPLANT
ELECT REM PT RETURN 9FT ADLT (ELECTROSURGICAL) ×1
ELECTRODE REM PT RTRN 9FT ADLT (ELECTROSURGICAL) ×1 IMPLANT
GAUZE SPONGE 2X2 8PLY STRL LF (GAUZE/BANDAGES/DRESSINGS) ×1 IMPLANT
GLOVE BIO SURGEON STRL SZ7 (GLOVE) ×1 IMPLANT
GLOVE BIOGEL PI IND STRL 7.5 (GLOVE) ×1 IMPLANT
GOWN STRL REUS W/ TWL LRG LVL3 (GOWN DISPOSABLE) ×3 IMPLANT
IRRIG SUCT STRYKERFLOW 2 WTIP (MISCELLANEOUS) ×1
IRRIGATION SUCT STRKRFLW 2 WTP (MISCELLANEOUS) ×1 IMPLANT
KIT BASIN OR (CUSTOM PROCEDURE TRAY) ×1 IMPLANT
KIT IMAGING PINPOINTPAQ (MISCELLANEOUS) IMPLANT
KIT TURNOVER KIT B (KITS) ×1 IMPLANT
NS IRRIG 1000ML POUR BTL (IV SOLUTION) ×1 IMPLANT
PAD ARMBOARD 7.5X6 YLW CONV (MISCELLANEOUS) ×1 IMPLANT
POUCH RETRIEVAL ECOSAC 10 (ENDOMECHANICALS) IMPLANT
SCISSORS LAP 5X35 DISP (ENDOMECHANICALS) ×1 IMPLANT
SET CHOLANGIOGRAPH 5 50 .035 (SET/KITS/TRAYS/PACK) ×1 IMPLANT
SET TUBE SMOKE EVAC HIGH FLOW (TUBING) ×1 IMPLANT
SLEEVE Z-THREAD 5X100MM (TROCAR) ×1 IMPLANT
SPECIMEN JAR SMALL (MISCELLANEOUS) ×1 IMPLANT
STRIP CLOSURE SKIN 1/2X4 (GAUZE/BANDAGES/DRESSINGS) ×1 IMPLANT
SUT MNCRL AB 4-0 PS2 18 (SUTURE) ×1 IMPLANT
SYS BAG RETRIEVAL 10MM (BASKET)
SYSTEM BAG RETRIEVAL 10MM (BASKET) IMPLANT
TOWEL GREEN STERILE (TOWEL DISPOSABLE) ×1 IMPLANT
TOWEL GREEN STERILE FF (TOWEL DISPOSABLE) ×1 IMPLANT
TRAY LAPAROSCOPIC MC (CUSTOM PROCEDURE TRAY) ×1 IMPLANT
TROCAR 11X100 Z THREAD (TROCAR) ×1 IMPLANT
TROCAR BALLN 12MMX100 BLUNT (TROCAR) ×1 IMPLANT
TROCAR Z-THREAD OPTICAL 5X100M (TROCAR) ×1 IMPLANT
WARMER LAPAROSCOPE (MISCELLANEOUS) ×1 IMPLANT
WATER STERILE IRR 1000ML POUR (IV SOLUTION) ×1 IMPLANT

## 2023-01-11 NOTE — Anesthesia Procedure Notes (Signed)
Procedure Name: Intubation Date/Time: 01/11/2023 3:14 PM  Performed by: Pincus Large, CRNAPre-anesthesia Checklist: Patient identified, Emergency Drugs available, Suction available and Patient being monitored Patient Re-evaluated:Patient Re-evaluated prior to induction Oxygen Delivery Method: Circle System Utilized Preoxygenation: Pre-oxygenation with 100% oxygen Induction Type: IV induction Ventilation: Mask ventilation without difficulty Laryngoscope Size: Mac and 3 Grade View: Grade II Tube type: Oral Tube size: 7.0 mm Number of attempts: 1 Airway Equipment and Method: Stylet and Oral airway Placement Confirmation: ETT inserted through vocal cords under direct vision, positive ETCO2 and breath sounds checked- equal and bilateral Secured at: 22 cm Tube secured with: Tape Dental Injury: Teeth and Oropharynx as per pre-operative assessment

## 2023-01-11 NOTE — Transfer of Care (Signed)
Immediate Anesthesia Transfer of Care Note  Patient: Amanda Wilson  Procedure(s) Performed: LAPAROSCOPIC CHOLECYSTECTOMY WITH INTRAOPERATIVE CHOLANGIOGRAM (Abdomen)  Patient Location: PACU  Anesthesia Type:General  Level of Consciousness: awake, sedated, and drowsy  Airway & Oxygen Therapy: Patient Spontanous Breathing and Patient connected to face mask oxygen  Post-op Assessment: Report given to RN and Post -op Vital signs reviewed and stable  Post vital signs: Reviewed and stable  Last Vitals:  Vitals Value Taken Time  BP 107/63 01/11/23 1649  Temp 97.5   Pulse 80 01/11/23 1651  Resp 18 01/11/23 1651  SpO2 100 % 01/11/23 1651  Vitals shown include unfiled device data.  Last Pain:  Vitals:   01/11/23 1311  TempSrc:   PainSc: 0-No pain         Complications: No notable events documented.

## 2023-01-11 NOTE — Op Note (Addendum)
Laparoscopic Cholecystectomy with IOC Procedure Note  Indications: This is a 32 year old female who presents with a 1 month history of several episodes of postprandial right upper quadrant abdominal pain associated with nausea and vomiting.  The patient has also had recent diarrhea.  She presented to the emergency department for evaluation and was diagnosed with gallstones.  There is no sign of acute cholecystitis.  Liver function test were normal.  White blood cell count was mildly elevated at 12.3.  She was discharged home and has had only 1 attack since discharge.  She has been watching her diet carefully.  She does have chronic anemia although her most recent hemoglobin was 11.2.  BMI 55    Pre-operative Diagnosis: Calculus of gallbladder with other cholecystitis, without mention of obstruction; morbid obesity  Post-operative Diagnosis: Same  Surgeon: Wynona Luna   Assistants: Berenda Morale, RNFA  Anesthesia: General endotracheal anesthesia  ASA Class: 2  Procedure Details  The patient was seen again in the Holding Room. The risks, benefits, complications, treatment options, and expected outcomes were discussed with the patient. The possibilities of reaction to medication, pulmonary aspiration, perforation of viscus, bleeding, recurrent infection, finding a normal gallbladder, the need for additional procedures, failure to diagnose a condition, the possible need to convert to an open procedure, and creating a complication requiring transfusion or operation were discussed with the patient. The likelihood of improving the patient's symptoms with return to their baseline status is good.  The patient and/or family concurred with the proposed plan, giving informed consent. The site of surgery properly noted. The patient was taken to Operating Room, identified as Amanda Wilson and the procedure verified as Laparoscopic Cholecystectomy with Intraoperative Cholangiogram. A Time Out was held  and the above information confirmed.  Prior to the induction of general anesthesia, antibiotic prophylaxis was administered. General endotracheal anesthesia was then administered and tolerated well. After the induction, the abdomen was prepped with Chloraprep and draped in the sterile fashion. The patient was positioned in the supine position.  Local anesthetic agent was injected into the skin above the umbilicus and an incision made. We dissected down to the abdominal fascia with blunt dissection.  Visualization was difficult due to the thickness of the subcutaneous adipose tissue.  The fascia was incised vertically and we entered the peritoneal cavity bluntly.  A pursestring suture of 0-Vicryl was placed around the fascial opening.  The Hasson cannula was inserted and secured with the balloon tip.  Pneumoperitoneum was then created with CO2 and tolerated well without any adverse changes in the patient's vital signs. An 11-mm port was placed in the subxiphoid position.  Two 5-mm ports were placed in the right upper quadrant. All skin incisions were infiltrated with a local anesthetic agent before making the incision and placing the trocars.   We positioned the patient in reverse Trendelenburg, tilted slightly to the patient's left.  The gallbladder was identified, the fundus grasped and retracted cephalad. There are omental adhesions to the gallbladder wall.  Adhesions were lysed bluntly and with the electrocautery where indicated, taking care not to injure any adjacent organs or viscus. The infundibulum was grasped and retracted laterally, exposing the peritoneum overlying the triangle of Calot. This was then divided and exposed in a blunt fashion. A critical view of the cystic duct and cystic artery was obtained.  The cystic duct was clearly identified and bluntly dissected circumferentially. The cystic duct was ligated with a clip distally.   An incision was made in the cystic  duct and the [facility] [place]  cholangiogram catheter introduced. The catheter was secured using a clip. A cholangiogram was then obtained which showed good visualization of the distal and proximal biliary tree with no sign of filling defects or obstruction.  Contrast flowed easily into the duodenum. The catheter was then removed.   The cystic duct was then ligated with clips and divided. The cystic artery was identified, dissected free, ligated with clips and divided as well.   The gallbladder was dissected from the liver bed in retrograde fashion with the electrocautery. The gallbladder was removed and placed in an Eco sac. The liver bed was irrigated and inspected. Hemostasis was achieved with the electrocautery. Copious irrigation was utilized and was repeatedly aspirated until clear.  The gallbladder and Eco sac were then removed through the umbilical port site. We had to enlarge the fascial opening to allow removal of the gallbladder.0 Vicryl suture was used to close the umbilical fascia.    We again inspected the right upper quadrant for hemostasis.  Pneumoperitoneum was released as we removed the trocars.  4-0 Monocryl was used to close the skin.   Benzoin, steri-strips, and clean dressings were applied. The patient was then extubated and brought to the recovery room in stable condition. Instrument, sponge, and needle counts were correct at closure and at the conclusion of the case.   Findings: Cholecystitis with Cholelithiasis  Estimated Blood Loss: Minimal         Drains: none         Specimens: Gallbladder           Complications: None; patient tolerated the procedure well.         Disposition: PACU - hemodynamically stable.         Condition: stable  Wilmon Arms. Corliss Skains, MD, [facility]  General Surgery   01/11/2023 4:34 PM

## 2023-01-11 NOTE — Interval H&P Note (Signed)
History and Physical Interval Note:  01/11/2023 12:40 PM  Amanda Wilson  has presented today for surgery, with the diagnosis of Chronic calculus cholecystitis.  The various methods of treatment have been discussed with the patient and family. After consideration of risks, benefits and other options for treatment, the patient has consented to  Procedure(s) with comments: LAPAROSCOPIC CHOLECYSTECTOMY WITH INTRAOPERATIVE CHOLANGIOGRAM (N/A) - RNFA Assist as a surgical intervention.  The patient's history has been reviewed, patient examined, no change in status, stable for surgery.  I have reviewed the patient's chart and labs.  Questions were answered to the patient's satisfaction.     Wynona Luna

## 2023-01-11 NOTE — Anesthesia Preprocedure Evaluation (Addendum)
Anesthesia Evaluation  Patient identified by MRN, date of birth, ID band Patient awake    Reviewed: Allergy & Precautions, NPO status , Patient's Chart, lab work & pertinent test results, reviewed documented beta blocker date and time   History of Anesthesia Complications (+) Family history of anesthesia reaction and history of anesthetic complications (porlonged emergence in father, failed nerve block for finger surgery in past)  Airway Mallampati: II  TM Distance: >3 FB Neck ROM: Full    Dental  (+) Teeth Intact, Dental Advisory Given   Pulmonary neg pulmonary ROS   Pulmonary exam normal breath sounds clear to auscultation       Cardiovascular negative cardio ROS Normal cardiovascular exam Rhythm:Regular Rate:Normal     Neuro/Psych  Headaches PSYCHIATRIC DISORDERS Anxiety Depression       GI/Hepatic negative GI ROS, Neg liver ROS,,,  Endo/Other    Class 4 obesityBMI 55  Renal/GU negative Renal ROS  negative genitourinary   Musculoskeletal negative musculoskeletal ROS (+)    Abdominal  (+) + obese  Peds  Hematology negative hematology ROS (+) Hb 11.4   Anesthesia Other Findings   Reproductive/Obstetrics negative OB ROS                             Anesthesia Physical Anesthesia Plan  ASA: 3  Anesthesia Plan: General   Post-op Pain Management: Tylenol PO (pre-op)*, Toradol IV (intra-op)*, Precedex, Ketamine IV* and Dilaudid IV   Induction: Intravenous  PONV Risk Score and Plan: 4 or greater and Ondansetron, Dexamethasone, Midazolam, Treatment may vary due to age or medical condition and Scopolamine patch - Pre-op  Airway Management Planned: Oral ETT  Additional Equipment: None  Intra-op Plan:   Post-operative Plan: Extubation in OR  Informed Consent: I have reviewed the patients History and Physical, chart, labs and discussed the procedure including the risks, benefits and  alternatives for the proposed anesthesia with the patient or authorized representative who has indicated his/her understanding and acceptance.     Dental advisory given  Plan Discussed with: CRNA  Anesthesia Plan Comments:        Anesthesia Quick Evaluation

## 2023-01-11 NOTE — Discharge Instructions (Signed)

## 2023-01-12 ENCOUNTER — Encounter (HOSPITAL_COMMUNITY): Payer: Self-pay | Admitting: Surgery

## 2023-01-12 NOTE — Anesthesia Postprocedure Evaluation (Signed)
Anesthesia Post Note  Patient: Amanda Wilson  Procedure(s) Performed: LAPAROSCOPIC CHOLECYSTECTOMY WITH INTRAOPERATIVE CHOLANGIOGRAM (Abdomen)     Patient location during evaluation: PACU Anesthesia Type: General Level of consciousness: awake and alert, oriented and patient cooperative Pain management: pain level controlled Vital Signs Assessment: post-procedure vital signs reviewed and stable Respiratory status: spontaneous breathing, nonlabored ventilation and respiratory function stable Cardiovascular status: blood pressure returned to baseline and stable Postop Assessment: no apparent nausea or vomiting Anesthetic complications: no   No notable events documented.  Last Vitals:  Vitals:   01/11/23 1745 01/11/23 1800  BP: 118/70 121/66  Pulse: 95 91  Resp: 16 11  Temp:  36.6 C  SpO2: 94% 95%    Last Pain:  Vitals:   01/11/23 1715  TempSrc:   PainSc: 7                  Lannie Fields

## 2023-01-13 LAB — SURGICAL PATHOLOGY

## 2023-01-14 DIAGNOSIS — F413 Other mixed anxiety disorders: Secondary | ICD-10-CM | POA: Diagnosis not present

## 2023-02-04 DIAGNOSIS — F413 Other mixed anxiety disorders: Secondary | ICD-10-CM | POA: Diagnosis not present

## 2023-02-07 NOTE — Progress Notes (Signed)
   PROVIDER:  DONNICE DEWAYNE LIMA, MD  MRN: [id number] DOB: September 30, 1990 DATE OF ENCOUNTER: 02/07/2023 Interval History:   This is a 33 year old female who presents with a 1 month history of several episodes of postprandial right upper quadrant abdominal pain associated with nausea and vomiting. The patient has also had recent diarrhea. She presented to the emergency department for evaluation and was diagnosed with gallstones. There is no sign of acute cholecystitis. Liver function test were normal. White blood cell count was mildly elevated at 12.3. She was discharged home and has had only 1 attack since discharge. She has been watching her diet carefully. She does have chronic anemia although her most recent hemoglobin was 11.2.   On 01/11/2023, she underwent laparoscopic cholecystectomy with intraoperative cholangiogram.  Pathology confirmed chronic calculus cholecystitis.  She is doing better.  She does have occasional intermittent post prandial diarrhea but this is improving.  Abdominal pain has resolved.  Physical Examination:   Physical Exam   Well-developed well-nourished in no apparent distress Skin shows no jaundice Abdomen is soft and nontender Her incisions are well-healed no sign of infection Assessment and Plan:   Amanda Wilson is a 33 y.o. female who underwent laparoscopic cholecystectomy with intraoperative cholangiogram on 01/11/2023.  Diagnoses and all orders for this visit:  Calculus of gallbladder with chronic cholecystitis without obstruction     Resume regular diet and full activity  Return if symptoms worsen or fail to improve.   The plan was discussed in detail with the patient today, who expressed understanding.  The patient has my contact information, and understands to call me with any additional questions or concerns in the interval.  I would be happy to see the patient back sooner if the need arises.   MATTHEW KAI TSUEI, MD

## 2023-02-18 ENCOUNTER — Telehealth: Payer: Self-pay | Admitting: Family Medicine

## 2023-02-18 NOTE — Telephone Encounter (Signed)
Called pt and she reports she is stable on current medication and has been taking it for over a year.

## 2023-02-18 NOTE — Telephone Encounter (Signed)
Copied from CRM [id number]. Topic: Clinical - Medication Question >> Feb 18, 2023  9:30 AM Amanda Wilson wrote: Reason for CRM: Patient would like to know if the medications her psychiatrist prescribed could be prescribed/refilled by Dr. Suezanne Jacquet or if she would have to have an appointment first before doing so. Medications are  amphetamine-dextroamphetamine (ADDERALL) 20 MG tablet/amphetamine-dextroamphetamine (ADDERALL) 30 MG tablet/hydrOXYzine (ATARAX/VISTARIL) 25 MG tablet / please call [phone]

## 2023-02-25 DIAGNOSIS — F413 Other mixed anxiety disorders: Secondary | ICD-10-CM | POA: Diagnosis not present

## 2023-03-01 ENCOUNTER — Encounter: Payer: Self-pay | Admitting: Family Medicine

## 2023-03-01 ENCOUNTER — Ambulatory Visit: Payer: BC Managed Care – PPO | Admitting: Family Medicine

## 2023-03-01 VITALS — BP 122/84 | HR 105 | Temp 97.6°F | Ht 64.0 in | Wt 317.0 lb

## 2023-03-01 DIAGNOSIS — Z23 Encounter for immunization: Secondary | ICD-10-CM | POA: Diagnosis not present

## 2023-03-01 DIAGNOSIS — F909 Attention-deficit hyperactivity disorder, unspecified type: Secondary | ICD-10-CM

## 2023-03-01 DIAGNOSIS — F419 Anxiety disorder, unspecified: Secondary | ICD-10-CM

## 2023-03-01 DIAGNOSIS — E559 Vitamin D deficiency, unspecified: Secondary | ICD-10-CM

## 2023-03-01 DIAGNOSIS — D500 Iron deficiency anemia secondary to blood loss (chronic): Secondary | ICD-10-CM

## 2023-03-01 DIAGNOSIS — N920 Excessive and frequent menstruation with regular cycle: Secondary | ICD-10-CM

## 2023-03-01 DIAGNOSIS — F32A Depression, unspecified: Secondary | ICD-10-CM

## 2023-03-01 MED ORDER — AMPHETAMINE-DEXTROAMPHETAMINE 20 MG PO TABS: 20.0000 mg | ORAL_TABLET | Freq: Every day | ORAL | 0 refills | Status: DC | PRN

## 2023-03-01 MED ORDER — HYDROXYZINE HCL 25 MG PO TABS: 12.5000 mg | ORAL_TABLET | Freq: Two times a day (BID) | ORAL | 0 refills | Status: DC | PRN

## 2023-03-01 MED ORDER — AMPHETAMINE-DEXTROAMPHETAMINE 30 MG PO TABS: 30.0000 mg | ORAL_TABLET | Freq: Every morning | ORAL | 0 refills | Status: DC

## 2023-03-01 NOTE — Progress Notes (Signed)
 Subjective:     Patient ID: Amanda Wilson, female    DOB: October 03, 1990, 33 y.o.   MRN: [id number]  Chief Complaint  Patient presents with   Medical Management of Chronic Issues    Discuss taking over adderall and hydroxyzine  until able to get to a psychiatrist     HPI   History of Present Illness         Here requesting that I refill her ADHD and anxiety medications.   She was seeing Megan through [facility], online prescribing. She is looking for a new psychiatrist.   Denies any SI, alcohol or drug use and denies hx of addiction   States her medications changed due to drug shortage of Adderall XR. States she was taking  Adderall XR in the morning and an immediate release Adderall in the afternoon but with the shortage she was put on Adderall 30 mg in the morning and then 20 mg in the afternoon.  Some days she takes her morning Adderall around 9 am and then she does not the PM dose.   Feels like her current regimen calms her but still has issues with staying focused   With the XR dosing she was not feeling as calm but was able to better focus.   No sexual activity with males.  No chance of pregnancy   OB/GYN- Dr. Lilton at [facility]   Vitamin D  def-  Weekly vitamin D   Taking iron supplement.   Will see her OB/GYN regarding heavy menses   Health Maintenance Due  Topic Date Due   HIV Screening  Never done   Hepatitis C Screening  Never done   Cervical Cancer Screening (HPV/Pap Cotest)  Never done    Past Medical History:  Diagnosis Date   ADHD (attention deficit hyperactivity disorder)    Allergy     Anemia    Anxiety    Complication of anesthesia    said nerve block didn't work on finger   COVID-19    early February   Depression    Family history of adverse reaction to anesthesia    Father- slow to awaken also- father had part of lung remved for an Empyema.   Frequent headaches    migraines- maybe 1 once a month since starting to take Topmax on a  daily basis   Neuromuscular disorder (HCC)    nerve pain in right small finger after surgery   Pneumonia    as a  child    Past Surgical History:  Procedure Laterality Date   CHOLECYSTECTOMY N/A 01/11/2023   Procedure: LAPAROSCOPIC CHOLECYSTECTOMY WITH INTRAOPERATIVE CHOLANGIOGRAM;  Surgeon: Belinda Cough, MD;  Location: [facility] OR;  Service: General;  Laterality: N/A;  RNFA Assist   HARDWARE REMOVAL Right 05/05/2020   Procedure: RIGHT HAND HARDWARE REMOVAL AND SMALL FINGER METACARPOPHALANGEAL JOINT RELEASE;  Surgeon: Sebastian Lenis, MD;  Location: [facility] OR;  Service: Orthopedics;  Laterality: Right;   OPEN REDUCTION INTERNAL FIXATION (ORIF) PROXIMAL PHALANX Right 11/05/2019   Procedure: OPEN TREATMENT OF RIGHT RING FINGER FRACTURE AND 5TH METACARPAL FRACTURE;  Surgeon: Sebastian Lenis, MD;  Location: [facility] OR;  Service: Orthopedics;  Laterality: Right;  LENGTH OF SURGERY: 90 MIN  PRE OP BLOCK    Family History  Problem Relation Age of Onset   Cancer Mother        skin   Depression Mother    Anxiety disorder Mother    Arthritis Mother    Heart disease Father    Stroke Father  Diabetes Father    Atrial fibrillation Father    Depression Sister    Anxiety disorder Sister    Depression Maternal Aunt    Anxiety disorder Maternal Aunt    Dementia Maternal Grandfather    Atrial fibrillation Paternal Grandfather    Depression Cousin    Anxiety disorder Brother     Social History   Socioeconomic History   Marital status: Single    Spouse name: Not on file   Number of children: 0   Years of education: Not on file   Highest education level: Master's degree (e.g., MA, MS, MEng, MEd, MSW, MBA)  Occupational History   Not on file  Tobacco Use   Smoking status: Never   Smokeless tobacco: Never  Vaping Use   Vaping status: Never Used  Substance and Sexual Activity   Alcohol use: Not Currently   Drug use: Never   Sexual activity: Not Currently    Partners: Male    Comment: Nuvaring   Other Topics Concern   Not on file  Social History Narrative   Not on file   Social Drivers of Health   Financial Resource Strain: Low Risk  (02/25/2023)   Overall Financial Resource Strain (CARDIA)    Difficulty of Paying Living Expenses: Not very hard  Food Insecurity: No Food Insecurity (02/25/2023)   Hunger Vital Sign    Worried About Running Out of Food in the Last Year: Never true    Ran Out of Food in the Last Year: Never true  Transportation Needs: No Transportation Needs (02/25/2023)   PRAPARE - Administrator, Civil Service (Medical): No    Lack of Transportation (Non-Medical): No  Physical Activity: Unknown (02/25/2023)   Exercise Vital Sign    Days of Exercise per Week: 0 days    Minutes of Exercise per Session: Not on file  Stress: Stress Concern Present (02/25/2023)   [employer] of Occupational Health - Occupational Stress Questionnaire    Feeling of Stress : Very much  Social Connections: Socially Isolated (02/25/2023)   Social Connection and Isolation Panel [NHANES]    Frequency of Communication with Friends and Family: More than three times a week    Frequency of Social Gatherings with Friends and Family: Once a week    Attends Religious Services: Never    Database Administrator or Organizations: No    Attends Engineer, Structural: Not on file    Marital Status: Never married  Intimate Partner Violence: Not At Risk (12/10/2017)   Humiliation, Afraid, Rape, and Kick questionnaire    Fear of Current or Ex-Partner: No    Emotionally Abused: No    Physically Abused: No    Sexually Abused: No    Outpatient Medications Prior to Visit  Medication Sig Dispense Refill   Dermatological Products, Misc. (DERMAREST ROSACEA EX) Apply 1 Application topically in the morning and at bedtime. Triple rosacea cream (azelaic acid/ivermectin/metronidazole)     Iron-Vitamins (GERITOL) LIQD Take 1 Dose by mouth at bedtime.     MELATONIN PO Take 5 mg by  mouth at bedtime.     oxyCODONE  (OXY IR/ROXICODONE ) 5 MG immediate release tablet Take 1 tablet (5 mg total) by mouth every 6 (six) hours as needed for severe pain (pain score 7-10). 20 tablet 0   polyethylene glycol (MIRALAX / GLYCOLAX) 17 g packet Take 17 g by mouth in the morning.     Vitamin D , Ergocalciferol , (DRISDOL ) 1.25 MG (50000 UNIT) CAPS capsule Take  1 capsule (50,000 Units total) by mouth every 7 (seven) days. (Patient taking differently: Take 50,000 Units by mouth every Monday.) 12 capsule 0   amphetamine -dextroamphetamine  (ADDERALL) 20 MG tablet Take 20 mg by mouth daily as needed (in the afternoon if needed for focus/attentiveness).     amphetamine -dextroamphetamine  (ADDERALL) 30 MG tablet Take 30 mg by mouth every morning.     hydrOXYzine  (ATARAX /VISTARIL ) 25 MG tablet Take 12.5-50 mg by mouth 2 (two) times daily as needed for anxiety.     No facility-administered medications prior to visit.    Allergies  Allergen Reactions   Duricef [Cefadroxil]     Skin sloughing   Latex Rash    Review of Systems  Constitutional:  Positive for malaise/fatigue. Negative for chills and fever.  Respiratory:  Negative for shortness of breath.   Cardiovascular:  Negative for chest pain, palpitations and leg swelling.  Gastrointestinal:  Negative for abdominal pain, constipation, diarrhea, nausea and vomiting.  Genitourinary:  Negative for dysuria, frequency and urgency.  Neurological:  Negative for dizziness and focal weakness.  Psychiatric/Behavioral:  Negative for depression, substance abuse and suicidal ideas. The patient is not nervous/anxious.        Objective:    Physical Exam Constitutional:      General: She is not in acute distress.    Appearance: She is not ill-appearing.  Eyes:     Extraocular Movements: Extraocular movements intact.     Conjunctiva/sclera: Conjunctivae normal.  Cardiovascular:     Rate and Rhythm: Normal rate.  Pulmonary:     Effort: Pulmonary effort  is normal.  Musculoskeletal:     Cervical back: Normal range of motion and neck supple.  Skin:    General: Skin is warm and dry.  Neurological:     General: No focal deficit present.     Mental Status: She is alert and oriented to person, place, and time.     Motor: No weakness.     Coordination: Coordination normal.     Gait: Gait normal.  Psychiatric:        Mood and Affect: Mood normal.        Behavior: Behavior normal.        Thought Content: Thought content normal.      BP 122/84 (BP Location: Left Arm, Patient Position: Sitting, Cuff Size: Large)   Pulse (!) 105   Temp 97.6 F (36.4 C) (Temporal)   Ht 5' 4 (1.626 m)   Wt (!) 317 lb (143.8 kg)   SpO2 99%   BMI 54.41 kg/m  Wt Readings from Last 3 Encounters:  03/01/23 (!) 317 lb (143.8 kg)  01/11/23 (!) 319 lb (144.7 kg)  01/07/23 (!) 319 lb 6.4 oz (144.9 kg)       Assessment & Plan:   Problem List Items Addressed This Visit     ADHD   Relevant Medications   amphetamine -dextroamphetamine  (ADDERALL) 20 MG tablet   amphetamine -dextroamphetamine  (ADDERALL) 30 MG tablet   Other Relevant Orders   Ambulatory referral to Psychiatry   Anxiety and depression - Primary   Relevant Medications   hydrOXYzine  (ATARAX ) 25 MG tablet   Other Relevant Orders   Ambulatory referral to Psychiatry   Iron deficiency anemia due to chronic blood loss   Menorrhagia with regular cycle   Vitamin D  deficiency   Other Visit Diagnoses       Need for diphtheria-tetanus-pertussis (Tdap) vaccine       Relevant Orders   Tdap vaccine greater than or equal  to 7yo IM (Completed)      Declines labs today. She will follow up with OB/GYN. Recommend taking iron and vitamin D  due to deficiencies.  Refilled Adderall as she has been taking it although she does not feel that her current regimen is ideal. She was doing better on Adderall XR but the drug shortage forced her to change her regimen.  Her psychiatrist who was treating her ADHD and  anxiety left and she needs a new one.  Referral placed.  Tdap given and counseling done.    I have changed Amanda Wilson's hydrOXYzine , amphetamine -dextroamphetamine , and amphetamine -dextroamphetamine . I am also having her maintain her Vitamin D  (Ergocalciferol ), Geritol, polyethylene glycol, MELATONIN PO, (Dermatological Products, Misc. (DERMAREST ROSACEA EX)), and oxyCODONE .  Meds ordered this encounter  Medications   hydrOXYzine  (ATARAX ) 25 MG tablet    Sig: Take 0.5-2 tablets (12.5-50 mg total) by mouth 2 (two) times daily as needed for anxiety.    Dispense:  30 tablet    Refill:  0    Supervising Provider:   ROLLENE NORRIS A [[id number]]   amphetamine -dextroamphetamine  (ADDERALL) 20 MG tablet    Sig: Take 1 tablet (20 mg total) by mouth daily as needed (in the afternoon if needed for focus/attentiveness).    Dispense:  30 tablet    Refill:  0    Supervising Provider:   ROLLENE NORRIS A [[id number]]   amphetamine -dextroamphetamine  (ADDERALL) 30 MG tablet    Sig: Take 1 tablet by mouth every morning.    Dispense:  30 tablet    Refill:  0    Supervising Provider:   ROLLENE NORRIS A [[id number]]

## 2023-03-01 NOTE — Patient Instructions (Signed)
Please follow up with your OB/GYN at your earliest convenience.   Continue your current medications including iron and vitamin D supplements.   You should hear from a psychiatry office soon. Let me know if you have not heard in 2 weeks.

## 2023-03-03 ENCOUNTER — Other Ambulatory Visit: Payer: Self-pay | Admitting: Family Medicine

## 2023-03-03 DIAGNOSIS — F909 Attention-deficit hyperactivity disorder, unspecified type: Secondary | ICD-10-CM

## 2023-03-03 MED ORDER — AMPHETAMINE-DEXTROAMPHETAMINE 20 MG PO TABS: 20.0000 mg | ORAL_TABLET | Freq: Every day | ORAL | 0 refills | Status: DC | PRN

## 2023-03-03 MED ORDER — AMPHETAMINE-DEXTROAMPHETAMINE 30 MG PO TABS: 30.0000 mg | ORAL_TABLET | Freq: Every morning | ORAL | 0 refills | Status: DC

## 2023-03-03 NOTE — Telephone Encounter (Signed)
 Pt needs adderall sent to [facility] [address] as pharmacy is out of stock. I added the pharmacy to her list

## 2023-03-04 DIAGNOSIS — F413 Other mixed anxiety disorders: Secondary | ICD-10-CM | POA: Diagnosis not present

## 2023-03-09 ENCOUNTER — Telehealth: Payer: BC Managed Care – PPO

## 2023-03-09 DIAGNOSIS — B9689 Other specified bacterial agents as the cause of diseases classified elsewhere: Secondary | ICD-10-CM

## 2023-03-09 DIAGNOSIS — J019 Acute sinusitis, unspecified: Secondary | ICD-10-CM

## 2023-03-10 MED ORDER — DOXYCYCLINE HYCLATE 100 MG PO TABS: 100.0000 mg | ORAL_TABLET | Freq: Two times a day (BID) | ORAL | 0 refills | Status: DC

## 2023-03-10 MED ORDER — BENZONATATE 100 MG PO CAPS
100.0000 mg | ORAL_CAPSULE | Freq: Three times a day (TID) | ORAL | 0 refills | Status: DC | PRN
Start: 2023-03-10 — End: 2023-04-29

## 2023-03-10 NOTE — Progress Notes (Signed)
I have spent 5 minutes in review of e-visit questionnaire, review and updating patient chart, medical decision making and response to patient.   Piedad Climes, PA-C

## 2023-03-10 NOTE — Progress Notes (Signed)
E-Visit for Sinus Problems  We are sorry that you are not feeling well.  Here is how we plan to help!  Based on what you have shared with me it looks like you have sinusitis.  Sinusitis is inflammation and infection in the sinus cavities of the head.  Based on your presentation I believe you most likely have Acute Bacterial Sinusitis.  This is an infection caused by bacteria and is treated with antibiotics. I have prescribed Doxycycline 100mg  by mouth twice a day for 10 days. I have also sent in a prescription cough medication to take as directed. This is safe to use with your OTC cough medications.    You may use an oral decongestant such as Mucinex D or if you have glaucoma or high blood pressure use plain Mucinex. Saline nasal spray help and can safely be used as often as needed for congestion.  If you develop worsening sinus pain, fever or notice severe headache and vision changes, or if symptoms are not better after completion of antibiotic, please schedule an appointment with a health care provider.    Sinus infections are not as easily transmitted as other respiratory infection, however we still recommend that you avoid close contact with loved ones, especially the very young and elderly.  Remember to wash your hands thoroughly throughout the day as this is the number one way to prevent the spread of infection!  Home Care: Only take medications as instructed by your medical team. Complete the entire course of an antibiotic. Do not take these medications with alcohol. A steam or ultrasonic humidifier can help congestion.  You can place a towel over your head and breathe in the steam from hot water coming from a faucet. Avoid close contacts especially the very young and the elderly. Cover your mouth when you cough or sneeze. Always remember to wash your hands.  Get Help Right Away If: You develop worsening fever or sinus pain. You develop a severe head ache or visual changes. Your symptoms  persist after you have completed your treatment plan.  Make sure you Understand these instructions. Will watch your condition. Will get help right away if you are not doing well or get worse.  Thank you for choosing an e-visit.  Your e-visit answers were reviewed by a board certified advanced clinical practitioner to complete your personal care plan. Depending upon the condition, your plan could have included both over the counter or prescription medications.  Please review your pharmacy choice. Make sure the pharmacy is open so you can pick up prescription now. If there is a problem, you may contact your provider through [employer] and have the prescription routed to another pharmacy.  Your safety is important to [place]. If you have drug allergies check your prescription carefully.   For the next 24 hours you can use MyChart to ask questions about today's visit, request a non-urgent call back, or ask for a work or school excuse. You will get an email in the next two days asking about your experience. I hope that your e-visit has been valuable and will speed your recovery.

## 2023-04-28 ENCOUNTER — Other Ambulatory Visit: Payer: Self-pay

## 2023-04-28 ENCOUNTER — Ambulatory Visit: Admitting: Allergy & Immunology

## 2023-04-28 ENCOUNTER — Encounter: Payer: Self-pay | Admitting: Allergy & Immunology

## 2023-04-28 VITALS — BP 122/74 | HR 110 | Temp 98.1°F | Resp 16 | Ht 63.58 in | Wt 314.0 lb

## 2023-04-28 DIAGNOSIS — B999 Unspecified infectious disease: Secondary | ICD-10-CM | POA: Diagnosis not present

## 2023-04-28 DIAGNOSIS — L239 Allergic contact dermatitis, unspecified cause: Secondary | ICD-10-CM | POA: Diagnosis not present

## 2023-04-28 DIAGNOSIS — J310 Chronic rhinitis: Secondary | ICD-10-CM

## 2023-04-28 DIAGNOSIS — L719 Rosacea, unspecified: Secondary | ICD-10-CM | POA: Diagnosis not present

## 2023-04-28 MED ORDER — TRIAMCINOLONE ACETONIDE 0.025 % EX OINT: 1.0000 | TOPICAL_OINTMENT | Freq: Two times a day (BID) | CUTANEOUS | 3 refills | Status: AC

## 2023-04-28 NOTE — Progress Notes (Unsigned)
 NEW PATIENT  Date of Service/Encounter:  04/28/23  Consult requested by: Amanda Shackleton, NP-C   Assessment:   No diagnosis found.  [place]ian descent  Plan/Recommendations:   Assessment and Plan Assessment & Plan      There are no Patient Instructions on file for this visit.   {Blank single:19197::"This note in its entirety was forwarded to the Provider who requested this consultation."}  Subjective:   Amanda Wilson is a 33 y.o. female presenting today for evaluation of  Chief Complaint  Patient presents with   Allergies   Nasal Congestion    Amanda Wilson has a history of the following: Patient Active Problem List   Diagnosis Date Noted   Iron deficiency anemia due to chronic blood loss 12/13/2022   Vitamin D deficiency 12/13/2022   Severe obesity (BMI >= 40) (HCC) 12/13/2022   Menorrhagia with regular cycle 12/13/2022   ADHD 05/11/2022   Dysmenorrhea 06/24/2016   Anxiety and depression 06/24/2016   Hirsutism 07/17/2013   Obesity 02/06/2013   Family history of PCOS 01/30/2013   Hair thinning 01/30/2013   Acne 05/12/2012    History obtained from: chart review and {Persons; PED relatives w/patient:19415::"patient"}.  Discussed the use of AI scribe software for clinical note transcription with the patient and/or guardian, who gave verbal consent to proceed.  Amanda Wilson was referred by Amanda Shackleton, NP-C.     Amanda Wilson is a 33 y.o. female presenting for {Blank single:19197::"a food challenge","a drug challenge","skin testing","a sick visit","an evaluation of ***","a follow up visit"}.  Discussed the use of AI scribe software for clinical note transcription with the patient, who gave verbal consent to proceed.  History of Present Illness      Asthma/Respiratory Symptom History: ***  Allergic Rhinitis Symptom History: ***  Food Allergy Symptom History: ***  Skin Symptom History: ***  GERD Symptom History: ***  Infection  Symptom History: ***  ***Otherwise, there is no history of other atopic diseases, including {Blank multiple:19196:o:"asthma","food allergies","drug allergies","environmental allergies","stinging insect allergies","eczema","urticaria","contact dermatitis"}. There is no significant infectious history. ***Vaccinations are up to date.    Past Medical History: Patient Active Problem List   Diagnosis Date Noted   Iron deficiency anemia due to chronic blood loss 12/13/2022   Vitamin D deficiency 12/13/2022   Severe obesity (BMI >= 40) (HCC) 12/13/2022   Menorrhagia with regular cycle 12/13/2022   ADHD 05/11/2022   Dysmenorrhea 06/24/2016   Anxiety and depression 06/24/2016   Hirsutism 07/17/2013   Obesity 02/06/2013   Family history of PCOS 01/30/2013   Hair thinning 01/30/2013   Acne 05/12/2012    Medication List:  Allergies as of 04/28/2023       Reactions   Duricef [cefadroxil]    Skin sloughing   Latex Rash        Medication List        Accurate as of April 28, 2023  1:47 PM. If you have any questions, ask your nurse or doctor.          amphetamine-dextroamphetamine 20 MG tablet Commonly known as: ADDERALL Take 1 tablet (20 mg total) by mouth daily as needed (in the afternoon if needed for focus/attentiveness).   amphetamine-dextroamphetamine 30 MG tablet Commonly known as: ADDERALL Take 1 tablet by mouth every morning.   benzonatate 100 MG capsule Commonly known as: TESSALON Take 1 capsule (100 mg total) by mouth 3 (three) times daily as needed for cough.   DERMAREST ROSACEA EX Apply 1 Application topically in the morning and at  bedtime. Triple rosacea cream (azelaic acid/ivermectin/metronidazole)   doxycycline 100 MG tablet Commonly known as: VIBRA-TABS Take 1 tablet (100 mg total) by mouth 2 (two) times daily.   Geritol Liqd Take 1 Dose by mouth at bedtime.   hydrOXYzine 25 MG tablet Commonly known as: ATARAX Take 0.5-2 tablets (12.5-50 mg total) by  mouth 2 (two) times daily as needed for anxiety.   MELATONIN PO Take 5 mg by mouth at bedtime.   oxyCODONE 5 MG immediate release tablet Commonly known as: Oxy IR/ROXICODONE Take 1 tablet (5 mg total) by mouth every 6 (six) hours as needed for severe pain (pain score 7-10).   polyethylene glycol 17 g packet Commonly known as: MIRALAX / GLYCOLAX Take 17 g by mouth in the morning.   Vitamin D (Ergocalciferol) 1.25 MG (50000 UNIT) Caps capsule Commonly known as: DRISDOL Take 1 capsule (50,000 Units total) by mouth every 7 (seven) days. What changed: when to take this        Birth History: {Blank single:19197::"non-contributory","born premature and spent time in the NICU","born at term without complications"}  Developmental History: Amanda Wilson has met all milestones on time. She has required no {Blank multiple:19196:a:"speech therapy","occupational therapy","physical therapy"}. ***non-contributory  Past Surgical History: Past Surgical History:  Procedure Laterality Date   CHOLECYSTECTOMY N/A 01/11/2023   Procedure: LAPAROSCOPIC CHOLECYSTECTOMY WITH INTRAOPERATIVE CHOLANGIOGRAM;  Surgeon: Amanda Rudd, MD;  Location: [facility] OR;  Service: General;  Laterality: N/A;  RNFA Assist   HARDWARE REMOVAL Right 05/05/2020   Procedure: RIGHT HAND HARDWARE REMOVAL AND SMALL FINGER METACARPOPHALANGEAL JOINT RELEASE;  Surgeon: Amanda Hook, MD;  Location: [facility] OR;  Service: Orthopedics;  Laterality: Right;   OPEN REDUCTION INTERNAL FIXATION (ORIF) PROXIMAL PHALANX Right 11/05/2019   Procedure: OPEN TREATMENT OF RIGHT RING FINGER FRACTURE AND 5TH METACARPAL FRACTURE;  Surgeon: Amanda Hook, MD;  Location: [facility] OR;  Service: Orthopedics;  Laterality: Right;  LENGTH OF SURGERY: 90 MIN  PRE OP BLOCK     Family History: Family History  Problem Relation Age of Onset   Allergic rhinitis Mother    Cancer Mother        skin   Depression Mother    Anxiety disorder Mother    Arthritis Mother     Allergic rhinitis Father    Heart disease Father    Stroke Father    Diabetes Father    Atrial fibrillation Father    Allergic rhinitis Sister    Depression Sister    Anxiety disorder Sister    Urticaria Brother    Eczema Brother    Asthma Brother    Allergic rhinitis Brother    Anxiety disorder Brother    Depression Maternal Aunt    Anxiety disorder Maternal Aunt    Dementia Maternal Grandfather    Atrial fibrillation Paternal Grandfather    Depression Cousin    Angioedema Neg Hx      Social History: Laurabelle lives at home with ***.    Review of systems otherwise negative other than that mentioned in the HPI.    Objective:   Blood pressure 122/74, pulse (!) 110, temperature 98.1 F (36.7 C), temperature source Temporal, resp. rate 16, height 5' 3.58" (1.615 m), weight (!) 314 lb (142.4 kg), SpO2 98%. Body mass index is 54.61 kg/m.     Physical Exam   Diagnostic studies: {Blank single:19197::"none","deferred due to recent antihistamine use","deferred due to insurance stipulations that require a separate visit for testing","labs sent instead"," "}  Spirometry: {Blank single:19197::"results normal (FEV1: ***%, FVC: ***%, FEV1/FVC: ***%)","  results abnormal (FEV1: ***%, FVC: ***%, FEV1/FVC: ***%)"}.    {Blank single:19197::"Spirometry consistent with mild obstructive disease","Spirometry consistent with moderate obstructive disease","Spirometry consistent with severe obstructive disease","Spirometry consistent with possible restrictive disease","Spirometry consistent with mixed obstructive and restrictive disease","Spirometry uninterpretable due to technique","Spirometry consistent with normal pattern"}. {Blank single:19197::"Albuterol/Atrovent nebulizer","Xopenex/Atrovent nebulizer","Albuterol nebulizer","Albuterol four puffs via MDI","Xopenex four puffs via MDI"} treatment given in clinic with {Blank single:19197::"significant improvement in FEV1 per ATS  criteria","significant improvement in FVC per ATS criteria","significant improvement in FEV1 and FVC per ATS criteria","improvement in FEV1, but not significant per ATS criteria","improvement in FVC, but not significant per ATS criteria","improvement in FEV1 and FVC, but not significant per ATS criteria","no improvement"}.  Allergy Studies: {Blank single:19197::"none","deferred due to recent antihistamine use","deferred due to insurance stipulations that require a separate visit for testing","labs sent instead"," "}    {Blank single:19197::"Allergy testing results were read and interpreted by myself, documented by clinical staff."," "}         Malachi Bonds, MD [facility]

## 2023-04-28 NOTE — Patient Instructions (Addendum)
 1. Chronic rhinitis - Because of insurance stipulations, we cannot do skin testing on the same day as your first visit. - We are all working to fight this, but for now we need to do two separate visits.  - We will know more after we do testing at the next visit.  - The skin testing visit can be squeezed in at your convenience.  - Then we can make a more full plan to address all of your symptoms. - Be sure to stop your antihistamines for 3 days before this appointment.   2. Allergic contact dermatitis - We can consider doing patch testing in the future (metals, chemicals, etc).  - Copy of testing options below. - Add on triamcinolone 0.025% ointment twice daily as needed for the ear.   3. Recurrent infections - We will consider doing an immune workup if the skin testing is not revealing. - This involves blood work.   4. Return in about 1 week (around 05/05/2023). You can have the follow up appointment with Dr. Dellis Anes or a Nurse Practicioner (our Nurse Practitioners are excellent and always have Physician oversight!).    Please inform us of any Emergency Department visits, hospitalizations, or changes in symptoms. Call us before going to the ED for breathing or allergy symptoms since we might be able to fit you in for a sick visit. Feel free to contact us anytime with any questions, problems, or concerns.  It was a pleasure to meet you today!   Check out Lawerance Sabal: [email or web address]   Websites that have reliable patient information: 1. American Academy of Asthma, Allergy, and Immunology: www.aaaai.org 2. Food Allergy Research and Education (FARE): foodallergy.org 3. Mothers of Asthmatics: http://www.asthmacommunitynetwork.org 4. American College of Allergy, Asthma, and Immunology: www.acaai.org      "Like" [place] on Facebook and Instagram for our latest updates!      A healthy democracy works best when [employer] participate! Make sure you  are registered to vote! If you have moved or changed any of your contact information, you will need to get this updated before voting! Scan the QR codes below to learn more!

## 2023-04-29 ENCOUNTER — Encounter: Payer: Self-pay | Admitting: Allergy & Immunology

## 2023-04-29 DIAGNOSIS — F413 Other mixed anxiety disorders: Secondary | ICD-10-CM | POA: Diagnosis not present

## 2023-05-02 DIAGNOSIS — F419 Anxiety disorder, unspecified: Secondary | ICD-10-CM | POA: Diagnosis not present

## 2023-05-02 DIAGNOSIS — Z5181 Encounter for therapeutic drug level monitoring: Secondary | ICD-10-CM | POA: Diagnosis not present

## 2023-05-02 DIAGNOSIS — F909 Attention-deficit hyperactivity disorder, unspecified type: Secondary | ICD-10-CM | POA: Diagnosis not present

## 2023-05-06 ENCOUNTER — Ambulatory Visit: Admitting: Family Medicine

## 2023-05-06 ENCOUNTER — Encounter: Payer: Self-pay | Admitting: Family Medicine

## 2023-05-06 DIAGNOSIS — J302 Other seasonal allergic rhinitis: Secondary | ICD-10-CM

## 2023-05-06 DIAGNOSIS — J3089 Other allergic rhinitis: Secondary | ICD-10-CM | POA: Diagnosis not present

## 2023-05-06 DIAGNOSIS — B999 Unspecified infectious disease: Secondary | ICD-10-CM

## 2023-05-06 DIAGNOSIS — L239 Allergic contact dermatitis, unspecified cause: Secondary | ICD-10-CM

## 2023-05-06 MED ORDER — CARBINOXAMINE MALEATE 4 MG PO TABS: ORAL_TABLET | ORAL | 5 refills | Status: DC

## 2023-05-06 MED ORDER — RYALTRIS 665-25 MCG/ACT NA SUSP: 2.0000 | Freq: Two times a day (BID) | NASAL | 5 refills | Status: AC | PRN

## 2023-05-06 NOTE — Patient Instructions (Addendum)
 Allergic rhinitis Your skin testing at today's visit was positive to grass pollen, weed pollen, tree pollen, mold, dust mite, and cat hair Avoidance measures are listed below Begin carbinoxamine 4 mg tablets. Take 1-2 tablets twice a day if needed for a runny nose Begin Ryaltris 1-2 sprays in each nostril up to twice a day if needed for nasal symptoms.  In the right nostril, point the applicator out toward the right ear. In the left nostril, point the applicator out toward the left ear Consider saline nasal rinses as needed for nasal symptoms. Use this before any medicated nasal sprays for best result If your symptoms are not well-controlled with the treatment plan as listed above, consider allergen immunotherapy.  Allergic contact dermatitis We can consider doing patch testing in the future (metals, chemicals, etc).  Continue triamcinolone 0.025% ointment twice daily as needed for the ear.   Recurrent infections Ryaltris I will dress Can consider doing an immune workup if the future  Call the clinic if this treatment plan is not working well for you  Follow up in 3 months or sooner if needed.  Reducing Pollen Exposure The American Academy of Allergy, Asthma and Immunology suggests the following steps to reduce your exposure to pollen during allergy seasons. Do not hang sheets or clothing out to dry; pollen may collect on these items. Do not mow lawns or spend time around freshly cut grass; mowing stirs up pollen. Keep windows closed at night.  Keep car windows closed while driving. Minimize morning activities outdoors, a time when pollen counts are usually at their highest. Stay indoors as much as possible when pollen counts or humidity is high and on windy days when pollen tends to remain in the air longer. Use air conditioning when possible.  Many air conditioners have filters that trap the pollen spores. Use a HEPA room air filter to remove pollen form the indoor air you  breathe.  Control of Mold Allergen Mold and fungi can grow on a variety of surfaces provided certain temperature and moisture conditions exist.  Outdoor molds grow on plants, decaying vegetation and soil.  The major outdoor mold, Alternaria and Cladosporium, are found in very high numbers during hot and dry conditions.  Generally, a late Summer - Fall peak is seen for common outdoor fungal spores.  Rain will temporarily lower outdoor mold spore count, but counts rise rapidly when the rainy period ends.  The most important indoor molds are Aspergillus and Penicillium.  Dark, humid and poorly ventilated basements are ideal sites for mold growth.  The next most common sites of mold growth are the bathroom and the kitchen.  Outdoor [employer] Use air conditioning and keep windows closed Avoid exposure to decaying vegetation. Avoid leaf raking. Avoid grain handling. Consider wearing a face mask if working in moldy areas.  Indoor Mold Control Maintain humidity below 50%. Clean washable surfaces with 5% bleach solution. Remove sources e.g. Contaminated carpets.  Control of Dog or Cat Allergen Avoidance is the best way to manage a dog or cat allergy. If you have a dog or cat and are allergic to dog or cats, consider removing the dog or cat from the home. If you have a dog or cat but don't want to find it a new home, or if your family wants a pet even though someone in the household is allergic, here are some strategies that may help keep symptoms at bay:  Keep the pet out of your bedroom and restrict it to  only a few rooms. Be advised that keeping the dog or cat in only one room will not limit the allergens to that room. Don't pet, hug or kiss the dog or cat; if you do, wash your hands with soap and water. High-efficiency particulate air (HEPA) cleaners run continuously in a bedroom or living room can reduce allergen levels over time. Regular use of a high-efficiency vacuum cleaner or a central  vacuum can reduce allergen levels. Giving your dog or cat a bath at least once a week can reduce airborne allergen.   Control of Dust Mite Allergen Dust mites play a major role in allergic asthma and rhinitis. They occur in environments with high humidity wherever human skin is found. Dust mites absorb humidity from the atmosphere (ie, they do not drink) and feed on organic matter (including shed human and animal skin). Dust mites are a microscopic type of insect that you cannot see with the naked eye. High levels of dust mites have been detected from mattresses, pillows, carpets, upholstered furniture, bed covers, clothes, soft toys and any woven material. The principal allergen of the dust mite is found in its feces. A gram of dust may contain 1,000 mites and 250,000 fecal particles. Mite antigen is easily measured in the air during house cleaning activities. Dust mites do not bite and do not cause harm to humans, other than by triggering allergies/asthma.  Ways to decrease your exposure to dust mites in your home:  1. Encase mattresses, box springs and pillows with a mite-impermeable barrier or cover  2. Wash sheets, blankets and drapes weekly in hot water (130 F) with detergent and dry them in a dryer on the hot setting.  3. Have the room cleaned frequently with a vacuum cleaner and a damp dust-mop. For carpeting or rugs, vacuuming with a vacuum cleaner equipped with a high-efficiency particulate air (HEPA) filter. The dust mite allergic individual should not be in a room which is being cleaned and should wait 1 hour after cleaning before going into the room.  4. Do not sleep on upholstered furniture (eg, couches).  5. If possible removing carpeting, upholstered furniture and drapery from the home is ideal. Horizontal blinds should be eliminated in the rooms where the person spends the most time (bedroom, study, television room). Washable vinyl, roller-type shades are optimal.  6. Remove all  non-washable stuffed toys from the bedroom. Wash stuffed toys weekly like sheets and blankets above.  7. Reduce indoor humidity to less than 50%. Inexpensive humidity monitors can be purchased at most hardware stores. Do not use a humidifier as can make the problem worse and are not recommended.

## 2023-05-06 NOTE — Progress Notes (Signed)
   [address] [place] [address] Dept: [phone]  FOLLOW UP NOTE  Patient ID: Amanda Wilson, female    DOB: Jun 04, 1990  Age: 33 y.o. MRN: [id number] Date of Office Visit: 05/06/2023  Assessment  Chief Complaint: Allergy Testing  HPI Amanda Wilson is a 33 year old female who presents to the clinic for follow-up visit.  She was last seen in this clinic on 4/3/202 the patient by Dr. Dellis Anes for evaluation of chronic rhinitis, allergic contact dermatitis, and recurrent infection.    At today's visit, she reports that she is feeling well overall and has not had any antihistamines over the last 3 days. Her current medications are listed in the chart.    Drug Allergies:  Allergies  Allergen Reactions   Duricef [Cefadroxil]     Skin sloughing   Latex Rash    Diagnostics: Percutaneous environmental skin testing was positive to grass pollen, one weed pollen, one tree pollen, and mold with adequate controls  Intradermal skin testing was positive to mold mix 2 dust mite and cat hair with an adequate control  Assessment and Plan: 1. Seasonal and perennial allergic rhinitis   2. Allergic contact dermatitis, unspecified trigger   3. Recurrent infections     Meds ordered this encounter  Medications   Carbinoxamine Maleate 4 MG TABS    Sig: Take 1 to 2 tablets twice a day if needed for runny nose or itch    Dispense:  120 tablet    Refill:  5   Olopatadine-Mometasone (RYALTRIS) 665-25 MCG/ACT SUSP    Sig: Place 2 sprays into the nose 2 (two) times daily as needed.    Dispense:  29 g    Refill:  5    Patient Instructions  Allergic rhinitis Your skin testing at today's visit was positive to grass pollen, weed pollen, tree pollen, mold, dust mite, and cat hair Avoidance measures are listed below Begin carbinoxamine 4 mg tablets. Take 1-2 tablets twice a day if needed for a runny nose Begin Ryaltris 1-2 sprays in each nostril up to twice a day if needed for nasal  symptoms.  In the right nostril, point the applicator out toward the right ear. In the left nostril, point the applicator out toward the left ear Consider saline nasal rinses as needed for nasal symptoms. Use this before any medicated nasal sprays for best result If your symptoms are not well-controlled with the treatment plan as listed above, consider allergen immunotherapy.  Allergic contact dermatitis We can consider doing patch testing in the future (metals, chemicals, etc).  Continue triamcinolone 0.025% ointment twice daily as needed for the ear.   Recurrent infections Can consider doing an immune workup if the future  Call the clinic if this treatment plan is not working well for you  Follow up in 3 months or sooner if needed.   Return in about 3 months (around 08/05/2023).    Thank you for the opportunity to care for this patient.  Please do not hesitate to contact me with questions.  Thermon Leyland, FNP [facility]

## 2023-05-27 DIAGNOSIS — F413 Other mixed anxiety disorders: Secondary | ICD-10-CM | POA: Diagnosis not present

## 2023-06-03 ENCOUNTER — Ambulatory Visit: Payer: BC Managed Care – PPO | Admitting: Family Medicine

## 2023-06-03 DIAGNOSIS — F419 Anxiety disorder, unspecified: Secondary | ICD-10-CM | POA: Diagnosis not present

## 2023-06-03 DIAGNOSIS — F909 Attention-deficit hyperactivity disorder, unspecified type: Secondary | ICD-10-CM | POA: Diagnosis not present

## 2023-06-10 ENCOUNTER — Encounter: Payer: Self-pay | Admitting: Family Medicine

## 2023-06-10 ENCOUNTER — Ambulatory Visit: Admitting: Family Medicine

## 2023-06-10 VITALS — BP 122/84 | HR 81 | Temp 97.6°F | Ht 64.0 in | Wt 309.0 lb

## 2023-06-10 DIAGNOSIS — E559 Vitamin D deficiency, unspecified: Secondary | ICD-10-CM

## 2023-06-10 DIAGNOSIS — Z79899 Other long term (current) drug therapy: Secondary | ICD-10-CM

## 2023-06-10 DIAGNOSIS — R631 Polydipsia: Secondary | ICD-10-CM

## 2023-06-10 DIAGNOSIS — R350 Frequency of micturition: Secondary | ICD-10-CM | POA: Diagnosis not present

## 2023-06-10 DIAGNOSIS — R110 Nausea: Secondary | ICD-10-CM

## 2023-06-10 DIAGNOSIS — R197 Diarrhea, unspecified: Secondary | ICD-10-CM | POA: Diagnosis not present

## 2023-06-10 DIAGNOSIS — R140 Abdominal distension (gaseous): Secondary | ICD-10-CM | POA: Diagnosis not present

## 2023-06-10 DIAGNOSIS — D500 Iron deficiency anemia secondary to blood loss (chronic): Secondary | ICD-10-CM | POA: Diagnosis not present

## 2023-06-10 DIAGNOSIS — F418 Other specified anxiety disorders: Secondary | ICD-10-CM

## 2023-06-10 DIAGNOSIS — F413 Other mixed anxiety disorders: Secondary | ICD-10-CM | POA: Diagnosis not present

## 2023-06-10 LAB — URINALYSIS WITH CULTURE, IF INDICATED
Bilirubin Urine: NEGATIVE
Hgb urine dipstick: NEGATIVE
Ketones, ur: NEGATIVE
Leukocytes,Ua: NEGATIVE
Nitrite: NEGATIVE
Specific Gravity, Urine: 1.025 (ref 1.000–1.030)
Total Protein, Urine: NEGATIVE
Urine Glucose: NEGATIVE
Urobilinogen, UA: 0.2 (ref 0.0–1.0)
pH: 6 (ref 5.0–8.0)

## 2023-06-10 LAB — CBC WITH DIFFERENTIAL/PLATELET
Basophils Absolute: 0 10*3/uL (ref 0.0–0.1)
Basophils Relative: 0.5 % (ref 0.0–3.0)
Eosinophils Absolute: 0.1 10*3/uL (ref 0.0–0.7)
Eosinophils Relative: 0.8 % (ref 0.0–5.0)
HCT: 38.2 % (ref 36.0–46.0)
Hemoglobin: 12 g/dL (ref 12.0–15.0)
Lymphocytes Relative: 25.8 % (ref 12.0–46.0)
Lymphs Abs: 2.2 10*3/uL (ref 0.7–4.0)
MCHC: 31.5 g/dL (ref 30.0–36.0)
MCV: 75.1 fl — ABNORMAL LOW (ref 78.0–100.0)
Monocytes Absolute: 0.5 10*3/uL (ref 0.1–1.0)
Monocytes Relative: 5.6 % (ref 3.0–12.0)
Neutro Abs: 5.9 10*3/uL (ref 1.4–7.7)
Neutrophils Relative %: 67.3 % (ref 43.0–77.0)
Platelets: 322 10*3/uL (ref 150.0–400.0)
RBC: 5.09 Mil/uL (ref 3.87–5.11)
RDW: 17.5 % — ABNORMAL HIGH (ref 11.5–15.5)
WBC: 8.7 10*3/uL (ref 4.0–10.5)

## 2023-06-10 LAB — COMPREHENSIVE METABOLIC PANEL WITH GFR
ALT: 7 U/L (ref 0–35)
AST: 9 U/L (ref 0–37)
Albumin: 3.8 g/dL (ref 3.5–5.2)
Alkaline Phosphatase: 68 U/L (ref 39–117)
BUN: 11 mg/dL (ref 6–23)
CO2: 27 meq/L (ref 19–32)
Calcium: 9.5 mg/dL (ref 8.4–10.5)
Chloride: 103 meq/L (ref 96–112)
Creatinine, Ser: 0.69 mg/dL (ref 0.40–1.20)
GFR: 115.74 mL/min (ref >60.00)
Glucose, Bld: 95 mg/dL (ref 70–99)
Potassium: 4.3 meq/L (ref 3.5–5.1)
Sodium: 139 meq/L (ref 135–145)
Total Bilirubin: 0.3 mg/dL (ref 0.2–1.2)
Total Protein: 7.8 g/dL (ref 6.0–8.3)

## 2023-06-10 LAB — LIPASE: Lipase: 35 U/L (ref 11.0–59.0)

## 2023-06-10 LAB — HEMOGLOBIN A1C: Hgb A1c MFr Bld: 5.4 % (ref 4.6–6.5)

## 2023-06-10 MED ORDER — LORAZEPAM 0.5 MG PO TABS
0.5000 mg | ORAL_TABLET | ORAL | 0 refills | Status: DC | PRN
Start: 2023-06-10 — End: 2023-08-09

## 2023-06-10 NOTE — Patient Instructions (Addendum)
 Please go downstairs for labs, urine test and pick up the stool kit before you leave.  Stay hydrated and also add some electrolytes to your water.  Liquid IV is a good option but only 1 or 2/week if having diarrhea.  Try Imodium over-the-counter  I referred you to [facility] gastroenterology and they should reach out to schedule you soon.  Let me know if you have not heard in 1 week.

## 2023-06-10 NOTE — Progress Notes (Signed)
 Subjective:     Patient ID: Amanda Wilson, female    DOB: 1990/06/23, 33 y.o.   MRN: [id number]  Chief Complaint  Patient presents with   Medical Management of Chronic Issues    3 month f/u    HPI  History of Present Illness          Here to follow up on chronic health conditions   Psychiatrist - [facility]  Dr. Dene Fines [facility] working with her birth control  Allergist-    C/o diarrhea after eating. States the thought of eating makes her nauseated at times.  Symptoms worsened after having her gallbladder removed in December 2024.   She is having watery diarrhea several times per day, 4-5 days per week  Also reports reports rectal pain and "tearing sensation".   C/o urinary frequency. No dysuria.   Taking B12, iron, probiotic, and D   She has an upcoming dental visit and has a lot of anxiety. Requests medication for the visit.      Health Maintenance Due  Topic Date Due   HIV Screening  Never done   Hepatitis C Screening  Never done   Cervical Cancer Screening (HPV/Pap Cotest)  Never done    Past Medical History:  Diagnosis Date   ADHD (attention deficit hyperactivity disorder)    Allergy     Anemia    Anxiety    Complication of anesthesia    said nerve block didn't work on finger   COVID-19    early February   Depression    Family history of adverse reaction to anesthesia    Father- slow to awaken also- father had part of lung remved for an Empyema.   Frequent headaches    migraines- maybe 1 once a month since starting to take Topmax on a daily basis   Neuromuscular disorder (HCC)    nerve pain in right small finger after surgery   Pneumonia    as a  child    Past Surgical History:  Procedure Laterality Date   CHOLECYSTECTOMY N/A 01/11/2023   Procedure: LAPAROSCOPIC CHOLECYSTECTOMY WITH INTRAOPERATIVE CHOLANGIOGRAM;  Surgeon: Dareen Ebbing, MD;  Location: [facility] OR;  Service: General;  Laterality: N/A;  RNFA Assist   HARDWARE REMOVAL  Right 05/05/2020   Procedure: RIGHT HAND HARDWARE REMOVAL AND SMALL FINGER METACARPOPHALANGEAL JOINT RELEASE;  Surgeon: Rober Chimera, MD;  Location: [facility] OR;  Service: Orthopedics;  Laterality: Right;   OPEN REDUCTION INTERNAL FIXATION (ORIF) PROXIMAL PHALANX Right 11/05/2019   Procedure: OPEN TREATMENT OF RIGHT RING FINGER FRACTURE AND 5TH METACARPAL FRACTURE;  Surgeon: Rober Chimera, MD;  Location: [facility] OR;  Service: Orthopedics;  Laterality: Right;  LENGTH OF SURGERY: 90 MIN  PRE OP BLOCK    Family History  Problem Relation Age of Onset   Allergic rhinitis Mother    Cancer Mother        skin   Depression Mother    Anxiety disorder Mother    Arthritis Mother    Allergic rhinitis Father    Heart disease Father    Stroke Father    Diabetes Father    Atrial fibrillation Father    Allergic rhinitis Sister    Depression Sister    Anxiety disorder Sister    Urticaria Brother    Eczema Brother    Asthma Brother    Allergic rhinitis Brother    Anxiety disorder Brother    Depression Maternal Aunt    Anxiety disorder Maternal Aunt    Dementia Maternal Grandfather  Atrial fibrillation Paternal Grandfather    Depression Cousin    Angioedema Neg Hx     Social History   Socioeconomic History   Marital status: Single    Spouse name: Not on file   Number of children: 0   Years of education: Not on file   Highest education level: Master's degree (e.g., MA, MS, MEng, MEd, MSW, MBA)  Occupational History   Not on file  Tobacco Use   Smoking status: Never    Passive exposure: Never   Smokeless tobacco: Never  Vaping Use   Vaping status: Never Used  Substance and Sexual Activity   Alcohol use: Not Currently   Drug use: Never   Sexual activity: Not Currently    Partners: Male    Comment: Nuvaring  Other Topics Concern   Not on file  Social History Narrative   Not on file   Social Drivers of Health   Financial Resource Strain: Low Risk  (02/25/2023)   Overall Financial  Resource Strain (CARDIA)    Difficulty of Paying Living Expenses: Not very hard  Food Insecurity: No Food Insecurity (02/25/2023)   Hunger Vital Sign    Worried About Running Out of Food in the Last Year: Never true    Ran Out of Food in the Last Year: Never true  Transportation Needs: No Transportation Needs (02/25/2023)   PRAPARE - Administrator, Civil Service (Medical): No    Lack of Transportation (Non-Medical): No  Physical Activity: Unknown (02/25/2023)   Exercise Vital Sign    Days of Exercise per Week: 0 days    Minutes of Exercise per Session: Not on file  Stress: Stress Concern Present (02/25/2023)   [employer] of Occupational Health - Occupational Stress Questionnaire    Feeling of Stress : Very much  Social Connections: Socially Isolated (02/25/2023)   Social Connection and Isolation Panel [NHANES]    Frequency of Communication with Friends and Family: More than three times a week    Frequency of Social Gatherings with Friends and Family: Once a week    Attends Religious Services: Never    Database administrator or Organizations: No    Attends Engineer, structural: Not on file    Marital Status: Never married  Intimate Partner Violence: Not At Risk (12/10/2017)   Humiliation, Afraid, Rape, and Kick questionnaire    Fear of Current or Ex-Partner: No    Emotionally Abused: No    Physically Abused: No    Sexually Abused: No    Outpatient Medications Prior to Visit  Medication Sig Dispense Refill   amphetamine -dextroamphetamine  (ADDERALL XR) 30 MG 24 hr capsule Take 30 mg by mouth every morning.     Carbinoxamine  Maleate 4 MG TABS Take 1 to 2 tablets twice a day if needed for runny nose or itch 120 tablet 5   cloNIDine (CATAPRES) 0.1 MG tablet Take 0.1 mg by mouth at bedtime.     Dermatological Products, Misc. (DERMAREST ROSACEA EX) Apply 1 Application topically in the morning and at bedtime. Triple rosacea cream (azelaic  acid/ivermectin/metronidazole)     drospirenone-ethinyl estradiol  (YAZ) 3-0.02 MG tablet Take 1 tablet by mouth daily.     Ferrous Sulfate (IRON PO) Take by mouth.     MELATONIN PO Take 5 mg by mouth at bedtime.     Olopatadine-Mometasone (RYALTRIS ) 665-25 MCG/ACT SUSP Place 2 sprays into the nose 2 (two) times daily as needed. 29 g 5   polyethylene glycol (MIRALAX /  GLYCOLAX) 17 g packet Take 17 g by mouth in the morning.     triamcinolone  (KENALOG ) 0.025 % ointment Apply 1 Application topically 2 (two) times daily. 30 g 3   Vitamin D , Ergocalciferol , (DRISDOL ) 1.25 MG (50000 UNIT) CAPS capsule Take 1 capsule (50,000 Units total) by mouth every 7 (seven) days. (Patient taking differently: Take 50,000 Units by mouth every Monday.) 12 capsule 0   amphetamine -dextroamphetamine  (ADDERALL) 30 MG tablet Take 1 tablet by mouth every morning. 30 tablet 0   amphetamine -dextroamphetamine  (ADDERALL) 20 MG tablet Take 1 tablet (20 mg total) by mouth daily as needed (in the afternoon if needed for focus/attentiveness). (Patient not taking: Reported on 06/10/2023) 30 tablet 0   hydrOXYzine  (ATARAX ) 25 MG tablet Take 0.5-2 tablets (12.5-50 mg total) by mouth 2 (two) times daily as needed for anxiety. 30 tablet 0   Iron-Vitamins (GERITOL) LIQD Take 1 Dose by mouth at bedtime. (Patient not taking: Reported on 06/10/2023)     LORazepam (ATIVAN) 0.5 MG tablet Take by mouth. (Patient not taking: Reported on 06/10/2023)     No facility-administered medications prior to visit.    Allergies  Allergen Reactions   Duricef [Cefadroxil]     Skin sloughing   Latex Rash    Review of Systems  Constitutional:  Positive for weight loss. Negative for chills and fever.  Respiratory:  Negative for shortness of breath.   Cardiovascular:  Negative for chest pain, palpitations and leg swelling.  Gastrointestinal:  Positive for abdominal pain, diarrhea and nausea. Negative for constipation and vomiting.  Genitourinary:   Positive for frequency. Negative for dysuria and urgency.  Neurological:  Negative for dizziness, tingling, focal weakness and headaches.  Endo/Heme/Allergies:  Positive for polydipsia.  Psychiatric/Behavioral:  The patient is nervous/anxious.        Objective:     Physical Exam Constitutional:      General: She is not in acute distress.    Appearance: She is obese. She is not ill-appearing.  HENT:     Mouth/Throat:     Mouth: Mucous membranes are moist.  Eyes:     Extraocular Movements: Extraocular movements intact.     Conjunctiva/sclera: Conjunctivae normal.  Cardiovascular:     Rate and Rhythm: Normal rate and regular rhythm.  Pulmonary:     Effort: Pulmonary effort is normal.     Breath sounds: Normal breath sounds.  Abdominal:     General: Bowel sounds are normal. There is no distension.     Palpations: Abdomen is soft.     Tenderness: There is generalized abdominal tenderness. There is no guarding or rebound. Negative signs include Murphy's sign and McBurney's sign.  Musculoskeletal:     Cervical back: Normal range of motion and neck supple.  Skin:    General: Skin is warm and dry.  Neurological:     General: No focal deficit present.     Mental Status: She is alert and oriented to person, place, and time.     Motor: No weakness.     Coordination: Coordination normal.     Gait: Gait normal.  Psychiatric:        Mood and Affect: Mood normal.        Behavior: Behavior normal.        Thought Content: Thought content normal.      BP 122/84 (BP Location: Left Arm, Patient Position: Sitting)   Pulse 81   Temp 97.6 F (36.4 C) (Temporal)   Ht 5\' 4"  (1.626 m)  Wt (!) 309 lb (140.2 kg)   SpO2 97%   BMI 53.04 kg/m  Wt Readings from Last 3 Encounters:  06/10/23 (!) 309 lb (140.2 kg)  04/28/23 (!) 314 lb (142.4 kg)  03/01/23 (!) 317 lb (143.8 kg)       Assessment & Plan:   Problem List Items Addressed This Visit     Diarrhea - Primary   Relevant Orders    Ambulatory referral to Gastroenterology   CBC with Differential/Platelet   Comprehensive metabolic panel with GFR   TSH   T4, free   GI Profile, Stool, PCR   Iron deficiency anemia due to chronic blood loss   Relevant Medications   Ferrous Sulfate (IRON PO)   Other Relevant Orders   CBC with Differential/Platelet   Ferritin   Folate   Nausea   Relevant Orders   Ambulatory referral to Gastroenterology   Lipase   Postprandial abdominal bloating   Relevant Orders   Ambulatory referral to Gastroenterology   Lipase   Severe obesity (BMI >= 40) (HCC)   Relevant Medications   amphetamine -dextroamphetamine  (ADDERALL XR) 30 MG 24 hr capsule   Other Relevant Orders   Hemoglobin A1c   Situational anxiety   Relevant Medications   LORazepam (ATIVAN) 0.5 MG tablet   Vitamin D  deficiency   Relevant Orders   VITAMIN D  25 Hydroxy (Vit-D Deficiency, Fractures)   Other Visit Diagnoses       Urinary frequency       Relevant Orders   Hemoglobin A1c   Urinalysis with Culture, if indicated     Medication management       Relevant Orders   VITAMIN D  25 Hydroxy (Vit-D Deficiency, Fractures)   Vitamin B12     Increased thirst       Relevant Orders   Hemoglobin A1c   TSH   T4, free      Here to follow-up on chronic health conditions and has several new concerns today. Recent persistent diarrhea and nausea.  Reduced appetite.  Check labs and stool studies.  Try Imodium as long as no fever or blood.  Eat a bland diet.  Stay hydrated.  Referral to GI.  Check vitamin D  and B12 for deficiencies.  Replace as warranted. She will continue seeing her psychiatrist. Check urinalysis with reflex to urine culture due to urinary frequency.  Screen for diabetes due to increased thirst and urinary frequency. Reports having nausea medication at home. Lorazepam prescribed for upcoming dental procedure due to severe anxiety. Follow-up pending lab results   I have discontinued Jaclin Courington's  Geritol and hydrOXYzine . I have also changed her LORazepam. Additionally, I am having her maintain her Vitamin D  (Ergocalciferol ), polyethylene glycol, MELATONIN PO, (Dermatological Products, Misc. (DERMAREST ROSACEA EX)), triamcinolone , Carbinoxamine  Maleate, Ryaltris , cloNIDine, amphetamine -dextroamphetamine , Ferrous Sulfate (IRON PO), and drospirenone-ethinyl estradiol .  Meds ordered this encounter  Medications   LORazepam (ATIVAN) 0.5 MG tablet    Sig: Take 1 tablet (0.5 mg total) by mouth every 4 (four) hours as needed for anxiety.    Dispense:  2 tablet    Refill:  0    Supervising Provider:   Bambi Lever A [[id number]]

## 2023-06-13 DIAGNOSIS — R197 Diarrhea, unspecified: Secondary | ICD-10-CM | POA: Diagnosis not present

## 2023-06-14 LAB — T4, FREE: Free T4: 0.77 ng/dL (ref 0.60–1.60)

## 2023-06-14 LAB — FOLATE: Folate: 6 ng/mL (ref >5.9)

## 2023-06-14 LAB — VITAMIN D 25 HYDROXY (VIT D DEFICIENCY, FRACTURES): VITD: 9.71 ng/mL — ABNORMAL LOW (ref 30.00–100.00)

## 2023-06-14 LAB — VITAMIN B12: Vitamin B-12: 242 pg/mL (ref 211–911)

## 2023-06-14 LAB — FERRITIN: Ferritin: 10 ng/mL (ref 10.0–291.0)

## 2023-06-14 LAB — TSH: TSH: 1.6 u[IU]/mL (ref 0.35–5.50)

## 2023-06-15 ENCOUNTER — Ambulatory Visit: Payer: Self-pay | Admitting: Family Medicine

## 2023-06-15 ENCOUNTER — Other Ambulatory Visit: Payer: Self-pay | Admitting: Family Medicine

## 2023-06-15 LAB — GI PROFILE, STOOL, PCR

## 2023-06-15 MED ORDER — VITAMIN D (ERGOCALCIFEROL) 1.25 MG (50000 UNIT) PO CAPS
50000.0000 [IU] | ORAL_CAPSULE | ORAL | 0 refills | Status: AC
Start: 2023-06-15 — End: ?

## 2023-06-15 NOTE — Progress Notes (Signed)
 Her stool test was positive for norovirus.  This is a viral illness that causes GI symptoms and should run its course and resolve.  Norovirus is very contagious so take proper precautions with handwashing and protecting others.  Stay hydrated.

## 2023-06-17 DIAGNOSIS — F413 Other mixed anxiety disorders: Secondary | ICD-10-CM | POA: Diagnosis not present

## 2023-06-21 ENCOUNTER — Encounter: Payer: Self-pay | Admitting: Physician Assistant

## 2023-06-22 ENCOUNTER — Telehealth: Admitting: Physician Assistant

## 2023-06-22 DIAGNOSIS — L03211 Cellulitis of face: Secondary | ICD-10-CM

## 2023-06-22 MED ORDER — SULFAMETHOXAZOLE-TRIMETHOPRIM 800-160 MG PO TABS: 1.0000 | ORAL_TABLET | Freq: Two times a day (BID) | ORAL | 0 refills | Status: DC

## 2023-06-22 NOTE — Progress Notes (Signed)
 I have spent 5 minutes in review of e-visit questionnaire, review and updating patient chart, medical decision making and response to patient.   Piedad Climes, PA-C

## 2023-06-22 NOTE — Progress Notes (Signed)
 E Visit for Cellulitis  We are sorry that you are not feeling well. Here is how we plan to help!  Based on what you shared with me it looks like you have cellulitis.  Cellulitis looks like areas of skin redness, swelling, and warmth; it develops as a result of bacteria entering under the skin. Little red spots and/or bleeding can be seen in skin, and tiny surface sacs containing fluid can occur. Fever can be present. Cellulitis is almost always on one side of a body, and the lower limbs are the most common site of involvement.   I have prescribed:  Bactrim DS 1 tablet by mouth twice a day for 7 days  HOME CARE:  Take your medications as ordered and take all of them, even if the skin irritation appears to be healing.   GET HELP RIGHT AWAY IF:  Symptoms that don't begin to go away within 48 hours. Severe redness persists or worsens If the area turns color, spreads or swells. If it blisters and opens, develops yellow-brown crust or bleeds. You develop a fever or chills. If the pain increases or becomes unbearable.  Are unable to keep fluids and food down.  MAKE SURE YOU   Understand these instructions. Will watch your condition. Will get help right away if you are not doing well or get worse.  Thank you for choosing an e-visit.  Your e-visit answers were reviewed by a board certified advanced clinical practitioner to complete your personal care plan. Depending upon the condition, your plan could have included both over the counter or prescription medications.  Please review your pharmacy choice. Make sure the pharmacy is open so you can pick up prescription now. If there is a problem, you may contact your provider through [employer] and have the prescription routed to another pharmacy.  Your safety is important to [place]. If you have drug allergies check your prescription carefully.   For the next 24 hours you can use MyChart to ask questions about today's visit, request a  non-urgent call back, or ask for a work or school excuse. You will get an email in the next two days asking about your experience. I hope that your e-visit has been valuable and will speed your recovery.

## 2023-07-01 DIAGNOSIS — F413 Other mixed anxiety disorders: Secondary | ICD-10-CM | POA: Diagnosis not present

## 2023-07-01 DIAGNOSIS — Z01419 Encounter for gynecological examination (general) (routine) without abnormal findings: Secondary | ICD-10-CM | POA: Diagnosis not present

## 2023-07-20 DIAGNOSIS — F909 Attention-deficit hyperactivity disorder, unspecified type: Secondary | ICD-10-CM | POA: Diagnosis not present

## 2023-07-20 DIAGNOSIS — F419 Anxiety disorder, unspecified: Secondary | ICD-10-CM | POA: Diagnosis not present

## 2023-07-22 DIAGNOSIS — F413 Other mixed anxiety disorders: Secondary | ICD-10-CM | POA: Diagnosis not present

## 2023-08-01 DIAGNOSIS — F413 Other mixed anxiety disorders: Secondary | ICD-10-CM | POA: Diagnosis not present

## 2023-08-05 ENCOUNTER — Encounter: Payer: Self-pay | Admitting: Family Medicine

## 2023-08-05 ENCOUNTER — Other Ambulatory Visit: Payer: Self-pay

## 2023-08-05 ENCOUNTER — Ambulatory Visit (INDEPENDENT_AMBULATORY_CARE_PROVIDER_SITE_OTHER): Admitting: Family Medicine

## 2023-08-05 VITALS — BP 124/76 | HR 92 | Temp 98.5°F | Wt 305.7 lb

## 2023-08-05 DIAGNOSIS — L239 Allergic contact dermatitis, unspecified cause: Secondary | ICD-10-CM | POA: Diagnosis not present

## 2023-08-05 DIAGNOSIS — J302 Other seasonal allergic rhinitis: Secondary | ICD-10-CM

## 2023-08-05 DIAGNOSIS — B999 Unspecified infectious disease: Secondary | ICD-10-CM

## 2023-08-05 DIAGNOSIS — J3089 Other allergic rhinitis: Secondary | ICD-10-CM | POA: Diagnosis not present

## 2023-08-05 MED ORDER — MONTELUKAST SODIUM 10 MG PO TABS: 10.0000 mg | ORAL_TABLET | Freq: Every day | ORAL | 5 refills | Status: DC

## 2023-08-05 NOTE — Progress Notes (Signed)
 [address] [place] [address] Dept: [phone]  FOLLOW UP NOTE  Patient ID: Amanda Wilson, female    DOB: Apr 13, 1990  Age: 33 y.o. MRN: [id number] Date of Office Visit: 08/05/2023  Assessment  Chief Complaint: Seasonal and perennial allergic rhinitis  HPI Amanda Wilson is a 33 year old female who presents to the clinic for follow-up visit.  She was originally seen in this clinic on 04/28/2023 by Dr. Iva as a new patient for evaluation of chronic rhinitis, allergic contact dermatitis, and recurrent infection.  At today's visit, she reports her allergic rhinitis has been poorly controlled with symptoms including postnasal drainage, nasal congestion, and cough producing thick mucus occurring in the morning.  She continues carbinoxamine  twice a day with no relief of symptoms.  She reports that she was previously using Claritin and cetirizine which was providing some relief of symptoms.  She continues Ryaltris  2 sprays in each nostril twice a day with some relief of symptoms.  Her most recent environmental allergy  skin testing on 04/28/2023 which was positive to grass pollen, weed pollen, tree pollen, mold, dust mite, and cat.  We discussed the possible benefit of montelukast  in addition to allergy  injections or sublingual therapy to dust mite as she is needle phobic.  She reports that she has not had any further contact dermatitis rashes and has not required triamcinolone .  She does report 1 episode of cellulitis requiring antibiotic in June 2025.  She reports that she is up-to-date on all vaccines.  We discussed moving forward with immune screening at this time.  Of note, she reports that she is going to see a GI provider for evaluation of possible malabsorption on 08/09/2023  Her current medications are listed in the chart.  Drug Allergies:  Allergies  Allergen Reactions   Duricef [Cefadroxil]     Skin sloughing   Latex Rash    Physical Exam: BP 124/76 (BP Location:  Left Arm, Patient Position: Sitting, Cuff Size: Large)   Pulse 92   Temp 98.5 F (36.9 C) (Temporal)   Wt (!) 305 lb 11.2 oz (138.7 kg)   SpO2 97%   BMI 52.47 kg/m    Physical Exam Vitals reviewed.  Constitutional:      Appearance: Normal appearance.  HENT:     Head: Normocephalic and atraumatic.     Right Ear: Tympanic membrane normal.     Left Ear: Tympanic membrane normal.     Nose:     Comments: Bilateral nares slightly erythematous with thin clear nasal drainage noted.  Pharynx normal.  Ears normal.  Eyes normal.    Mouth/Throat:     Pharynx: Oropharynx is clear.  Eyes:     Conjunctiva/sclera: Conjunctivae normal.  Cardiovascular:     Rate and Rhythm: Normal rate and regular rhythm.     Heart sounds: Normal heart sounds. No murmur heard. Pulmonary:     Effort: Pulmonary effort is normal.     Breath sounds: Normal breath sounds.     Comments: Lungs clear to auscultation Musculoskeletal:        General: Normal range of motion.     Cervical back: Normal range of motion and neck supple.  Skin:    General: Skin is warm and dry.  Neurological:     Mental Status: She is alert and oriented to person, place, and time.  Psychiatric:        Mood and Affect: Mood normal.        Behavior: Behavior normal.  Thought Content: Thought content normal.        Judgment: Judgment normal.     Assessment and Plan: 1. Seasonal and perennial allergic rhinitis   2. Allergic contact dermatitis, unspecified trigger   3. Recurrent infections     Meds ordered this encounter  Medications   montelukast  (SINGULAIR ) 10 MG tablet    Sig: Take 1 tablet (10 mg total) by mouth at bedtime.    Dispense:  30 tablet    Refill:  5    Patient Instructions  Allergic rhinitis Continue allergen avoidance measures directed toward grass pollen, weed pollen, tree pollen, mold, dust mite, and cat hair as listed below Begin montelukast  10 mg once a day to control allergy  symptoms. Patient  cautioned that rarely some children/adults can experience behavioral changes after beginning montelukast . These side effects are rare, however, if you notice any change, notify the clinic and discontinue montelukast .  Stop carbinoxamine  at this time. Begin taking cetirizine and loratadine as you had been  Continue Ryaltris  1-2 sprays in each nostril up to twice a day if needed for nasal symptoms.  In the right nostril, point the applicator out toward the right ear. In the left nostril, point the applicator out toward the left ear Consider saline nasal rinses as needed for nasal symptoms. Use this before any medicated nasal sprays for best result If your symptoms are not well-controlled with the treatment plan as listed above, consider allergen immunotherapy. If not interested in allergen immunotherapy, consider sublingual therapy directed toward dust mite  Allergic contact dermatitis We can consider doing patch testing in the future (metals, chemicals, etc).  Continue triamcinolone  0.025% ointment twice daily as needed for the ear.   Recurrent infections  Lab work has been ordered to help us  evaluate your immune system.  We will call you when the results become available.  Call the clinic if this today all right treatment plan is not working well for you  Follow up in 3 months or sooner if needed.  Return in about 3 months (around 11/05/2023), or if symptoms worsen or fail to improve.    Thank you for the opportunity to care for this patient.  Please do not hesitate to contact me with questions.  Arlean Mutter, FNP [facility] 

## 2023-08-05 NOTE — Patient Instructions (Addendum)
 Allergic rhinitis Continue allergen avoidance measures directed toward grass pollen, weed pollen, tree pollen, mold, dust mite, and cat hair as listed below Begin montelukast  10 mg once a day to control allergy  symptoms. Patient cautioned that rarely some children/adults can experience behavioral changes after beginning montelukast . These side effects are rare, however, if you notice any change, notify the clinic and discontinue montelukast .  Stop carbinoxamine  at this time. Begin taking cetirizine and loratadine as you had been  Continue Ryaltris  1-2 sprays in each nostril up to twice a day if needed for nasal symptoms.  In the right nostril, point the applicator out toward the right ear. In the left nostril, point the applicator out toward the left ear Consider saline nasal rinses as needed for nasal symptoms. Use this before any medicated nasal sprays for best result If your symptoms are not well-controlled with the treatment plan as listed above, consider allergen immunotherapy. If not interested in allergen immunotherapy, consider sublingual therapy directed toward dust mite  Allergic contact dermatitis We can consider doing patch testing in the future (metals, chemicals, etc).  Continue triamcinolone  0.025% ointment twice daily as needed for the ear.   Recurrent infections Lab work has been ordered to help us  evaluate your immune system.  We will call you when the results become available.  Call the clinic if this treatment plan is not working well for you  Follow up in 3 months or sooner if needed.  Reducing Pollen Exposure The American Academy of Allergy , Asthma and Immunology suggests the following steps to reduce your exposure to pollen during allergy  seasons. Do not hang sheets or clothing out to dry; pollen may collect on these items. Do not mow lawns or spend time around freshly cut grass; mowing stirs up pollen. Keep windows closed at night.  Keep car windows closed while  driving. Minimize morning activities outdoors, a time when pollen counts are usually at their highest. Stay indoors as much as possible when pollen counts or humidity is high and on windy days when pollen tends to remain in the air longer. Use air conditioning when possible.  Many air conditioners have filters that trap the pollen spores. Use a HEPA room air filter to remove pollen form the indoor air you breathe.  Control of Mold Allergen Mold and fungi can grow on a variety of surfaces provided certain temperature and moisture conditions exist.  Outdoor molds grow on plants, decaying vegetation and soil.  The major outdoor mold, Alternaria and Cladosporium, are found in very high numbers during hot and dry conditions.  Generally, a late Summer - Fall peak is seen for common outdoor fungal spores.  Rain will temporarily lower outdoor mold spore count, but counts rise rapidly when the rainy period ends.  The most important indoor molds are Aspergillus and Penicillium.  Dark, humid and poorly ventilated basements are ideal sites for mold growth.  The next most common sites of mold growth are the bathroom and the kitchen.  Outdoor [employer] Use air conditioning and keep windows closed Avoid exposure to decaying vegetation. Avoid leaf raking. Avoid grain handling. Consider wearing a face mask if working in moldy areas.  Indoor Mold Control Maintain humidity below 50%. Clean washable surfaces with 5% bleach solution. Remove sources e.g. Contaminated carpets.  Control of Dog or Cat Allergen Avoidance is the best way to manage a dog or cat allergy . If you have a dog or cat and are allergic to dog or cats, consider removing the dog or cat  from the home. If you have a dog or cat but don't want to find it a new home, or if your family wants a pet even though someone in the household is allergic, here are some strategies that may help keep symptoms at bay:  Keep the pet out of your bedroom and  restrict it to only a few rooms. Be advised that keeping the dog or cat in only one room will not limit the allergens to that room. Don't pet, hug or kiss the dog or cat; if you do, wash your hands with soap and water. High-efficiency particulate air (HEPA) cleaners run continuously in a bedroom or living room can reduce allergen levels over time. Regular use of a high-efficiency vacuum cleaner or a central vacuum can reduce allergen levels. Giving your dog or cat a bath at least once a week can reduce airborne allergen.   Control of Dust Mite Allergen Dust mites play a major role in allergic asthma and rhinitis. They occur in environments with high humidity wherever human skin is found. Dust mites absorb humidity from the atmosphere (ie, they do not drink) and feed on organic matter (including shed human and animal skin). Dust mites are a microscopic type of insect that you cannot see with the naked eye. High levels of dust mites have been detected from mattresses, pillows, carpets, upholstered furniture, bed covers, clothes, soft toys and any woven material. The principal allergen of the dust mite is found in its feces. A gram of dust may contain 1,000 mites and 250,000 fecal particles. Mite antigen is easily measured in the air during house cleaning activities. Dust mites do not bite and do not cause harm to humans, other than by triggering allergies/asthma.  Ways to decrease your exposure to dust mites in your home:  1. Encase mattresses, box springs and pillows with a mite-impermeable barrier or cover  2. Wash sheets, blankets and drapes weekly in hot water (130 F) with detergent and dry them in a dryer on the hot setting.  3. Have the room cleaned frequently with a vacuum cleaner and a damp dust-mop. For carpeting or rugs, vacuuming with a vacuum cleaner equipped with a high-efficiency particulate air (HEPA) filter. The dust mite allergic individual should not be in a room which is being  cleaned and should wait 1 hour after cleaning before going into the room.  4. Do not sleep on upholstered furniture (eg, couches).  5. If possible removing carpeting, upholstered furniture and drapery from the home is ideal. Horizontal blinds should be eliminated in the rooms where the person spends the most time (bedroom, study, television room). Washable vinyl, roller-type shades are optimal.  6. Remove all non-washable stuffed toys from the bedroom. Wash stuffed toys weekly like sheets and blankets above.  7. Reduce indoor humidity to less than 50%. Inexpensive humidity monitors can be purchased at most hardware stores. Do not use a humidifier as can make the problem worse and are not recommended.

## 2023-08-09 ENCOUNTER — Ambulatory Visit: Payer: Self-pay | Admitting: Physician Assistant

## 2023-08-09 ENCOUNTER — Encounter: Payer: Self-pay | Admitting: Physician Assistant

## 2023-08-09 ENCOUNTER — Ambulatory Visit: Admitting: Physician Assistant

## 2023-08-09 ENCOUNTER — Other Ambulatory Visit (INDEPENDENT_AMBULATORY_CARE_PROVIDER_SITE_OTHER)

## 2023-08-09 ENCOUNTER — Encounter: Payer: Self-pay | Admitting: Family Medicine

## 2023-08-09 VITALS — BP 124/72 | HR 100 | Ht 64.0 in | Wt 306.0 lb

## 2023-08-09 DIAGNOSIS — R197 Diarrhea, unspecified: Secondary | ICD-10-CM

## 2023-08-09 DIAGNOSIS — K921 Melena: Secondary | ICD-10-CM

## 2023-08-09 DIAGNOSIS — E559 Vitamin D deficiency, unspecified: Secondary | ICD-10-CM

## 2023-08-09 DIAGNOSIS — R1084 Generalized abdominal pain: Secondary | ICD-10-CM | POA: Diagnosis not present

## 2023-08-09 DIAGNOSIS — R110 Nausea: Secondary | ICD-10-CM

## 2023-08-09 DIAGNOSIS — E538 Deficiency of other specified B group vitamins: Secondary | ICD-10-CM

## 2023-08-09 DIAGNOSIS — R634 Abnormal weight loss: Secondary | ICD-10-CM

## 2023-08-09 DIAGNOSIS — D509 Iron deficiency anemia, unspecified: Secondary | ICD-10-CM | POA: Diagnosis not present

## 2023-08-09 DIAGNOSIS — Z9049 Acquired absence of other specified parts of digestive tract: Secondary | ICD-10-CM

## 2023-08-09 DIAGNOSIS — Z8719 Personal history of other diseases of the digestive system: Secondary | ICD-10-CM

## 2023-08-09 LAB — COMPREHENSIVE METABOLIC PANEL WITH GFR
ALT: 6 U/L (ref 0–35)
AST: 8 U/L (ref 0–37)
Albumin: 3.9 g/dL (ref 3.5–5.2)
Alkaline Phosphatase: 71 U/L (ref 39–117)
BUN: 6 mg/dL (ref 6–23)
CO2: 28 meq/L (ref 19–32)
Calcium: 9 mg/dL (ref 8.4–10.5)
Chloride: 101 meq/L (ref 96–112)
Creatinine, Ser: 0.66 mg/dL (ref 0.40–1.20)
GFR: 116.85 mL/min (ref >60.00)
Glucose, Bld: 94 mg/dL (ref 70–99)
Potassium: 3.8 meq/L (ref 3.5–5.1)
Sodium: 137 meq/L (ref 135–145)
Total Bilirubin: 0.5 mg/dL (ref 0.2–1.2)
Total Protein: 7.9 g/dL (ref 6.0–8.3)

## 2023-08-09 LAB — IBC + FERRITIN
Ferritin: 5.4 ng/mL — ABNORMAL LOW (ref 10.0–291.0)
Iron: 49 ug/dL (ref 42–145)
Saturation Ratios: 9.8 % — ABNORMAL LOW (ref 20.0–50.0)
TIBC: 499.8 ug/dL — ABNORMAL HIGH (ref 250.0–450.0)
Transferrin: 357 mg/dL (ref 212.0–360.0)

## 2023-08-09 LAB — SEDIMENTATION RATE: Sed Rate: 34 mm/h — ABNORMAL HIGH (ref 0–20)

## 2023-08-09 LAB — CBC WITH DIFFERENTIAL/PLATELET
Basophils Absolute: 0.1 K/uL (ref 0.0–0.1)
Basophils Relative: 0.6 % (ref 0.0–3.0)
Eosinophils Absolute: 0.1 K/uL (ref 0.0–0.7)
Eosinophils Relative: 0.6 % (ref 0.0–5.0)
HCT: 39.2 % (ref 36.0–46.0)
Hemoglobin: 12.5 g/dL (ref 12.0–15.0)
Lymphocytes Relative: 22.6 % (ref 12.0–46.0)
Lymphs Abs: 2.5 K/uL (ref 0.7–4.0)
MCHC: 31.9 g/dL (ref 30.0–36.0)
MCV: 78.3 fl (ref 78.0–100.0)
Monocytes Absolute: 0.6 K/uL (ref 0.1–1.0)
Monocytes Relative: 5.2 % (ref 3.0–12.0)
Neutro Abs: 8 K/uL — ABNORMAL HIGH (ref 1.4–7.7)
Neutrophils Relative %: 71 % (ref 43.0–77.0)
Platelets: 318 K/uL (ref 150.0–400.0)
RBC: 5.01 Mil/uL (ref 3.87–5.11)
RDW: 15.6 % — ABNORMAL HIGH (ref 11.5–15.5)
WBC: 11.3 K/uL — ABNORMAL HIGH (ref 4.0–10.5)

## 2023-08-09 LAB — B12 AND FOLATE PANEL
Folate: 6 ng/mL (ref >5.9)
Vitamin B-12: 295 pg/mL (ref 211–911)

## 2023-08-09 LAB — C-REACTIVE PROTEIN: CRP: 2.4 mg/dL (ref 0.5–20.0)

## 2023-08-09 LAB — VITAMIN D 25 HYDROXY (VIT D DEFICIENCY, FRACTURES): VITD: 16.24 ng/mL — ABNORMAL LOW (ref 30.00–100.00)

## 2023-08-09 MED ORDER — NA SULFATE-K SULFATE-MG SULF 17.5-3.13-1.6 GM/177ML PO SOLN: 1.0000 | Freq: Once | ORAL | 0 refills | Status: AC

## 2023-08-09 NOTE — Progress Notes (Signed)
 Please set patient up with hematology for Feraheme infusions at this point.  She has tried oral therapy and it is not working for her.  Have her continue vitamin D  supplementation it does seem to be helping.  Thanks, JL L

## 2023-08-09 NOTE — Progress Notes (Signed)
 Chief Complaint: Weight Loss and multiple GI problems  HPI:    Amanda Wilson is a 33 y/o female with a past medical history as listed below including ADHD, and multiple others, who was referred to me by Lendia Boby CROME, NP-C for a complaint of weight loss and multiple GI problems.      12/18/2022 right upper quadrant ultrasound with extensive cholelithiasis.    01/11/2023 cholecystectomy.    02/09/2023 ferritin low at 5.7.  Recheck 06/10/2023 ferritin 10.0.    06/13/2023 GI pathogen panel showed norovirus.  CBC with MCV low at 75.1.  Hemoglobin normal.  CMP normal.  Hemoglobin A1c normal.  TSH and free T4 normal.  Vitamin D  low at 9.71.    Today, patient presents to clinic constellation of GI issues that have been going on for years off-and-on.  Patient tells me that because she is overweight providers have overlooked her other symptoms but now it is too much to dismiss.  She tells me that she is in constant GI distress.  Chronic diarrhea sometimes up to 10-15 times a day or in a two day time period. Has had issues with anal fissures in the past with rectal bleeding and was told this was because she was overweight.  Sounds like she goes through periods of time where she is severely ill and unable to eat with even some nausea and vomiting and food aversion which relates to a 20 to 30 pound weight loss during those times.  Seems to kind of go away and then will come back.  She has tried various diets including lactose-free, gluten-free and vegetarian with no change in symptoms.  She just feels sick over the past year straight.  Initially all started with back cramps and found out this was her gallbladder which she had removed in December.  Tells me she wakes up feeling sick and will go to the bathroom and feel more sick.  Constant nausea with a food aversion, sometimes feels feverish.  Patient cannot remember her last solid stool, also has mucus at times and dry heaving of the rectum.  She has also  been found to be vitamin D , B12 and iron deficient and has been on supplementation over the year with no adequate response.  Also generalized abdominal cramping and pain most days.  Associated symptoms include joint pain and rosacea.    Does note that her periods were quite heavy and so she was put on birth control due to a diagnosis of PCOS, but she has not had a heavy period in 6 months and remains iron deficient.  Still on an iron supplement and vitamin D  supplement.    Denies fever, chills or family history of IBD.  Past Medical History:  Diagnosis Date   ADHD (attention deficit hyperactivity disorder)    Allergy     Anal fissure    Anemia    Anxiety    Complication of anesthesia    said nerve block didn't work on finger   COVID-19    early February   Depression    Family history of adverse reaction to anesthesia    Father- slow to awaken also- father had part of lung remved for an Empyema.   Frequent headaches    migraines- maybe 1 once a month since starting to take Topmax on a daily basis   Gallstones    Neuromuscular disorder (HCC)    nerve pain in right small finger after surgery   Obesity    Pneumonia  as a  child    Past Surgical History:  Procedure Laterality Date   CHOLECYSTECTOMY N/A 01/11/2023   Procedure: LAPAROSCOPIC CHOLECYSTECTOMY WITH INTRAOPERATIVE CHOLANGIOGRAM;  Surgeon: Belinda Cough, MD;  Location: [facility] OR;  Service: General;  Laterality: N/A;  RNFA Assist   HARDWARE REMOVAL Right 05/05/2020   Procedure: RIGHT HAND HARDWARE REMOVAL AND SMALL FINGER METACARPOPHALANGEAL JOINT RELEASE;  Surgeon: Sebastian Lenis, MD;  Location[facility] OR;  Service: Orthopedics;  Laterality: Right;   OPEN REDUCTION INTERNAL FIXATION (ORIF) PROXIMAL PHALANX Right 11/05/2019   Procedure: OPEN TREATMENT OF RIGHT RING FINGER FRACTURE AND 5TH METACARPAL FRACTURE;  Surgeon: Sebastian Lenis, MD;  Location: [facility] OR;  Service: Orthopedics;  Laterality: Right;  LENGTH OF SURGERY: 90 MIN  PRE  OP BLOCK    Current Outpatient Medications  Medication Sig Dispense Refill   amphetamine -dextroamphetamine  (ADDERALL XR) 30 MG 24 hr capsule Take 30 mg by mouth every morning.     cloNIDine (CATAPRES) 0.1 MG tablet Take 0.1 mg by mouth at bedtime.     Dermatological Products, Misc. (DERMAREST ROSACEA EX) Apply 1 Application topically in the morning and at bedtime. Triple rosacea cream (azelaic acid/ivermectin/metronidazole)     drospirenone-ethinyl estradiol  (YAZ) 3-0.02 MG tablet Take 1 tablet by mouth daily.     Ferrous Sulfate (IRON PO) Take by mouth.     FLUoxetine  (PROZAC ) 20 MG capsule Take 20 mg by mouth every morning.     MELATONIN PO Take 5 mg by mouth at bedtime.     montelukast  (SINGULAIR ) 10 MG tablet Take 1 tablet (10 mg total) by mouth at bedtime. 30 tablet 5   NYSTATIN powder Apply topically as needed.     Olopatadine-Mometasone (RYALTRIS ) 665-25 MCG/ACT SUSP Place 2 sprays into the nose 2 (two) times daily as needed. 29 g 5   polyethylene glycol (MIRALAX / GLYCOLAX) 17 g packet Take 17 g by mouth in the morning. (Patient taking differently: Take 17 g by mouth daily as needed for mild constipation.)     triamcinolone  (KENALOG ) 0.025 % ointment Apply 1 Application topically 2 (two) times daily. (Patient taking differently: Apply 1 Application topically as needed.) 30 g 3   Vitamin D , Ergocalciferol , (DRISDOL ) 1.25 MG (50000 UNIT) CAPS capsule Take 1 capsule (50,000 Units total) by mouth every 7 (seven) days. 12 capsule 0   No current facility-administered medications for this visit.    Allergies as of 08/09/2023 - Review Complete 08/09/2023  Allergen Reaction Noted   Duricef [cefadroxil]  05/30/2016   Latex Rash 05/30/2016    Family History  Problem Relation Age of Onset   Allergic rhinitis Mother    Cancer Mother        skin   Depression Mother    Anxiety disorder Mother    Arthritis Mother    Allergic rhinitis Father    Heart disease Father    Stroke Father     Diabetes Father    Atrial fibrillation Father    Allergic rhinitis Sister    Depression Sister    Anxiety disorder Sister    Other Sister        some form of celiac   Urticaria Brother    Eczema Brother    Asthma Brother    Allergic rhinitis Brother    Anxiety disorder Brother    Dementia Maternal Grandfather    Atrial fibrillation Paternal Grandfather    Depression Maternal Aunt    Anxiety disorder Maternal Aunt    Depression Cousin    Angioedema Neg  Hx     Social History   Socioeconomic History   Marital status: Single    Spouse name: Not on file   Number of children: 0   Years of education: Not on file   Highest education level: Master's degree (e.g., MA, MS, MEng, MEd, MSW, MBA)  Occupational History   Not on file  Tobacco Use   Smoking status: Never    Passive exposure: Never   Smokeless tobacco: Never  Vaping Use   Vaping status: Never Used  Substance and Sexual Activity   Alcohol use: Not Currently   Drug use: Never   Sexual activity: Not Currently    Partners: Male    Comment: Nuvaring  Other Topics Concern   Not on file  Social History Narrative   Not on file   Social Drivers of Health   Financial Resource Strain: Low Risk  (02/25/2023)   Overall Financial Resource Strain (CARDIA)    Difficulty of Paying Living Expenses: Not very hard  Food Insecurity: No Food Insecurity (02/25/2023)   Hunger Vital Sign    Worried About Running Out of Food in the Last Year: Never true    Ran Out of Food in the Last Year: Never true  Transportation Needs: No Transportation Needs (02/25/2023)   PRAPARE - Administrator, Civil Service (Medical): No    Lack of Transportation (Non-Medical): No  Physical Activity: Unknown (02/25/2023)   Exercise Vital Sign    Days of Exercise per Week: 0 days    Minutes of Exercise per Session: Not on file  Stress: Stress Concern Present (02/25/2023)   [employer] of Occupational Health - Occupational Stress  Questionnaire    Feeling of Stress : Very much  Social Connections: Socially Isolated (02/25/2023)   Social Connection and Isolation Panel    Frequency of Communication with Friends and Family: More than three times a week    Frequency of Social Gatherings with Friends and Family: Once a week    Attends Religious Services: Never    Database administrator or Organizations: No    Attends Engineer, structural: Not on file    Marital Status: Never married  Intimate Partner Violence: Not At Risk (12/10/2017)   Humiliation, Afraid, Rape, and Kick questionnaire    Fear of Current or Ex-Partner: No    Emotionally Abused: No    Physically Abused: No    Sexually Abused: No    Review of Systems:    Constitutional: No weight loss, fever or chills Skin: No rash  Cardiovascular: No chest pain  Respiratory: No SOB  Gastrointestinal: See HPI and otherwise negative Genitourinary: No dysuria  Neurological: No headache, dizziness or syncope Musculoskeletal: No new muscle or joint pain Hematologic: No bruising Psychiatric: No history of depression or anxiety   Physical Exam:  Vital signs: BP 124/72 Comment: large cuff  Pulse 100   Ht 5' 4 (1.626 m)   Wt (!) 306 lb (138.8 kg)   BMI 52.52 kg/m    Constitutional:   Pleasant morbidly obese Caucasian female appears to be in NAD, Well developed, Well nourished, alert and cooperative Head:  Normocephalic and atraumatic. Eyes:   PEERL, EOMI. No icterus. Conjunctiva pink. Ears:  Normal auditory acuity. Neck:  Supple Throat: Oral cavity and pharynx without inflammation, swelling or lesion.  Respiratory: Respirations even and unlabored. Lungs clear to auscultation bilaterally.   No wheezes, crackles, or rhonchi.  Cardiovascular: Normal S1, S2. No MRG. Regular rate and rhythm. No  peripheral edema, cyanosis or pallor.  Gastrointestinal:  Soft, nondistended, mild generalized TTP, moderate TTP in the epigastrium, no rebound or guarding. Normal  bowel sounds. No appreciable masses or hepatomegaly. Rectal: Offered and patient declined today Msk:  Symmetrical without gross deformities. Without edema, no deformity or joint abnormality.  Neurologic:  Alert and  oriented x4;  grossly normal neurologically.  Skin:   Dry and intact without significant lesions or rashes. Psychiatric: Demonstrates good judgement and reason without abnormal affect or behaviors.  RELEVANT LABS AND IMAGING: CBC    Component Value Date/Time   WBC 8.7 06/10/2023 0906   RBC 5.09 06/10/2023 0906   HGB 12.0 06/10/2023 0906   HGB 11.3 02/09/2018 1015   HCT 38.2 06/10/2023 0906   HCT 35.9 02/09/2018 1015   PLT 322.0 06/10/2023 0906   PLT 288 02/09/2018 1015   MCV 75.1 (L) 06/10/2023 0906   MCV 77 (L) 02/09/2018 1015   MCH 22.7 (L) 01/07/2023 1520   MCHC 31.5 06/10/2023 0906   RDW 17.5 (H) 06/10/2023 0906   RDW 14.3 02/09/2018 1015   LYMPHSABS 2.2 06/10/2023 0906   LYMPHSABS 2.2 02/09/2018 1015   MONOABS 0.5 06/10/2023 0906   EOSABS 0.1 06/10/2023 0906   EOSABS 0.1 02/09/2018 1015   BASOSABS 0.0 06/10/2023 0906   BASOSABS 0.0 02/09/2018 1015    CMP     Component Value Date/Time   NA 139 06/10/2023 0906   NA 139 02/09/2018 1015   K 4.3 06/10/2023 0906   CL 103 06/10/2023 0906   CO2 27 06/10/2023 0906   GLUCOSE 95 06/10/2023 0906   BUN 11 06/10/2023 0906   BUN 9 02/09/2018 1015   CREATININE 0.69 06/10/2023 0906   CALCIUM 9.5 06/10/2023 0906   PROT 7.8 06/10/2023 0906   PROT 7.0 02/09/2018 1015   ALBUMIN 3.8 06/10/2023 0906   ALBUMIN 3.7 02/09/2018 1015   AST 9 06/10/2023 0906   ALT 7 06/10/2023 0906   ALKPHOS 68 06/10/2023 0906   BILITOT 0.3 06/10/2023 0906   BILITOT 0.4 02/09/2018 1015   GFRNONAA >60 12/18/2022 0600   GFRAA >60 10/28/2019 2247    Assessment: 1.  Generalized abdominal pain: For the patient's whole life with some bloody stools, history of anal fissures, nausea, diarrhea and at times significant weight loss 20 to 30  pounds, also joint pains and multiple vitamin deficiencies; concern for IBD versus celiac disease vs less likely IBS 2.  Hematochezia: Occasional episodes blamed on fissures in the past, nothing recently, see above 3.  History of anal fissure: Declined rectal exam today 4.  Nausea: Off-and-on per the patient, often hinders her appetite; can that her relation to possible Crohn's disease +/- gastritis +/- H. pylori 5.  Status postcholecystectomy: In December 2024, could be contributing to diarrhea if other etiologies are not found during colonoscopy could consider cholestyramine 6.  Weight loss: 20 to 30 pounds at times when she is feeling sick with GI symptoms; likely due to decreased appetite from all of the above 7.  Vitamin D /B12/iron deficiency: Remains deficient regardless of supplementation 8.  Diarrhea: Cannot recall the last time she had a solid stool; again concern for IBD  Plan: 1.  Given patient's extensive GI issues went ahead and scheduled her for EGD and colonoscopy at the hospital given her BMI greater than 50.  These were scheduled Dr. Avram as he had soonest availability.  Did provide the patient a detailed list of risks for the procedures and she agrees to proceed. 2.  Repeat labs today including CBC, CMP, ESR, CRP, iron studies with ferritin, B12 and folate, IgA anti-tTG, vitamin D , fecal calprotectin, lactoferrin and GI profile panel.  (At time of charting patient's vitamin D  is still noted to be low at 16.24, but much improved from 2 months ago, she is on vitamin D  already, ferritin remains low at 5.4 today-will recommend that she be referred to hematology for Feraheme infusions.  ESR is minimally elevated at 34, CRP normal.) 3.  Patient prescribed Hyoscyamine sulfate 0.125 mg sublingual tabs every 4-6 hours as needed for abdominal cramping #30 with 2 refills. 4.  Patient to follow in clinic per recommendations after time of procedures.   Delon Failing, PA-C [facility] 08/09/2023, 2:01 PM  Cc: Lendia Boby CROME, NP-C    Primary gastroenterologist-new patient for me:  Patient with chronic abdominal pain iron deficiency anemia which could be GI blood loss though has menorrhagia which is more likely.  Lab Results  Component Value Date   WBC 11.3 (H) 08/09/2023   HGB 12.5 08/09/2023   HCT 39.2 08/09/2023   MCV 78.3 08/09/2023   PLT 318.0 08/09/2023     Chemistry      Component Value Date/Time   NA 137 08/09/2023 1448   NA 139 02/09/2018 1015   K 3.8 08/09/2023 1448   CL 101 08/09/2023 1448   CO2 28 08/09/2023 1448   BUN 6 08/09/2023 1448   BUN 9 02/09/2018 1015   CREATININE 0.66 08/09/2023 1448      Component Value Date/Time   CALCIUM 9.0 08/09/2023 1448   ALKPHOS 71 08/09/2023 1448   AST 8 08/09/2023 1448   ALT 6 08/09/2023 1448   BILITOT 0.5 08/09/2023 1448   BILITOT 0.4 02/09/2018 1015     Celiac test negative tissue transglutaminase antibody and IgA.  B12 and folate are normal.  Still iron deficient.  Lab Results  Component Value Date   FERRITIN 5.4 (L) 08/09/2023   ESR 34  Notes she had a positive norovirus GI pathogen panel in May.  That may still be positive given potential persistence witH PCR testing but I would not think that is playing an active role.  Lupita CHARLENA Commander, MD, [facility]

## 2023-08-09 NOTE — Patient Instructions (Signed)
 Your provider has requested that you go to the basement level for lab work before leaving today. Press "B" on the elevator. The lab is located at the first door on the left as you exit the elevator.  You have been scheduled for an endoscopy and colonoscopy. Please follow the written instructions given to you at your visit today.  If you use inhalers (even only as needed), please bring them with you on the day of your procedure.  DO NOT TAKE 7 DAYS PRIOR TO TEST- Trulicity (dulaglutide) Ozempic, Wegovy (semaglutide) Mounjaro (tirzepatide) Bydureon Bcise (exanatide extended release)  DO NOT TAKE 1 DAY PRIOR TO YOUR TEST Rybelsus (semaglutide) Adlyxin (lixisenatide) Victoza (liraglutide) Byetta (exanatide) ___________________________________________________________________________

## 2023-08-10 ENCOUNTER — Other Ambulatory Visit: Payer: Self-pay

## 2023-08-10 DIAGNOSIS — D509 Iron deficiency anemia, unspecified: Secondary | ICD-10-CM

## 2023-08-10 LAB — TISSUE TRANSGLUTAMINASE ABS,IGG,IGA
(tTG) Ab, IgA: <1 U/mL
(tTG) Ab, IgG: <1 U/mL

## 2023-08-10 LAB — IGA: Immunoglobulin A: 227 mg/dL (ref 47–310)

## 2023-08-11 ENCOUNTER — Other Ambulatory Visit

## 2023-08-11 DIAGNOSIS — R1084 Generalized abdominal pain: Secondary | ICD-10-CM

## 2023-08-11 DIAGNOSIS — D509 Iron deficiency anemia, unspecified: Secondary | ICD-10-CM

## 2023-08-11 DIAGNOSIS — R634 Abnormal weight loss: Secondary | ICD-10-CM

## 2023-08-11 DIAGNOSIS — R197 Diarrhea, unspecified: Secondary | ICD-10-CM

## 2023-08-11 DIAGNOSIS — K921 Melena: Secondary | ICD-10-CM

## 2023-08-12 LAB — FECAL LACTOFERRIN, QUANT
Fecal Lactoferrin: NEGATIVE
MICRO NUMBER:: 16712297
SPECIMEN QUALITY:: ADEQUATE

## 2023-08-14 LAB — GI PROFILE, STOOL, PCR

## 2023-08-14 LAB — CALPROTECTIN, FECAL: Calprotectin, Fecal: 7 ug/g (ref 0–120)

## 2023-08-19 DIAGNOSIS — F419 Anxiety disorder, unspecified: Secondary | ICD-10-CM | POA: Diagnosis not present

## 2023-08-19 DIAGNOSIS — F909 Attention-deficit hyperactivity disorder, unspecified type: Secondary | ICD-10-CM | POA: Diagnosis not present

## 2023-08-26 ENCOUNTER — Telehealth: Admitting: Physician Assistant

## 2023-08-26 DIAGNOSIS — B379 Candidiasis, unspecified: Secondary | ICD-10-CM

## 2023-08-26 DIAGNOSIS — J340 Abscess, furuncle and carbuncle of nose: Secondary | ICD-10-CM | POA: Diagnosis not present

## 2023-08-26 DIAGNOSIS — T3695XA Adverse effect of unspecified systemic antibiotic, initial encounter: Secondary | ICD-10-CM | POA: Diagnosis not present

## 2023-08-26 MED ORDER — FLUCONAZOLE 150 MG PO TABS: 150.0000 mg | ORAL_TABLET | ORAL | 0 refills | Status: DC | PRN

## 2023-08-26 MED ORDER — SULFAMETHOXAZOLE-TRIMETHOPRIM 800-160 MG PO TABS: 1.0000 | ORAL_TABLET | Freq: Two times a day (BID) | ORAL | 0 refills | Status: DC

## 2023-08-26 NOTE — Progress Notes (Signed)
 E-Visit for Cellulitis  We are sorry that you are not feeling well. Here is how we plan to help!  Based on what you shared with me it looks like you have cellulitis.  Cellulitis looks like areas of skin redness, swelling, and warmth; it develops as a result of bacteria entering under the skin. Little red spots and/or bleeding can be seen in skin, and tiny surface sacs containing fluid can occur. Fever can be present. Cellulitis is almost always on one side of a body, and the lower limbs are the most common site of involvement.   I have prescribed:  Bactrim  DS 1 tablet by mouth twice a day for 7 days  Diflucan given as prophylaxis as patient tends to get vaginal yeast infections with antibiotic use.  HOME CARE:  Take your medications as ordered and take all of them, even if the skin irritation appears to be healing.   GET HELP RIGHT AWAY IF:  Symptoms that don't begin to go away within 48 hours. Severe redness persists or worsens If the area turns color, spreads or swells. If it blisters and opens, develops yellow-brown crust or bleeds. You develop a fever or chills. If the pain increases or becomes unbearable.  Are unable to keep fluids and food down.  MAKE SURE YOU   Understand these instructions. Will watch your condition. Will get help right away if you are not doing well or get worse.  Thank you for choosing an e-visit.  Your e-visit answers were reviewed by a board certified advanced clinical practitioner to complete your personal care plan. Depending upon the condition, your plan could have included both over the counter or prescription medications.  Please review your pharmacy choice. Make sure the pharmacy is open so you can pick up prescription now. If there is a problem, you may contact your provider through [employer] and have the prescription routed to another pharmacy.  Your safety is important to us . If you have drug allergies check your prescription carefully.    For the next 24 hours you can use MyChart to ask questions about today's visit, request a non-urgent call back, or ask for a work or school excuse. You will get an email in the next two days asking about your experience. I hope that your e-visit has been valuable and will speed your recovery.    I have spent 5 minutes in review of e-visit questionnaire, review and updating patient chart, medical decision making and response to patient.   Delon CHRISTELLA Dickinson, PA-C

## 2023-09-03 DIAGNOSIS — F413 Other mixed anxiety disorders: Secondary | ICD-10-CM | POA: Diagnosis not present

## 2023-09-04 ENCOUNTER — Other Ambulatory Visit: Payer: Self-pay | Admitting: Family Medicine

## 2023-09-05 NOTE — Telephone Encounter (Signed)
 Do you want to send in another round? Looks like vit D was just taken at another office and still fairly low

## 2023-09-06 ENCOUNTER — Encounter: Payer: Self-pay | Admitting: Family Medicine

## 2023-09-08 ENCOUNTER — Other Ambulatory Visit: Payer: Self-pay

## 2023-09-08 ENCOUNTER — Emergency Department (HOSPITAL_COMMUNITY)

## 2023-09-08 ENCOUNTER — Emergency Department (HOSPITAL_COMMUNITY)
Admission: EM | Admit: 2023-09-08 | Discharge: 2023-09-08 | Disposition: A | Discharge status: Home / Self Care | Attending: Emergency Medicine | Admitting: Emergency Medicine

## 2023-09-08 ENCOUNTER — Telehealth: Payer: Self-pay | Admitting: Physician Assistant

## 2023-09-08 ENCOUNTER — Telehealth: Admitting: Physician Assistant

## 2023-09-08 DIAGNOSIS — R350 Frequency of micturition: Secondary | ICD-10-CM | POA: Insufficient documentation

## 2023-09-08 DIAGNOSIS — Z91040 Latex allergy status: Secondary | ICD-10-CM | POA: Insufficient documentation

## 2023-09-08 DIAGNOSIS — R197 Diarrhea, unspecified: Secondary | ICD-10-CM | POA: Diagnosis not present

## 2023-09-08 DIAGNOSIS — Z9049 Acquired absence of other specified parts of digestive tract: Secondary | ICD-10-CM | POA: Diagnosis not present

## 2023-09-08 DIAGNOSIS — R112 Nausea with vomiting, unspecified: Secondary | ICD-10-CM | POA: Insufficient documentation

## 2023-09-08 DIAGNOSIS — R1084 Generalized abdominal pain: Secondary | ICD-10-CM | POA: Insufficient documentation

## 2023-09-08 DIAGNOSIS — L03211 Cellulitis of face: Secondary | ICD-10-CM

## 2023-09-08 DIAGNOSIS — R109 Unspecified abdominal pain: Secondary | ICD-10-CM | POA: Diagnosis not present

## 2023-09-08 LAB — URINALYSIS, ROUTINE W REFLEX MICROSCOPIC
Bilirubin Urine: NEGATIVE
Glucose, UA: NEGATIVE mg/dL
Hgb urine dipstick: NEGATIVE
Ketones, ur: NEGATIVE mg/dL
Leukocytes,Ua: NEGATIVE
Nitrite: NEGATIVE
Protein, ur: NEGATIVE mg/dL
Specific Gravity, Urine: 1.006 (ref 1.005–1.030)
pH: 7 (ref 5.0–8.0)

## 2023-09-08 LAB — HCG, SERUM, QUALITATIVE: Preg, Serum: NEGATIVE

## 2023-09-08 LAB — COMPREHENSIVE METABOLIC PANEL WITH GFR
ALT: 9 U/L (ref 0–44)
AST: 12 U/L — ABNORMAL LOW (ref 15–41)
Albumin: 3.1 g/dL — ABNORMAL LOW (ref 3.5–5.0)
Alkaline Phosphatase: 68 U/L (ref 38–126)
Anion gap: 13 (ref 5–15)
BUN: 5 mg/dL — ABNORMAL LOW (ref 6–20)
CO2: 24 mmol/L (ref 22–32)
Calcium: 9.2 mg/dL (ref 8.9–10.3)
Chloride: 101 mmol/L (ref 98–111)
Creatinine, Ser: 0.55 mg/dL (ref 0.44–1.00)
GFR, Estimated: >60 mL/min (ref >60)
Glucose, Bld: 100 mg/dL — ABNORMAL HIGH (ref 70–99)
Potassium: 3.9 mmol/L (ref 3.5–5.1)
Sodium: 138 mmol/L (ref 135–145)
Total Bilirubin: 0.4 mg/dL (ref 0.0–1.2)
Total Protein: 7.7 g/dL (ref 6.5–8.1)

## 2023-09-08 LAB — CBC WITH DIFFERENTIAL/PLATELET
Abs Immature Granulocytes: 0.04 K/uL (ref 0.00–0.07)
Basophils Absolute: 0.1 K/uL (ref 0.0–0.1)
Basophils Relative: 1 %
Eosinophils Absolute: 0.1 K/uL (ref 0.0–0.5)
Eosinophils Relative: 1 %
HCT: 41.8 % (ref 36.0–46.0)
Hemoglobin: 12.6 g/dL (ref 12.0–15.0)
Immature Granulocytes: 0 %
Lymphocytes Relative: 28 %
Lymphs Abs: 2.5 K/uL (ref 0.7–4.0)
MCH: 24.9 pg — ABNORMAL LOW (ref 26.0–34.0)
MCHC: 30.1 g/dL (ref 30.0–36.0)
MCV: 82.6 fL (ref 80.0–100.0)
Monocytes Absolute: 0.6 K/uL (ref 0.1–1.0)
Monocytes Relative: 7 %
Neutro Abs: 5.7 K/uL (ref 1.7–7.7)
Neutrophils Relative %: 63 %
Platelets: 383 K/uL (ref 150–400)
RBC: 5.06 MIL/uL (ref 3.87–5.11)
RDW: 14.2 % (ref 11.5–15.5)
WBC: 8.9 K/uL (ref 4.0–10.5)
nRBC: 0 % (ref 0.0–0.2)

## 2023-09-08 LAB — LIPASE, BLOOD: Lipase: 26 U/L (ref 11–51)

## 2023-09-08 MED ORDER — DICYCLOMINE HCL 20 MG PO TABS: 20.0000 mg | ORAL_TABLET | Freq: Three times a day (TID) | ORAL | 0 refills | Status: DC

## 2023-09-08 MED ORDER — IOHEXOL 300 MG/ML  SOLN
100.0000 mL | Freq: Once | INTRAMUSCULAR | Status: AC | PRN
  Administered 2023-09-08: 100 mL via INTRAVENOUS

## 2023-09-08 MED ORDER — ONDANSETRON HCL 4 MG/2ML IJ SOLN
4.0000 mg | Freq: Once | INTRAMUSCULAR | Status: AC
  Administered 2023-09-08: 4 mg via INTRAVENOUS
  Filled 2023-09-08: qty 2

## 2023-09-08 MED ORDER — MORPHINE SULFATE (PF) 4 MG/ML IV SOLN
4.0000 mg | Freq: Once | INTRAVENOUS | Status: AC
  Administered 2023-09-08: 4 mg via INTRAVENOUS
  Filled 2023-09-08: qty 1

## 2023-09-08 MED ORDER — ONDANSETRON 4 MG PO TBDP: 4.0000 mg | ORAL_TABLET | Freq: Four times a day (QID) | ORAL | 0 refills | Status: AC | PRN

## 2023-09-08 MED ORDER — SODIUM CHLORIDE 0.9 % IV BOLUS
1000.0000 mL | Freq: Once | INTRAVENOUS | Status: AC
  Administered 2023-09-08: 1000 mL via INTRAVENOUS

## 2023-09-08 NOTE — Telephone Encounter (Signed)
 Called patient back. Relayed provider message that these symptoms warrant an ER visit for possible bowel obstruction. Patient verbalized understanding and agrees to go be evaluated.

## 2023-09-08 NOTE — Medical Student Note (Incomplete)
 [facility]-EMERGENCY DEPT Provider Student Note For educational purposes for Medical, PA and NP students only and not part of the legal medical record.   CSN: [id number] Arrival date & time: 09/08/23  1504      History   Chief Complaint Chief Complaint  Patient presents with   Abdominal Pain    HPI Amanda Wilson is a 33 y.o. female w/ a PMH of PCOS, cholecystectomy, and undiagnosed GI issues presenting today w/ CC of abdominal pain. The patient reports having GI symptoms x 1 year; sees a GI provider who suspects Crohn's Disease. Pt has colonoscopy + EGD scheduled for September. Patient endorses bloating, difficulty defecating / passing gas, and generalized abdominal pain that feels like a balloon is blowing up for past 5 days. If she does defecate, it is only a small amount of diarrhea and/or mucus. She also endorses urinary frequency and pressure when urinating x 5 days. Nausea w/ one episode of vomiting yesterday. Intermittently feverish; Tmax 100F. No blood in urine, stool, or vomit. Denies ovarian cysts or history of cyst rupture. Patient is a frequent user of antibiotics d/t recurrent facial cellulitis; 7-day course of Bactrim  finished Sunday (08/10). Patient unsure if this feels different or is a part of her chronic issues.    Abdominal Pain   Past Medical History:  Diagnosis Date   ADHD (attention deficit hyperactivity disorder)    Allergy     Anal fissure    Anemia    Anxiety    Complication of anesthesia    said nerve block didn't work on finger   COVID-19    early February   Depression    Family history of adverse reaction to anesthesia    Father- slow to awaken also- father had part of lung remved for an Empyema.   Frequent headaches    migraines- maybe 1 once a month since starting to take Topmax on a daily basis   Gallstones    Neuromuscular disorder (HCC)    nerve pain in right small finger after surgery   Obesity    Pneumonia    as a  child    Patient  Active Problem List   Diagnosis Date Noted   Allergic contact dermatitis 08/05/2023   Situational anxiety 06/10/2023   Postprandial abdominal bloating 06/10/2023   Diarrhea 06/10/2023   Nausea 06/10/2023   Seasonal and perennial allergic rhinitis 05/06/2023   Recurrent infections 05/06/2023   Iron deficiency anemia due to chronic blood loss 12/13/2022   Vitamin D  deficiency 12/13/2022   Severe obesity (BMI >= 40) (HCC) 12/13/2022   Menorrhagia with regular cycle 12/13/2022   ADHD 05/11/2022   Dysmenorrhea 06/24/2016   Anxiety and depression 06/24/2016   Hirsutism 07/17/2013   Obesity 02/06/2013   Family history of PCOS 01/30/2013   Hair thinning 01/30/2013   Acne 05/12/2012    Past Surgical History:  Procedure Laterality Date   CHOLECYSTECTOMY N/A 01/11/2023   Procedure: LAPAROSCOPIC CHOLECYSTECTOMY WITH INTRAOPERATIVE CHOLANGIOGRAM;  Surgeon: Belinda Cough, MD;  Location: [facility] OR;  Service: General;  Laterality: N/A;  RNFA Assist   HARDWARE REMOVAL Right 05/05/2020   Procedure: RIGHT HAND HARDWARE REMOVAL AND SMALL FINGER METACARPOPHALANGEAL JOINT RELEASE;  Surgeon: Sebastian Lenis, MD;  Location: [facility] OR;  Service: Orthopedics;  Laterality: Right;   OPEN REDUCTION INTERNAL FIXATION (ORIF) PROXIMAL PHALANX Right 11/05/2019   Procedure: OPEN TREATMENT OF RIGHT RING FINGER FRACTURE AND 5TH METACARPAL FRACTURE;  Surgeon: Sebastian Lenis, MD;  Location: [facility] OR;  Service: Orthopedics;  Laterality: Right;  LENGTH OF SURGERY: 90 MIN  PRE OP BLOCK    OB History   No obstetric history on file.      Home Medications    Prior to Admission medications   Medication Sig Start Date End Date Taking? Authorizing Provider  amphetamine -dextroamphetamine  (ADDERALL XR) 30 MG 24 hr capsule Take 30 mg by mouth every morning. 06/03/23   [provider]  cloNIDine (CATAPRES) 0.1 MG tablet Take 0.1 mg by mouth at bedtime. 06/04/23   [provider]  Dermatological Products, Misc.  (DERMAREST ROSACEA EX) Apply 1 Application topically in the morning and at bedtime. Triple rosacea cream (azelaic acid/ivermectin/metronidazole)    [provider]  drospirenone-ethinyl estradiol  (YAZ) 3-0.02 MG tablet Take 1 tablet by mouth daily. 04/20/23   [provider]  Ferrous Sulfate (IRON PO) Take by mouth.    [provider]  fluconazole  (DIFLUCAN ) 150 MG tablet Take 1 tablet (150 mg total) by mouth every 3 (three) days as needed. 08/26/23   Vivienne Delon HERO, PA-C  FLUoxetine  (PROZAC ) 20 MG capsule Take 20 mg by mouth every morning. 07/20/23   [provider]  MELATONIN PO Take 5 mg by mouth at bedtime.    [provider]  montelukast  (SINGULAIR ) 10 MG tablet Take 1 tablet (10 mg total) by mouth at bedtime. 08/05/23   Ambs, Arlean HERO, FNP  NYSTATIN powder Apply topically as needed. 07/01/23   [provider]  Olopatadine-Mometasone (RYALTRIS ) 665-25 MCG/ACT SUSP Place 2 sprays into the nose 2 (two) times daily as needed. 05/06/23   Ambs, Arlean HERO, FNP  polyethylene glycol (MIRALAX / GLYCOLAX) 17 g packet Take 17 g by mouth in the morning. Patient taking differently: Take 17 g by mouth daily as needed for mild constipation.    [provider]  sulfamethoxazole -trimethoprim  (BACTRIM  DS) 800-160 MG tablet Take 1 tablet by mouth 2 (two) times daily. 08/26/23   Vivienne Delon HERO, PA-C  triamcinolone  (KENALOG ) 0.025 % ointment Apply 1 Application topically 2 (two) times daily. Patient taking differently: Apply 1 Application topically as needed. 04/28/23   Iva Marty Saltness, MD  Vitamin D , Ergocalciferol , (DRISDOL ) 1.25 MG (50000 UNIT) CAPS capsule Take 1 capsule (50,000 Units total) by mouth every 7 (seven) days. 06/15/23   Lendia Boby CROME, NP-C    Family History Family History  Problem Relation Age of Onset   Allergic rhinitis Mother    Cancer Mother        skin   Depression Mother    Anxiety disorder Mother    Arthritis Mother     Allergic rhinitis Father    Heart disease Father    Stroke Father    Diabetes Father    Atrial fibrillation Father    Allergic rhinitis Sister    Depression Sister    Anxiety disorder Sister    Other Sister        some form of celiac   Urticaria Brother    Eczema Brother    Asthma Brother    Allergic rhinitis Brother    Anxiety disorder Brother    Dementia Maternal Grandfather    Atrial fibrillation Paternal Grandfather    Depression Maternal Aunt    Anxiety disorder Maternal Aunt    Depression Cousin    Angioedema Neg Hx     Social History Social History   Tobacco Use   Smoking status: Never    Passive exposure: Never   Smokeless tobacco: Never  Vaping Use   Vaping status: Never Used  Substance Use Topics   Alcohol use: Not Currently   Drug use: Never     Allergies   Duricef [cefadroxil] and Latex   Review of Systems Review of Systems  Gastrointestinal:  Positive for abdominal pain.     Physical Exam Updated Vital Signs BP (!) 141/107   Pulse 100   Temp 98 F (36.7 C)   Resp 16   Ht 5' 4 (1.626 m)   SpO2 100%   BMI 52.52 kg/m   Physical Exam   ED Treatments / Results  Labs (all labs ordered are listed, but only abnormal results are displayed) Labs Reviewed  CBC WITH DIFFERENTIAL/PLATELET - Abnormal; Notable for the following components:      Result Value   MCH 24.9 (*)    All other components within normal limits  COMPREHENSIVE METABOLIC PANEL WITH GFR - Abnormal; Notable for the following components:   Glucose, Bld 100 (*)    BUN 5 (*)    Albumin 3.1 (*)    AST 12 (*)    All other components within normal limits  URINALYSIS, ROUTINE W REFLEX MICROSCOPIC - Abnormal; Notable for the following components:   Color, Urine STRAW (*)    All other components within normal limits  LIPASE, BLOOD  HCG, SERUM, QUALITATIVE    EKG  Radiology No results found.  Procedures Procedures (including critical care time)  Medications Ordered  in ED Medications  iohexol  (OMNIPAQUE ) 300 MG/ML solution 100 mL (100 mLs Intravenous Contrast Given 09/08/23 1707)     Initial Impression / Assessment and Plan / ED Course  I have reviewed the triage vital signs and the nursing notes.  Pertinent labs & imaging results that were available during my care of the patient were reviewed by me and considered in my medical decision making (see chart for details).     ***  Final Clinical Impressions(s) / ED Diagnoses   Final diagnoses:  None    New Prescriptions New Prescriptions   No medications on file

## 2023-09-08 NOTE — ED Provider Notes (Signed)
  EMERGENCY DEPARTMENT AT [facility] Provider Note   CSN: [id number] Arrival date & time: 09/08/23  1504     Patient presents with: Abdominal Pain   Amanda Wilson is a 33 y.o. female.   Amanda Wilson is a 33 y.o. female w/ a PMH of PCOS, cholecystectomy, and undiagnosed GI issues presenting today with abdominal pain. The patient reports having GI symptoms x 1 year; had been following with Delon Failing with [facility] who suspects Crohn's Disease. Pt has colonoscopy + EGD scheduled for September. Patient endorses bloating, difficulty defecating / passing gas, and generalized abdominal pain that feels like a balloon is blowing up for past 5 days. If she does defecate, it is only a small amount of diarrhea and/or mucus. She reports this is a change from her norm which is typically 3-4 loose stools per day. She also endorses urinary frequency and pressure when urinating x 5 days. Nausea w/ one episode of vomiting yesterday.  No blood in urine, stool, or vomit. Denies ovarian cysts or history of cyst rupture. Patient is a frequent user of antibiotics d/t recurrent facial cellulitis; 7-day course of Bactrim  finished Sunday (08/10). Called and discussed these symptoms with GI clinic and sent in with concern for obstruction.  The history is provided by the patient and medical records.  Abdominal Pain Associated symptoms: constipation, nausea and vomiting   Associated symptoms: no chills, no dysuria, no fever, no vaginal bleeding and no vaginal discharge        Prior to Admission medications   Medication Sig Start Date End Date Taking? Authorizing Provider  amphetamine -dextroamphetamine  (ADDERALL XR) 30 MG 24 hr capsule Take 30 mg by mouth every morning. 06/03/23   [provider]  cloNIDine (CATAPRES) 0.1 MG tablet Take 0.1 mg by mouth at bedtime. 06/04/23   [provider]  Dermatological Products, Misc. (DERMAREST ROSACEA EX) Apply 1 Application  topically in the morning and at bedtime. Triple rosacea cream (azelaic acid/ivermectin/metronidazole)    [provider]  drospirenone-ethinyl estradiol  (YAZ) 3-0.02 MG tablet Take 1 tablet by mouth daily. 04/20/23   [provider]  Ferrous Sulfate (IRON PO) Take by mouth.    [provider]  fluconazole  (DIFLUCAN ) 150 MG tablet Take 1 tablet (150 mg total) by mouth every 3 (three) days as needed. 08/26/23   Vivienne Delon HERO, PA-C  FLUoxetine  (PROZAC ) 20 MG capsule Take 20 mg by mouth every morning. 07/20/23   [provider]  MELATONIN PO Take 5 mg by mouth at bedtime.    [provider]  montelukast  (SINGULAIR ) 10 MG tablet Take 1 tablet (10 mg total) by mouth at bedtime. 08/05/23   Ambs, Arlean HERO, FNP  NYSTATIN powder Apply topically as needed. 07/01/23   [provider]  Olopatadine-Mometasone (RYALTRIS ) 665-25 MCG/ACT SUSP Place 2 sprays into the nose 2 (two) times daily as needed. 05/06/23   Ambs, Arlean HERO, FNP  polyethylene glycol (MIRALAX / GLYCOLAX) 17 g packet Take 17 g by mouth in the morning. Patient taking differently: Take 17 g by mouth daily as needed for mild constipation.    [provider]  sulfamethoxazole -trimethoprim  (BACTRIM  DS) 800-160 MG tablet Take 1 tablet by mouth 2 (two) times daily. 08/26/23   Vivienne Delon HERO, PA-C  triamcinolone  (KENALOG ) 0.025 % ointment Apply 1 Application topically 2 (two) times daily. Patient taking differently: Apply 1 Application topically as needed. 04/28/23   Iva Marty Saltness, MD  Vitamin D , Ergocalciferol , (DRISDOL ) 1.25 MG (50000 UNIT)  CAPS capsule Take 1 capsule (50,000 Units total) by mouth every 7 (seven) days. 06/15/23   Lendia Boby CROME, NP-C    Allergies: Duricef [cefadroxil] and Latex    Review of Systems  Constitutional:  Negative for chills and fever.  Gastrointestinal:  Positive for abdominal pain, constipation, nausea and vomiting.  Genitourinary:  Positive for  frequency. Negative for dysuria, vaginal bleeding and vaginal discharge.  All other systems reviewed and are negative.   Updated Vital Signs BP (!) 141/107   Pulse 100   Temp 98 F (36.7 C)   Resp 16   Ht 5' 4 (1.626 m)   SpO2 100%   BMI 52.52 kg/m   Physical Exam Vitals and nursing note reviewed.  Constitutional:      General: She is not in acute distress.    Appearance: Normal appearance. She is well-developed. She is obese. She is not diaphoretic.  HENT:     Head: Normocephalic and atraumatic.  Eyes:     General:        Right eye: No discharge.        Left eye: No discharge.     Pupils: Pupils are equal, round, and reactive to light.  Cardiovascular:     Rate and Rhythm: Normal rate and regular rhythm.     Pulses: Normal pulses.     Heart sounds: Normal heart sounds.  Pulmonary:     Effort: Pulmonary effort is normal. No respiratory distress.     Breath sounds: Normal breath sounds. No wheezing or rales.     Comments: Respirations equal and unlabored, patient able to speak in full sentences, lungs clear to auscultation bilaterally  Abdominal:     General: Bowel sounds are normal. There is no distension.     Palpations: Abdomen is soft. There is no mass.     Tenderness: There is abdominal tenderness in the right lower quadrant, epigastric area, periumbilical area, suprapubic area and left lower quadrant. There is no guarding.     Comments: Abdomen soft, nondistended, nontender to palpation in all quadrants without guarding or peritoneal signs  Musculoskeletal:        General: No deformity.     Cervical back: Neck supple.  Skin:    General: Skin is warm and dry.     Capillary Refill: Capillary refill takes less than 2 seconds.  Neurological:     Mental Status: She is alert and oriented to person, place, and time.     Coordination: Coordination normal.     Comments: Speech is clear, able to follow commands Moves extremities without ataxia, coordination intact   Psychiatric:        Mood and Affect: Mood normal.        Behavior: Behavior normal.     (all labs ordered are listed, but only abnormal results are displayed) Labs Reviewed  CBC WITH DIFFERENTIAL/PLATELET - Abnormal; Notable for the following components:      Result Value   MCH 24.9 (*)    All other components within normal limits  COMPREHENSIVE METABOLIC PANEL WITH GFR - Abnormal; Notable for the following components:   Glucose, Bld 100 (*)    BUN 5 (*)    Albumin 3.1 (*)    AST 12 (*)    All other components within normal limits  URINALYSIS, ROUTINE W REFLEX MICROSCOPIC - Abnormal; Notable for the following components:   Color, Urine STRAW (*)    All other components within normal limits  LIPASE, BLOOD  HCG, SERUM,  QUALITATIVE    EKG: None  Radiology: CT ABDOMEN PELVIS W CONTRAST Result Date: 09/08/2023 CLINICAL DATA:  Abdominal pain. EXAM: CT ABDOMEN AND PELVIS WITH CONTRAST TECHNIQUE: Multidetector CT imaging of the abdomen and pelvis was performed using the standard protocol following bolus administration of intravenous contrast. RADIATION DOSE REDUCTION: This exam was performed according to the departmental dose-optimization program which includes automated exposure control, adjustment of the mA and/or kV according to patient size and/or use of iterative reconstruction technique. CONTRAST:  100mL OMNIPAQUE  IOHEXOL  300 MG/ML  SOLN COMPARISON:  None Available. FINDINGS: Lower chest: No acute abnormality. Hepatobiliary: No focal liver abnormality is seen. Status post cholecystectomy. No biliary dilatation. Pancreas: Unremarkable. No pancreatic ductal dilatation or surrounding inflammatory changes. Spleen: Normal in size without focal abnormality. Adrenals/Urinary Tract: Adrenal glands are unremarkable. Kidneys are normal, without renal calculi, focal lesion, or hydronephrosis. Bladder is unremarkable. Stomach/Bowel: Stomach is within normal limits. Appendix appears normal. No  evidence of bowel wall thickening, distention, or inflammatory changes. Vascular/Lymphatic: No significant vascular findings are present. No enlarged abdominal or pelvic lymph nodes. Reproductive: Uterus and bilateral adnexa are unremarkable. Other: No abdominal wall hernia or abnormality. No abdominopelvic ascites. Musculoskeletal: No acute or significant osseous findings. IMPRESSION: No definite abnormality seen in the abdomen or pelvis. Electronically Signed   By: Lynwood Landy Raddle M.D.   On: 09/08/2023 18:13     Procedures   Medications Ordered in the ED  iohexol  (OMNIPAQUE ) 300 MG/ML solution 100 mL (100 mLs Intravenous Contrast Given 09/08/23 1707)  ondansetron  (ZOFRAN ) injection 4 mg (4 mg Intravenous Given 09/08/23 1818)  sodium chloride  0.9 % bolus 1,000 mL (1,000 mLs Intravenous New Bag/Given 09/08/23 1819)  morphine  (PF) 4 MG/ML injection 4 mg (4 mg Intravenous Given 09/08/23 1819)                                    Medical Decision Making Risk Prescription drug management.   Patient presents to the ED with complaints of abdominal pain. Patient nontoxic appearing, in no apparent distress, vitals WNL. On exam patient tender to palpation throughout the central abdomen and in the right and left lower quadrants, no peritoneal signs. Will evaluate with labs and CT.   Ddx including but not limited to: Bowel obstruction, bowel wall inflammation, colitis, gastroenteritis, constipation, appendicitis, diverticulitis  Additional history obtained:  Additional history obtained from chart review & nursing note review.   Lab Tests:  I Ordered, viewed, and interpreted labs, which included:  CBC: No leukocytosis and normal hemoglobin CMP: No significant electrolyte derangements, normal renal and liver function Lipase: WNL UA: No hematuria, leukocytes or signs of infection Preg test: Negative  Imaging Studies ordered:  I ordered imaging studies which included CT abdomen pelvis, I  independently reviewed, formal radiology impression shows:  Without evidence of obstruction or other acute intra-abdominal pathology  ED Course:  I ordered IV fluid bolus, morphine  and Zofran    RE-EVAL: Pain is significantly improved, patient tolerating p.o. fluids  On repeat abdominal exam patient remains without peritoneal signs, low suspicion for cholecystitis, pancreatitis, diverticulitis, appendicitis, bowel obstruction/perforation,  PID, ectopic pregnancy, or other acute surgical process. Patient tolerating PO in the emergency department. Will discharge home with supportive measures. I discussed results, treatment plan, need for GI follow-up, and return precautions with the patient. Provided opportunity for questions, patient confirmed understanding and is in agreement with plan.   Portions of this note were generated  with Scientist, clinical (histocompatibility and immunogenetics). Dictation errors may occur despite best attempts at proofreading.        Final diagnoses:  Generalized abdominal pain    ED Discharge Orders          Ordered    ondansetron  (ZOFRAN -ODT) 4 MG disintegrating tablet  Every 6 hours PRN        09/08/23 1959    dicyclomine  (BENTYL ) 20 MG tablet  3 times daily before meals & bedtime        09/08/23 1959               Mykeisha Dysert F, PA-C 09/08/23 2135    Bernard Drivers, MD 09/10/23 [id number]

## 2023-09-08 NOTE — Telephone Encounter (Signed)
 Inbound call from patient stating for the last 4-5 days she has been having distension in her abdomen and feeling really full. States she has also had no appetite and has been unable to pass gas or have a bowel movement. Patient is concerned regarding symptoms and requesting a call back. Please advise, thank you

## 2023-09-08 NOTE — Telephone Encounter (Signed)
 Spoke with patient. She reports that from 08/20/23 patient had diarrhea type stool. This consisted of stool that ranged from watery brown, to yellow mucus. She reports on 09/03/23 she noted diarrhea, however, she had to strain and push it out. She reports since then she has abd distention with bloating. She reports she has to strain just to pass gas. Also reports SOB. Mild nausea. One episode of bending over and coughing up yellow burning stomach acid. Not able to eat much, can get liquids in. Stomach does gurgle frequently. Fells feverish, temp highest 99.3. Reports she has tried Gas-X, has been taking Miralax BID for the past 2 days, hot peppermint tea, and yoga. Nothing seems to help. Patient would like provider to know that when her gallbladder was removed, they told her she had adhesions in her abdomen.

## 2023-09-08 NOTE — Discharge Instructions (Addendum)
 Your lab work and CT today were overall reassuring.  Continue to follow-up with GI for ongoing evaluation of these abdominal issues.  Given that you are having some difficulty moving her bowels continue using MiraLAX twice daily once you are having regular bowel movements you can decrease it to once daily.  You can use prescribed Zofran  as needed for nausea and vomiting.  I have prescribed Bentyl  which can help with abdominal spasms and pain please discuss with your GI doctor to ensure this will impact your upcoming testing prior to starting this medication.

## 2023-09-08 NOTE — ED Provider Triage Note (Signed)
 Emergency Medicine Provider Triage Evaluation Note  Amanda Wilson , a 33 y.o. female  was evaluated in triage.  Pt complains of abd pain. Endorse pain x 2 days.  Pain both epigastric and lower abdomen, feeling nauseous, difficulty passing flatus or having BMs, and endorse increase urinary frequency.  Possible hx of Crohns.  Had prior cholecystectomy.    Review of Systems  Positive: As above Negative: As above  Physical Exam  BP (!) 141/107   Pulse 100   Temp 98 F (36.7 C)   Resp 16   Ht 5' 4 (1.626 m)   SpO2 100%   BMI 52.52 kg/m  Gen:   Awake, no distress   Resp:  Normal effort  MSK:   Moves extremities without difficulty  Other:    Medical Decision Making  Medically screening exam initiated at 3:19 PM.  Appropriate orders placed.  Amanda Wilson was informed that the remainder of the evaluation will be completed by another provider, this initial triage assessment does not replace that evaluation, and the importance of remaining in the ED until their evaluation is complete.  Possible SBO   Nivia Colon, PA-C 09/08/23 1520

## 2023-09-08 NOTE — ED Triage Notes (Signed)
 Patient c/o abdominal pain x 2 days. Patient report worsening lower quadrant abdominal pain today. Patient report nausea, denies vomiting. Patient denies chest pain. Patient denies fever.

## 2023-09-08 NOTE — Progress Notes (Signed)
  Because of recurring symptoms and the location of symptoms (facial) after recent antibiotics, I feel your condition warrants further evaluation and I recommend that you be seen in a face-to-face visit.   NOTE: There will be NO CHARGE for this E-Visit   If you are having a true medical emergency, please call 911.     For an urgent face to face visit, [facility] has multiple urgent care centers for your convenience.  Click the link below for the full list of locations and hours, walk-in wait times, appointment scheduling options and driving directions:  Urgent Care - [place], [place], [place], [place], [place], [place]  [place]     Your MyChart E-visit questionnaire answers were reviewed by a board certified advanced clinical practitioner to complete your personal care plan based on your specific symptoms.    Thank you for using e-Visits.

## 2023-09-09 ENCOUNTER — Ambulatory Visit: Admitting: Family Medicine

## 2023-09-09 ENCOUNTER — Telehealth: Payer: Self-pay | Admitting: Physician Assistant

## 2023-09-09 ENCOUNTER — Encounter: Payer: Self-pay | Admitting: Family Medicine

## 2023-09-09 VITALS — BP 134/86 | HR 80 | Temp 97.6°F | Ht 64.0 in | Wt 307.0 lb

## 2023-09-09 DIAGNOSIS — J340 Abscess, furuncle and carbuncle of nose: Secondary | ICD-10-CM

## 2023-09-09 MED ORDER — DOXYCYCLINE HYCLATE 100 MG PO TABS: 100.0000 mg | ORAL_TABLET | Freq: Two times a day (BID) | ORAL | 0 refills | Status: DC

## 2023-09-09 NOTE — Patient Instructions (Signed)
 Take the oral antibiotic with food  If you develop fever, chills, worsening swelling or pain over the weekend, please go to the emergency department or urgent care.  I will be in touch with the culture results  Please follow-up next week

## 2023-09-09 NOTE — Telephone Encounter (Signed)
OK to take doxycycline.

## 2023-09-09 NOTE — Telephone Encounter (Signed)
 Received call from patient, states ER Prescribed medication (doxycycline ), was told to ask to make sure if she was OK to take medication. Please review and advise.  Thank you

## 2023-09-09 NOTE — Telephone Encounter (Signed)
 Patient advised. Also advised she is okay to take dicyclomine  but not to take hyoscyamine if she is taking dicyclomine . She said she does not have hyoscyamine.

## 2023-09-09 NOTE — Telephone Encounter (Signed)
 Facial cellutlitis that failed treatment with Bactrim . She was sent to the ER yesterday due to her abdominal pain.  Please advise. Thanks

## 2023-09-09 NOTE — Progress Notes (Signed)
 Subjective:     Patient ID: Amanda Wilson, female    DOB: 1990-10-16, 33 y.o.   MRN: [id number]  Chief Complaint  Patient presents with   Follow-up    Thinks she is having flare ups of cellulitis on face, not sure if its due to immune system. Had it about a week ago and got medication (bactrum DS) for it but swelling came back.     HPI  Discussed the use of AI scribe software for clinical note transcription with the patient, who gave verbal consent to proceed.  History of Present Illness Amanda Wilson is a 33 year old female who presents with recurrent facial rash and cellulitis.  Recurrent nasal and facial cellulitis - Recurrent episodes of cellulitis localized to the nose, with the most recent episode occurring two weeks ago - Nasal symptoms include swelling, erythema, and induration, currently less severe than previous episodes - Facial swelling present, with associated tenderness and warmth - No history of MRSA infection - Initial improvement with a course of Bactrim , but symptoms recurred shortly after completion - Daily use of Hibiclens  and hydrochlorophus acid spray for infection control, as initial diagnosis was staph infection  Facial swelling and headache - Persistent facial swelling, particularly involving the nose and forehead - Associated headache, attributed to facial and forehead swelling - Swelling has been more severe in the past  Localized acne flares - Recurrent acne in a single facial location, prone to flaring if disturbed - Avoids touching face to prevent exacerbation of symptoms       Health Maintenance Due  Topic Date Due   HIV Screening  Never done   Hepatitis C Screening  Never done   Cervical Cancer Screening (HPV/Pap Cotest)  Never done   INFLUENZA VACCINE  08/26/2023    Past Medical History:  Diagnosis Date   ADHD (attention deficit hyperactivity disorder)    Allergy     Anal fissure    Anemia    Anxiety    Complication of  anesthesia    said nerve block didn't work on finger   COVID-19    early February   Depression    Family history of adverse reaction to anesthesia    Father- slow to awaken also- father had part of lung remved for an Empyema.   Frequent headaches    migraines- maybe 1 once a month since starting to take Topmax on a daily basis   Gallstones    Neuromuscular disorder (HCC)    nerve pain in right small finger after surgery   Obesity    Pneumonia    as a  child    Past Surgical History:  Procedure Laterality Date   CHOLECYSTECTOMY N/A 01/11/2023   Procedure: LAPAROSCOPIC CHOLECYSTECTOMY WITH INTRAOPERATIVE CHOLANGIOGRAM;  Surgeon: Belinda Cough, MD;  Location: [facility] OR;  Service: General;  Laterality: N/A;  RNFA Assist   HARDWARE REMOVAL Right 05/05/2020   Procedure: RIGHT HAND HARDWARE REMOVAL AND SMALL FINGER METACARPOPHALANGEAL JOINT RELEASE;  Surgeon: Sebastian Lenis, MD;  Location: [facility] OR;  Service: Orthopedics;  Laterality: Right;   OPEN REDUCTION INTERNAL FIXATION (ORIF) PROXIMAL PHALANX Right 11/05/2019   Procedure: OPEN TREATMENT OF RIGHT RING FINGER FRACTURE AND 5TH METACARPAL FRACTURE;  Surgeon: Sebastian Lenis, MD;  Location: [facility] OR;  Service: Orthopedics;  Laterality: Right;  LENGTH OF SURGERY: 90 MIN  PRE OP BLOCK    Family History  Problem Relation Age of Onset   Allergic rhinitis Mother    Cancer Mother  skin   Depression Mother    Anxiety disorder Mother    Arthritis Mother    Allergic rhinitis Father    Heart disease Father    Stroke Father    Diabetes Father    Atrial fibrillation Father    Allergic rhinitis Sister    Depression Sister    Anxiety disorder Sister    Other Sister        some form of celiac   Urticaria Brother    Eczema Brother    Asthma Brother    Allergic rhinitis Brother    Anxiety disorder Brother    Dementia Maternal Grandfather    Atrial fibrillation Paternal Grandfather    Depression Maternal Aunt    Anxiety disorder Maternal  Aunt    Depression Cousin    Angioedema Neg Hx     Social History   Socioeconomic History   Marital status: Single    Spouse name: Not on file   Number of children: 0   Years of education: Not on file   Highest education level: Master's degree (e.g., MA, MS, MEng, MEd, MSW, MBA)  Occupational History   Not on file  Tobacco Use   Smoking status: Never    Passive exposure: Never   Smokeless tobacco: Never  Vaping Use   Vaping status: Never Used  Substance and Sexual Activity   Alcohol use: Not Currently   Drug use: Never   Sexual activity: Not Currently    Partners: Male    Comment: Nuvaring  Other Topics Concern   Not on file  Social History Narrative   Not on file   Social Drivers of Health   Financial Resource Strain: Low Risk  (09/08/2023)   Overall Financial Resource Strain (CARDIA)    Difficulty of Paying Living Expenses: Not very hard  Food Insecurity: No Food Insecurity (09/08/2023)   Hunger Vital Sign    Worried About Running Out of Food in the Last Year: Never true    Ran Out of Food in the Last Year: Never true  Transportation Needs: No Transportation Needs (09/08/2023)   PRAPARE - Administrator, Civil Service (Medical): No    Lack of Transportation (Non-Medical): No  Physical Activity: Inactive (09/08/2023)   Exercise Vital Sign    Days of Exercise per Week: 0 days    Minutes of Exercise per Session: Not on file  Stress: Stress Concern Present (09/08/2023)   [employer] of Occupational Health - Occupational Stress Questionnaire    Feeling of Stress: Rather much  Social Connections: Socially Isolated (09/08/2023)   Social Connection and Isolation Panel    Frequency of Communication with Friends and Family: More than three times a week    Frequency of Social Gatherings with Friends and Family: Once a week    Attends Religious Services: Never    Database administrator or Organizations: No    Attends Engineer, structural: Not on  file    Marital Status: Never married  Intimate Partner Violence: Not At Risk (12/10/2017)   Humiliation, Afraid, Rape, and Kick questionnaire    Fear of Current or Ex-Partner: No    Emotionally Abused: No    Physically Abused: No    Sexually Abused: No    Outpatient Medications Prior to Visit  Medication Sig Dispense Refill   amphetamine -dextroamphetamine  (ADDERALL XR) 30 MG 24 hr capsule Take 30 mg by mouth every morning.     cloNIDine (CATAPRES) 0.1 MG tablet Take 0.1 mg by  mouth at bedtime.     Dermatological Products, Misc. (DERMAREST ROSACEA EX) Apply 1 Application topically in the morning and at bedtime. Triple rosacea cream (azelaic acid/ivermectin/metronidazole)     dicyclomine  (BENTYL ) 20 MG tablet Take 1 tablet (20 mg total) by mouth 4 (four) times daily -  before meals and at bedtime. 20 tablet 0   drospirenone-ethinyl estradiol  (YAZ) 3-0.02 MG tablet Take 1 tablet by mouth daily.     Ferrous Sulfate (IRON PO) Take by mouth.     FLUoxetine  (PROZAC ) 20 MG capsule Take 20 mg by mouth every morning.     MELATONIN PO Take 5 mg by mouth at bedtime.     montelukast  (SINGULAIR ) 10 MG tablet Take 1 tablet (10 mg total) by mouth at bedtime. 30 tablet 5   NYSTATIN powder Apply topically as needed.     Olopatadine-Mometasone (RYALTRIS ) 665-25 MCG/ACT SUSP Place 2 sprays into the nose 2 (two) times daily as needed. 29 g 5   ondansetron  (ZOFRAN -ODT) 4 MG disintegrating tablet Take 1 tablet (4 mg total) by mouth every 6 (six) hours as needed for nausea or vomiting. 20 tablet 0   polyethylene glycol (MIRALAX / GLYCOLAX) 17 g packet Take 17 g by mouth in the morning. (Patient taking differently: Take 17 g by mouth daily as needed for mild constipation.)     triamcinolone  (KENALOG ) 0.025 % ointment Apply 1 Application topically 2 (two) times daily. 30 g 3   Vitamin D , Ergocalciferol , (DRISDOL ) 1.25 MG (50000 UNIT) CAPS capsule Take 1 capsule (50,000 Units total) by mouth every 7 (seven) days. 12  capsule 0   fluconazole  (DIFLUCAN ) 150 MG tablet Take 1 tablet (150 mg total) by mouth every 3 (three) days as needed. (Patient not taking: Reported on 09/09/2023) 3 tablet 0   sulfamethoxazole -trimethoprim  (BACTRIM  DS) 800-160 MG tablet Take 1 tablet by mouth 2 (two) times daily. 14 tablet 0   No facility-administered medications prior to visit.    Allergies  Allergen Reactions   Duricef [Cefadroxil]     Skin sloughing   Latex Rash    Review of Systems  Constitutional:  Negative for chills, fever and malaise/fatigue.  HENT:  Negative for congestion, ear pain and sore throat.   Eyes:  Negative for blurred vision, double vision and photophobia.  Respiratory:  Negative for cough and shortness of breath.   Cardiovascular:  Negative for chest pain, palpitations and leg swelling.  Gastrointestinal:  Negative for abdominal pain, constipation, diarrhea, nausea and vomiting.  Genitourinary:  Negative for dysuria, frequency and urgency.  Skin:  Positive for rash.  Neurological:  Negative for dizziness and focal weakness.       Objective:    Physical Exam Constitutional:      General: She is not in acute distress.    Appearance: She is not ill-appearing.  HENT:     Nose: Nasal tenderness present.     Right Sinus: No maxillary sinus tenderness or frontal sinus tenderness.     Left Sinus: No maxillary sinus tenderness or frontal sinus tenderness.      Comments: Erythema, firmness and TTP of distal nose to mid nose     Mouth/Throat:     Mouth: Mucous membranes are moist.     Pharynx: Oropharynx is clear.  Eyes:     Extraocular Movements: Extraocular movements intact.     Conjunctiva/sclera: Conjunctivae normal.  Cardiovascular:     Rate and Rhythm: Normal rate.  Pulmonary:     Effort: Pulmonary effort is normal.  Musculoskeletal:        General: Normal range of motion.     Cervical back: Normal range of motion and neck supple. No tenderness.  Lymphadenopathy:     Cervical: No  cervical adenopathy.  Skin:    General: Skin is warm and dry.     Findings: Rash present.  Neurological:     General: No focal deficit present.     Mental Status: She is alert and oriented to person, place, and time.     Motor: No weakness.     Coordination: Coordination normal.     Gait: Gait normal.  Psychiatric:        Mood and Affect: Mood normal.        Behavior: Behavior normal.        Thought Content: Thought content normal.      BP 134/86 (BP Location: Left Arm, Patient Position: Sitting)   Pulse 80   Temp 97.6 F (36.4 C) (Temporal)   Ht 5' 4 (1.626 m)   Wt (!) 307 lb (139.3 kg)   SpO2 98%   BMI 52.70 kg/m  Wt Readings from Last 3 Encounters:  09/09/23 (!) 307 lb (139.3 kg)  08/09/23 (!) 306 lb (138.8 kg)  08/05/23 (!) 305 lb 11.2 oz (138.7 kg)       Assessment & Plan:   Problem List Items Addressed This Visit   None Visit Diagnoses       Cellulitis of external nose    -  Primary   Relevant Orders   Wound culture      Assessment and Plan Assessment & Plan Recurrent facial cellulitis, primarily involving nose Recurrent episodes of cellulitis primarily affecting the nose, with recent exacerbation two weeks ago. Completed a course of Bactrim  with initial improvement, but symptoms returned shortly after completion. Current symptoms include swelling, redness, and tenderness, with some improvement today compared to yesterday. No MRSA hx. - Continue daily use of Hibiclens  and hydrochlorophus acid spray. - wound culture done -Doxycycline  ordered -follow up here on Monday and go to urgent care or ED if worsening over the weekend.   Recurrent acne of face Recurrent acne in a specific facial area, which exacerbates the cellulitis when manipulated. She is aware of the need to avoid touching the affected area and has implemented strategies to remind herself not to touch her face. - Continue to avoid manipulating acne lesions.  Abdominal pain, evaluated in  ED Recent evaluation in the ED for abdominal pain. CT scan and lab results, including white blood cell count, were normal. The cause of the abdominal pain remains unclear, and the pain persists without significant improvement.    I have discontinued Denecia Errico's sulfamethoxazole -trimethoprim . I am also having her start on doxycycline . Additionally, I am having her maintain her polyethylene glycol, MELATONIN PO, (Dermatological Products, Misc. (DERMAREST ROSACEA EX)), triamcinolone , Ryaltris , cloNIDine, amphetamine -dextroamphetamine , Ferrous Sulfate (IRON PO), drospirenone-ethinyl estradiol , Vitamin D  (Ergocalciferol ), FLUoxetine , nystatin, montelukast , fluconazole , ondansetron , and dicyclomine .  Meds ordered this encounter  Medications   doxycycline  (VIBRA -TABS) 100 MG tablet    Sig: Take 1 tablet (100 mg total) by mouth 2 (two) times daily.    Dispense:  20 tablet    Refill:  0    Supervising Provider:   ROLLENE NORRIS A [[id number]]

## 2023-09-12 ENCOUNTER — Ambulatory Visit: Payer: Self-pay | Admitting: Family Medicine

## 2023-09-12 ENCOUNTER — Ambulatory Visit: Admitting: Family Medicine

## 2023-09-12 ENCOUNTER — Encounter: Payer: Self-pay | Admitting: Family Medicine

## 2023-09-12 VITALS — BP 126/80 | HR 98 | Temp 98.0°F | Ht 64.0 in | Wt 309.4 lb

## 2023-09-12 DIAGNOSIS — R110 Nausea: Secondary | ICD-10-CM

## 2023-09-12 DIAGNOSIS — J340 Abscess, furuncle and carbuncle of nose: Secondary | ICD-10-CM | POA: Insufficient documentation

## 2023-09-12 LAB — WOUND CULTURE: RESULT:: NORMAL

## 2023-09-12 NOTE — Patient Instructions (Signed)
 Continue course of doxycycline .   Wound culture still pending.   May use zofran  as needed for nausea.   Follow-up with me for new or worsening symptoms.

## 2023-09-12 NOTE — Progress Notes (Signed)
 Acute Office Visit  Subjective:     Patient ID: Amanda Wilson, female    DOB: 06-16-1990, 33 y.o.   MRN: [id number]  Chief Complaint  Patient presents with   Follow-up    HPI  Discussed the use of AI scribe software for clinical note transcription with the patient, who gave verbal consent to proceed.  History of Present Illness Amanda Wilson is a 33 year old female with rosacea who presents with facial redness and nausea.  Facial erythema and acneiform lesions - Facial redness initially spread to the forehead but is now localized and less severe - A recurring acne-like lesion exacerbates the redness - Current redness is not tender - Facial swelling has decreased - Completing a course of doxycycline  started on Friday for rosacea  Nausea - Nausea present today - Zofran  prescribed for nausea and is effective - Has a 30-day supply of Zofran  for use as needed  Antibiotic-associated gastrointestinal symptoms - Significant gastrointestinal symptoms attributed to previous antibiotic treatments - Hospitalized on Thursday due to severe gastrointestinal illness - Received doxycycline  for three months, resulting in severe sickness - Switched to minocycline after adverse effects from doxycycline   Sun sensitivity precautions - Avoids sun exposure - Uses sunscreen regularly     ROS Per HPI      Objective:    BP 126/80 (BP Location: Left Arm, Patient Position: Sitting)   Pulse 98   Temp 98 F (36.7 C) (Temporal)   Ht 5' 4 (1.626 m)   Wt (!) 309 lb 6.4 oz (140.3 kg)   SpO2 98%   BMI 53.11 kg/m    Physical Exam Vitals and nursing note reviewed.  Constitutional:      General: She is not in acute distress.    Appearance: Normal appearance. She is normal weight.  HENT:     Head: Normocephalic and atraumatic.     Right Ear: External ear normal.     Left Ear: External ear normal.     Nose:      Comments: Area of erythema with central comedone. TTP, no  bleeding or discharge    Mouth/Throat:     Mouth: Mucous membranes are moist.     Pharynx: Oropharynx is clear.  Eyes:     Extraocular Movements: Extraocular movements intact.     Pupils: Pupils are equal, round, and reactive to light.  Cardiovascular:     Rate and Rhythm: Normal rate and regular rhythm.  Pulmonary:     Effort: Pulmonary effort is normal.  Musculoskeletal:        General: Normal range of motion.     Cervical back: Normal range of motion.     Right lower leg: No edema.     Left lower leg: No edema.  Lymphadenopathy:     Cervical: No cervical adenopathy.  Neurological:     General: No focal deficit present.     Mental Status: She is alert and oriented to person, place, and time.  Psychiatric:        Mood and Affect: Mood normal.        Thought Content: Thought content normal.     No results found for any visits on 09/12/23.      Assessment & Plan:   Assessment and Plan Assessment & Plan Facial cellulitis with underlying rosacea and recurrent acneiform eruption Condition improving with doxycycline , though causing nausea. Previous Bactrim  ineffective. Recurrent cellulitis and GI issues noted. - Continue doxycycline , complete 7-day course. - Consider extending to 2-week course  if symptoms recur. - Advise on sun sensitivity; recommend sun protection. - Encourage Zofran  for nausea as needed.  Nausea secondary to doxycycline  therapy Nausea due to doxycycline , especially without food. Zofran  effective. - Take doxycycline  with food. - Use Zofran  as needed.     No orders of the defined types were placed in this encounter.    No orders of the defined types were placed in this encounter.   Return if symptoms worsen or fail to improve.  Corean LITTIE Ku, FNP

## 2023-09-13 NOTE — Telephone Encounter (Signed)
 Per the visit w Corean yesterday, Continue doxycycline , complete 7-day course. - Consider extending to 2-week course if symptoms recur.  Does this report change anything or have pt continue the treatment of doxy?

## 2023-09-19 NOTE — Progress Notes (Unsigned)
 [facility]   Telephone:([phone]) [phone] Fax:([phone]) [phone]   Clinic New consult Note   Patient Care Team: Lendia Boby CROME, NP-C as PCP - General (Family Medicine) Ob/Gyn, Landy Stains 09/20/2023  CHIEF COMPLAINTS/PURPOSE OF CONSULTATION:  Iron deficiency anemia   History of preset illness Amanda Wilson is a 33 y.o. female with a history of menorrhagia, PCOS, morbid obesity, anxiety, and ADHD. She was referred from GI provider. She has had many years of experiencing a myriad of GI problems, including chronic diarrhea, anal fissures with rectal bleeding, and nausea with or without vomiting. She was found to have multiple gallstones during a work up of this pain with her primary care provider. The patient had gallbladder removal in 12/2023. The GI symptoms did not resolve, and she was referred to GI.  During her initial GI work up, the patient was found to be iron deficient. This was felt to be due to menorrhagia and diagnosis if PCOS. The patient is on oral contraceptive pill now, and has not had a heavy period in several months. A check of her ferritin on 02/09/2023 was 5.7. a recheck on 06/10/2023 showed her ferritin improved, only to 10. Throughout the lengthy work up, the patient's CBC has remained essentially normal, with low ferritin and low iron saturation %.  Her most recent labs, doen by her GI provider were done on 08/09/2023. She had a normal CBC. Her ferritin wsa 5.4. B12 level was low/normal at 295. Iron was 49, transferrin 357, saturation of 7.8%, and TIBC 499.8.  She had negative workup for celiac disease.  She is scheduled to have colonoscopy and EGD on 09/29/2023. The patient also sees GYN provider on routine basis. She was started on OCP with estrogen to try to improve heavy menstrual bleeding.  Previously, the patient was having extremely heavy menstrual cycles.  Was changing a nighttime, heavy day pad, 3-4 times per day with multiple clots.  This was happening even after  start of OCP.  In the past 6 weeks, GYN provider recommended patient try rapid cycling through placebo pills.  Patient has not had menstrual cycle over the past 6 weeks.   Patient is symptomatic with her iron deficiency.  Over the past 2 to 3 months, she has been experiencing shortness of breath, chest heaviness, and palpitations.  She is also noted weakness and dizziness upon standing.  Her colleagues have told her she appears pale.  There have been several days, especially during menstrual periods, when she did not have the ability to work or do anything else due to the symptoms she has been experiencing. She denies headaches. She has persistent nausea and diarrhea. She does experience blood in her stool. She also reports feeling moderately to severely fatigued.  Socially, the patient is single and lives alone with her cat. She works as a Paramedic for [employer]. She does not smoke. She denies drinking alcohol. She does not use illegal or illicit drugs.    She was not found to have abnormal CBC. Due to symptoms of severe fatigue, reports of menorrhagia and occasional blood in her stool, iron studies were run showing very low ferritin.  She has noted recent chest tightness on exertion, shortness of breath on minimal exertion, pre-syncopal episodes, and palpitations. She has noticed hematochezia. This has been ongoing problem since she was a teenager. She states that she was always told this was due to her obesity.  The patient denies over the counter NSAID ingestion. She state that she  would infrequently take these for instances of pain, but has stopped after recommendation from GI provider.  She has not had a colonoscopy, but she is scheduled for colonoscopy/EGD on 09/29/2023.  She had no prior history or diagnosis of cancer. Her age appropriate screening programs are up-to-date. She denies any pica and eats a variety of diet. She never donated blood or received blood transfusion The  patient was prescribed oral iron supplements and she takes iron supplement every day.    REVIEW OF SYSTEMS:   Constitutional: Denies fevers, chills or abnormal night sweats. Fatigue, which is severe at times.  Eyes: Denies blurriness of vision, double vision or watery eyes Ears, nose, mouth, throat, and face: Denies mucositis or sore throat Respiratory: Denies cough, dyspnea or wheezes Cardiovascular: she reports palpitations and chest tightness, especially during exertion. This is worse during the time of her menstrual cycles.  Gastrointestinal:  Denies nausea or heartburn. She has chronic nausea and diarrhea. She frequently notes blood in her stool.  Skin: Denies abnormal skin rashes Lymphatics: Denies new lymphadenopathy or easy bruising Neurological:Denies numbness, tingling or new weaknesses Behavioral/Psych: Mood is stable, no new changes   All other systems were reviewed with the patient and are negative.   MEDICAL HISTORY:  Past Medical History:  Diagnosis Date   ADHD (attention deficit hyperactivity disorder)    Allergy     Anal fissure    Anemia    Anxiety    Complication of anesthesia    said nerve block didn't work on finger   COVID-19    early February   Depression    Family history of adverse reaction to anesthesia    Father- slow to awaken also- father had part of lung remved for an Empyema.   Frequent headaches    migraines- maybe 1 once a month since starting to take Topmax on a daily basis   Gallstones    Neuromuscular disorder (HCC)    nerve pain in right small finger after surgery   Obesity    Pneumonia    as a  child    SURGICAL HISTORY: Past Surgical History:  Procedure Laterality Date   CHOLECYSTECTOMY N/A 01/11/2023   Procedure: LAPAROSCOPIC CHOLECYSTECTOMY WITH INTRAOPERATIVE CHOLANGIOGRAM;  Surgeon: Belinda Cough, MD;  Location: [facility] OR;  Service: General;  Laterality: N/A;  RNFA Assist   HARDWARE REMOVAL Right 05/05/2020   Procedure: RIGHT  HAND HARDWARE REMOVAL AND SMALL FINGER METACARPOPHALANGEAL JOINT RELEASE;  Surgeon: Sebastian Lenis, MD;  Location: [facility] OR;  Service: Orthopedics;  Laterality: Right;   OPEN REDUCTION INTERNAL FIXATION (ORIF) PROXIMAL PHALANX Right 11/05/2019   Procedure: OPEN TREATMENT OF RIGHT RING FINGER FRACTURE AND 5TH METACARPAL FRACTURE;  Surgeon: Sebastian Lenis, MD;  Location: [facility] OR;  Service: Orthopedics;  Laterality: Right;  LENGTH OF SURGERY: 90 MIN  PRE OP BLOCK    SOCIAL HISTORY: Social History   Socioeconomic History   Marital status: Single    Spouse name: Not on file   Number of children: 0   Years of education: Not on file   Highest education level: Master's degree (e.g., MA, MS, MEng, MEd, MSW, MBA)  Occupational History   Not on file  Tobacco Use   Smoking status: Never    Passive exposure: Never   Smokeless tobacco: Never  Vaping Use   Vaping status: Never Used  Substance and Sexual Activity   Alcohol use: Not Currently   Drug use: Never   Sexual activity: Not Currently    Partners: Male  Comment: Nuvaring  Other Topics Concern   Not on file  Social History Narrative   Not on file   Social Drivers of Health   Financial Resource Strain: Low Risk  (09/08/2023)   Overall Financial Resource Strain (CARDIA)    Difficulty of Paying Living Expenses: Not very hard  Food Insecurity: No Food Insecurity (09/20/2023)   Hunger Vital Sign    Worried About Running Out of Food in the Last Year: Never true    Ran Out of Food in the Last Year: Never true  Transportation Needs: No Transportation Needs (09/20/2023)   PRAPARE - Administrator, Civil Service (Medical): No    Lack of Transportation (Non-Medical): No  Physical Activity: Inactive (09/08/2023)   Exercise Vital Sign    Days of Exercise per Week: 0 days    Minutes of Exercise per Session: Not on file  Stress: Stress Concern Present (09/08/2023)   [employer] of Occupational Health - Occupational Stress  Questionnaire    Feeling of Stress: Rather much  Social Connections: Socially Isolated (09/08/2023)   Social Connection and Isolation Panel    Frequency of Communication with Friends and Family: More than three times a week    Frequency of Social Gatherings with Friends and Family: Once a week    Attends Religious Services: Never    Database administrator or Organizations: No    Attends Engineer, structural: Not on file    Marital Status: Never married  Intimate Partner Violence: Not At Risk (09/20/2023)   Humiliation, Afraid, Rape, and Kick questionnaire    Fear of Current or Ex-Partner: No    Emotionally Abused: No    Physically Abused: No    Sexually Abused: No    FAMILY HISTORY: Family History  Problem Relation Age of Onset   Allergic rhinitis Mother    Cancer Mother        skin   Depression Mother    Anxiety disorder Mother    Arthritis Mother    Allergic rhinitis Father    Heart disease Father    Stroke Father    Diabetes Father    Atrial fibrillation Father    Allergic rhinitis Sister    Depression Sister    Anxiety disorder Sister    Other Sister        some form of celiac   Urticaria Brother    Eczema Brother    Asthma Brother    Allergic rhinitis Brother    Anxiety disorder Brother    Dementia Maternal Grandfather    Atrial fibrillation Paternal Grandfather    Depression Maternal Aunt    Anxiety disorder Maternal Aunt    Depression Cousin    Angioedema Neg Hx     ALLERGIES:  is allergic to duricef [cefadroxil] and latex.  MEDICATIONS:  Current Outpatient Medications  Medication Sig Dispense Refill   amphetamine -dextroamphetamine  (ADDERALL XR) 30 MG 24 hr capsule Take 30 mg by mouth every morning.     cloNIDine (CATAPRES) 0.1 MG tablet Take 0.1 mg by mouth at bedtime.     Dermatological Products, Misc. (DERMAREST ROSACEA EX) Apply 1 Application topically in the morning and at bedtime. Triple rosacea cream (azelaic acid/ivermectin/metronidazole)      dicyclomine  (BENTYL ) 20 MG tablet Take 1 tablet (20 mg total) by mouth 4 (four) times daily -  before meals and at bedtime. 20 tablet 0   drospirenone-ethinyl estradiol  (YAZ) 3-0.02 MG tablet Take 1 tablet by mouth daily.  Ferrous Sulfate (IRON PO) Take by mouth.     fluconazole  (DIFLUCAN ) 150 MG tablet Take 1 tablet (150 mg total) by mouth every 3 (three) days as needed. 3 tablet 0   FLUoxetine  (PROZAC ) 20 MG capsule Take 20 mg by mouth every morning.     MELATONIN PO Take 5 mg by mouth at bedtime.     montelukast  (SINGULAIR ) 10 MG tablet Take 1 tablet (10 mg total) by mouth at bedtime. 30 tablet 5   NYSTATIN powder Apply topically as needed.     Olopatadine-Mometasone (RYALTRIS ) 665-25 MCG/ACT SUSP Place 2 sprays into the nose 2 (two) times daily as needed. 29 g 5   ondansetron  (ZOFRAN -ODT) 4 MG disintegrating tablet Take 1 tablet (4 mg total) by mouth every 6 (six) hours as needed for nausea or vomiting. 20 tablet 0   polyethylene glycol (MIRALAX / GLYCOLAX) 17 g packet Take 17 g by mouth in the morning. (Patient taking differently: Take 17 g by mouth daily as needed for mild constipation.)     triamcinolone  (KENALOG ) 0.025 % ointment Apply 1 Application topically 2 (two) times daily. 30 g 3   Vitamin D , Ergocalciferol , (DRISDOL ) 1.25 MG (50000 UNIT) CAPS capsule Take 1 capsule (50,000 Units total) by mouth every 7 (seven) days. 12 capsule 0   doxycycline  (VIBRA -TABS) 100 MG tablet Take 1 tablet (100 mg total) by mouth 2 (two) times daily. (Patient not taking: Reported on 09/20/2023) 20 tablet 0   No current facility-administered medications for this visit.    PHYSICAL EXAMINATION: ECOG PERFORMANCE STATUS: 1 - Symptomatic but completely ambulatory  Vitals:   09/20/23 1107  BP: 138/88  Pulse: (!) 101  Resp: 17  Temp: 98.3 F (36.8 C)  SpO2: 99%   Filed Weights   09/20/23 1107  Weight: (!) 308 lb 1.6 oz (139.8 kg)    GENERAL:alert, no distress and comfortable SKIN: skin  color, texture, turgor are normal, no rashes or significant lesions EYES: normal, conjunctiva are pink and non-injected, sclera clear OROPHARYNX:no exudate, no erythema and lips, buccal mucosa, and tongue normal  NECK: supple, thyroid  normal size, non-tender, without nodularity LYMPH:  no palpable lymphadenopathy in the cervical, axillary or inguinal LUNGS: clear to auscultation and percussion with normal breathing effort HEART: regular rate & rhythm and no murmurs and no lower extremity edema ABDOMEN:abdomen soft, non-tender and normal bowel sounds Musculoskeletal:no cyanosis of digits and no clubbing  PSYCH: alert & oriented x 3 with fluent speech NEURO: no focal motor/sensory deficits  LABORATORY DATA:  I have reviewed the data as listed    Latest Ref Rng & Units 09/08/2023    3:38 PM 08/09/2023    2:48 PM 06/10/2023    9:06 AM  CBC  WBC 4.0 - 10.5 K/uL 8.9  11.3  8.7   Hemoglobin 12.0 - 15.0 g/dL 87.3  87.4  87.9   Hematocrit 36.0 - 46.0 % 41.8  39.2  38.2   Platelets 150 - 400 K/uL 383  318.0  322.0        Latest Ref Rng & Units 09/08/2023    3:38 PM 08/09/2023    2:48 PM 06/10/2023    9:06 AM  CMP  Glucose 70 - 99 mg/dL 899  94  95   BUN 6 - 20 mg/dL 5  6  11    Creatinine 0.44 - 1.00 mg/dL 9.44  9.33  9.30   Sodium 135 - 145 mmol/L 138  137  139   Potassium 3.5 - 5.1 mmol/L  3.9  3.8  4.3   Chloride 98 - 111 mmol/L 101  101  103   CO2 22 - 32 mmol/L 24  28  27    Calcium 8.9 - 10.3 mg/dL 9.2  9.0  9.5   Total Protein 6.5 - 8.1 g/dL 7.7  7.9  7.8   Total Bilirubin 0.0 - 1.2 mg/dL 0.4  0.5  0.3   Alkaline Phos 38 - 126 U/L 68  71  68   AST 15 - 41 U/L 12  8  9    ALT 0 - 44 U/L 9  6  7       RADIOGRAPHIC STUDIES: CT ABDOMEN PELVIS W CONTRAST Result Date: 09/08/2023 CLINICAL DATA:  Abdominal pain. EXAM: CT ABDOMEN AND PELVIS WITH CONTRAST TECHNIQUE: Multidetector CT imaging of the abdomen and pelvis was performed using the standard protocol following bolus administration of  intravenous contrast. RADIATION DOSE REDUCTION: This exam was performed according to the departmental dose-optimization program which includes automated exposure control, adjustment of the mA and/or kV according to patient size and/or use of iterative reconstruction technique. CONTRAST:  OMNIPAQUE  IOHEXOL  300 MG/ML  SOLN COMPARISON:  None Available. FINDINGS: Lower chest: No acute abnormality. Hepatobiliary: No focal liver abnormality is seen. Status post cholecystectomy. No biliary dilatation. Pancreas: Unremarkable. No pancreatic ductal dilatation or surrounding inflammatory changes. Spleen: Normal in size without focal abnormality. Adrenals/Urinary Tract: Adrenal glands are unremarkable. Kidneys are normal, without renal calculi, focal lesion, or hydronephrosis. Bladder is unremarkable. Stomach/Bowel: Stomach is within normal limits. Appendix appears normal. No evidence of bowel wall thickening, distention, or inflammatory changes. Vascular/Lymphatic: No significant vascular findings are present. No enlarged abdominal or pelvic lymph nodes. Reproductive: Uterus and bilateral adnexa are unremarkable. Other: No abdominal wall hernia or abnormality. No abdominopelvic ascites. Musculoskeletal: No acute or significant osseous findings. IMPRESSION: No definite abnormality seen in the abdomen or pelvis. Electronically Signed   By: Lynwood Landy Raddle M.D.   On: 09/08/2023 18:13    Assessment and Plan Iron deficiency anemia due to chronic blood loss Assessment & Plan: This is a 33 y.o. female with a history of menorrhagia, PCOS, morbid obesity, anxiety, and ADHD. She was referred from GI provider. She has had many years of experiencing a myriad of GI problems, including chronic diarrhea, anal fissures with rectal bleeding, and nausea with or without vomiting. She was found to have multiple gallstones during a work up of this pain with her primary care provider. The patient had gallbladder removal in 12/2023. The  GI symptoms did not resolve, and she was referred to GI.  During her initial GI work up, the patient was found to be iron deficient. This was felt to be due to menorrhagia and diagnosis if PCOS. The patient is on oral contraceptive pill now, and has not had a heavy period in several months. A check of her ferritin on 02/09/2023 was 5.7. a recheck on 06/10/2023 showed her ferritin improved, only to 10. Throughout the lengthy work up, the patient's CBC has remained essentially normal, with low ferritin and low iron saturation %.  Her most recent labs, doen by her GI provider were done on 08/09/2023. She had a normal CBC. Her ferritin wsa 5.4. B12 level was low/normal at 295. Iron was 49, transferrin 357, saturation of 7.8%, and TIBC 499.8.  She had negative workup for celiac disease.  She is scheduled to have colonoscopy and EGD on 09/29/2023. The patient also sees GYN provider on routine basis. She was started on OCP  with estrogen to try to improve heavy menstrual bleeding.  Previously, the patient was having extremely heavy menstrual cycles.  Was changing a nighttime, heavy day pad, 3-4 times per day with multiple clots.  This was happening even after start of OCP.  In the past 6 weeks, GYN provider recommended patient try rapid cycling through placebo pills.  Patient has not had menstrual cycle over the past 6 weeks.   Discussed with the patient, most likely cause of her iron deficiency is chronic blood loss. Blood loss is from both GYN and GI sources. Oral iron has been ineffective thus far, imp[roving ferritin to only 10.0, after being on this for a year or longer. IV iron has been recommended. Her insurance does prefer [place]. Will order two infusions, separated by 2 weeks, to be done at [facility]. We discussed that side effects from IV iron are rare. Most negative reactions are allergic in nature. They span from generalized itchiness, to hives, to tongue swelling and sensation of throat  closing. I advised her that premedications are given, tylenol  650 mg and benadryl 25 mg, prior to administration. Together, these medications prevent many reactions. Plan to recheck her CBC, iron panel, and ferritin in approximately 6 weeks to determine need for additional treatment with IV iron.    Symptomatic anemia Assessment & Plan: Will treat with IV iron. Treatment plan for Fereheme 510 mg sent to [facility]. This should be given in 2 separate infusions, 2 weeks apart. Treatment plan has been sent to [facility]. They will contact the patient to set up infusion appointments. I plan to recheck the labs every 6 weeks and see her back for follow up in 3 months.    Other orders -     Acetaminophen  -     diphenhydrAMINE HCl -     ferumoxytol -     Famotidine in NaCl -     Sodium Chloride  -     methylPREDNISolone Sodium Succ -     diphenhydrAMINE HCl -     Albuterol Sulfate HFA -     EPINEPHrine      The patient was seen along with Dr. Lanny today. All questions were answered. The patient knows to call the clinic with any problems, questions or concerns. The total time spent in the appointment was 25 minutes.     Oskar Cretella  E [place], NP 09/20/2023 2:19 PM   Addendum I have seen the patient, examined her. I agree with the assessment and and plan and have edited the notes.   33 year old female with a history of menorrhagia, improved recently with birth control pill, morbid obesity, was referred by GYN for iron deficiency.  She is not anemic.  She has been taking oral iron for a year, her outside lab a month ago showed significant iron deficiency.  She has quite bit of fatigue, possible related to iron deficiency.  I recommend IV iron Feraheme 510 mg weekly x 2, to see if her symptom resolves/improves.  Will monitor her CBC and iron study a month after, then every 3 to 6 months.  All questions were answered.  Onita Lanny MD 09/20/2023

## 2023-09-20 ENCOUNTER — Inpatient Hospital Stay: Attending: Nurse Practitioner | Admitting: Nurse Practitioner

## 2023-09-20 ENCOUNTER — Telehealth: Payer: Self-pay

## 2023-09-20 ENCOUNTER — Inpatient Hospital Stay

## 2023-09-20 VITALS — BP 138/88 | HR 101 | Temp 98.3°F | Resp 17 | Wt 308.1 lb

## 2023-09-20 DIAGNOSIS — N920 Excessive and frequent menstruation with regular cycle: Secondary | ICD-10-CM | POA: Insufficient documentation

## 2023-09-20 DIAGNOSIS — E282 Polycystic ovarian syndrome: Secondary | ICD-10-CM | POA: Insufficient documentation

## 2023-09-20 DIAGNOSIS — D649 Anemia, unspecified: Secondary | ICD-10-CM | POA: Insufficient documentation

## 2023-09-20 DIAGNOSIS — D500 Iron deficiency anemia secondary to blood loss (chronic): Secondary | ICD-10-CM | POA: Insufficient documentation

## 2023-09-20 NOTE — Assessment & Plan Note (Signed)
 Will treat with IV iron. Treatment plan for Fereheme 510 mg sent to [facility]. This should be given in 2 separate infusions, 2 weeks apart. Treatment plan has been sent to [facility]. They will contact the patient to set up infusion appointments. I plan to recheck the labs every 6 weeks and see her back for follow up in 3 months.

## 2023-09-20 NOTE — Telephone Encounter (Signed)
 Heather , patient will be scheduled as soon as possible.  Auth Submission: NO AUTH NEEDED Site of care: Site of care: [facility] Payer: BCBS commercial and [place] medicaid Medication & CPT/J Code(s) submitted: Feraheme (ferumoxytol) [id number] Diagnosis Code:  Route of submission (phone, fax, portal):  Phone # Fax # Auth type: Buy/Bill PB Units/visits requested: 510mg  x 2 doses Reference number:  Approval from: 09/20/23 to 01/20/24

## 2023-09-20 NOTE — Assessment & Plan Note (Signed)
 This is a 33 y.o. female with a history of menorrhagia, PCOS, morbid obesity, anxiety, and ADHD. She was referred from GI provider. She has had many years of experiencing a myriad of GI problems, including chronic diarrhea, anal fissures with rectal bleeding, and nausea with or without vomiting. She was found to have multiple gallstones during a work up of this pain with her primary care provider. The patient had gallbladder removal in 12/2023. The GI symptoms did not resolve, and she was referred to GI.  During her initial GI work up, the patient was found to be iron deficient. This was felt to be due to menorrhagia and diagnosis if PCOS. The patient is on oral contraceptive pill now, and has not had a heavy period in several months. A check of her ferritin on 02/09/2023 was 5.7. a recheck on 06/10/2023 showed her ferritin improved, only to 10. Throughout the lengthy work up, the patient's CBC has remained essentially normal, with low ferritin and low iron saturation %.  Her most recent labs, doen by her GI provider were done on 08/09/2023. She had a normal CBC. Her ferritin wsa 5.4. B12 level was low/normal at 295. Iron was 49, transferrin 357, saturation of 7.8%, and TIBC 499.8.  She had negative workup for celiac disease.  She is scheduled to have colonoscopy and EGD on 09/29/2023. The patient also sees GYN provider on routine basis. She was started on OCP with estrogen to try to improve heavy menstrual bleeding.  Previously, the patient was having extremely heavy menstrual cycles.  Was changing a nighttime, heavy day pad, 3-4 times per day with multiple clots.  This was happening even after start of OCP.  In the past 6 weeks, GYN provider recommended patient try rapid cycling through placebo pills.  Patient has not had menstrual cycle over the past 6 weeks.   Discussed with the patient, most likely cause of her iron deficiency is chronic blood loss. Blood loss is from both GYN and GI sources. Oral iron has been  ineffective thus far, imp[roving ferritin to only 10.0, after being on this for a year or longer. IV iron has been recommended. Her insurance does prefer [place]. Will order two infusions, separated by 2 weeks, to be done at [facility]. We discussed that side effects from IV iron are rare. Most negative reactions are allergic in nature. They span from generalized itchiness, to hives, to tongue swelling and sensation of throat closing. I advised her that premedications are given, tylenol  650 mg and benadryl 25 mg, prior to administration. Together, these medications prevent many reactions. Plan to recheck her CBC, iron panel, and ferritin in approximately 6 weeks to determine need for additional treatment with IV iron.

## 2023-09-21 ENCOUNTER — Telehealth: Payer: Self-pay | Admitting: Gastroenterology

## 2023-09-21 NOTE — Telephone Encounter (Addendum)
 Procedure:Colonoscopy/Endoscopy Procedure date: 09/29/23 Procedure location: [facility] Arrival Time: 9:15 am Spoke with the patient Y/N: Yes Any prep concerns? No  Has the patient obtained the prep from the pharmacy ? Yes Do you have a care partner and transportation: Yes Any additional concerns? No

## 2023-09-22 ENCOUNTER — Encounter (HOSPITAL_COMMUNITY): Payer: Self-pay | Admitting: Internal Medicine

## 2023-09-22 NOTE — Progress Notes (Signed)
 Attempted to obtain medical history for pre op call via telephone, unable to reach at this time. HIPAA compliant voicemail message left requesting return call to pre surgical testing department.

## 2023-09-23 DIAGNOSIS — F413 Other mixed anxiety disorders: Secondary | ICD-10-CM | POA: Diagnosis not present

## 2023-09-29 ENCOUNTER — Ambulatory Visit (HOSPITAL_COMMUNITY): Admitting: Anesthesiology

## 2023-09-29 ENCOUNTER — Encounter (HOSPITAL_COMMUNITY)
Admission: RE | Disposition: A | Discharge status: Home / Self Care | Payer: Self-pay | Source: Home / Self Care | Attending: Internal Medicine

## 2023-09-29 ENCOUNTER — Encounter (HOSPITAL_COMMUNITY): Payer: Self-pay | Admitting: Internal Medicine

## 2023-09-29 ENCOUNTER — Other Ambulatory Visit: Payer: Self-pay

## 2023-09-29 ENCOUNTER — Ambulatory Visit (HOSPITAL_COMMUNITY)
Admission: RE | Admit: 2023-09-29 | Discharge: 2023-09-29 | Disposition: A | Discharge status: Home / Self Care | Attending: Internal Medicine | Admitting: Internal Medicine

## 2023-09-29 DIAGNOSIS — R110 Nausea: Secondary | ICD-10-CM | POA: Diagnosis not present

## 2023-09-29 DIAGNOSIS — K921 Melena: Secondary | ICD-10-CM | POA: Diagnosis not present

## 2023-09-29 DIAGNOSIS — K625 Hemorrhage of anus and rectum: Secondary | ICD-10-CM

## 2023-09-29 DIAGNOSIS — Z6841 Body Mass Index (BMI) 40.0 and over, adult: Secondary | ICD-10-CM | POA: Diagnosis not present

## 2023-09-29 DIAGNOSIS — K3189 Other diseases of stomach and duodenum: Secondary | ICD-10-CM | POA: Diagnosis not present

## 2023-09-29 DIAGNOSIS — R1084 Generalized abdominal pain: Secondary | ICD-10-CM | POA: Insufficient documentation

## 2023-09-29 DIAGNOSIS — K529 Noninfective gastroenteritis and colitis, unspecified: Secondary | ICD-10-CM | POA: Diagnosis not present

## 2023-09-29 DIAGNOSIS — K648 Other hemorrhoids: Secondary | ICD-10-CM | POA: Insufficient documentation

## 2023-09-29 DIAGNOSIS — E6689 Other obesity not elsewhere classified: Secondary | ICD-10-CM | POA: Diagnosis not present

## 2023-09-29 DIAGNOSIS — E282 Polycystic ovarian syndrome: Secondary | ICD-10-CM | POA: Insufficient documentation

## 2023-09-29 DIAGNOSIS — F418 Other specified anxiety disorders: Secondary | ICD-10-CM | POA: Diagnosis not present

## 2023-09-29 DIAGNOSIS — R634 Abnormal weight loss: Secondary | ICD-10-CM | POA: Diagnosis not present

## 2023-09-29 DIAGNOSIS — D509 Iron deficiency anemia, unspecified: Secondary | ICD-10-CM | POA: Insufficient documentation

## 2023-09-29 DIAGNOSIS — E559 Vitamin D deficiency, unspecified: Secondary | ICD-10-CM | POA: Diagnosis not present

## 2023-09-29 DIAGNOSIS — R197 Diarrhea, unspecified: Secondary | ICD-10-CM

## 2023-09-29 HISTORY — PX: ESOPHAGOGASTRODUODENOSCOPY: SHX5428

## 2023-09-29 HISTORY — PX: COLONOSCOPY: SHX5424

## 2023-09-29 LAB — PREGNANCY, URINE: Preg Test, Ur: NEGATIVE

## 2023-09-29 SURGERY — COLONOSCOPY
Anesthesia: Monitor Anesthesia Care

## 2023-09-29 MED ORDER — DEXMEDETOMIDINE HCL IN NACL 80 MCG/20ML IV SOLN
INTRAVENOUS | Status: DC | PRN
  Administered 2023-09-29 (×2): 10 ug via INTRAVENOUS

## 2023-09-29 MED ORDER — SODIUM CHLORIDE 0.9 % IV SOLN: INTRAVENOUS | Status: DC

## 2023-09-29 MED ORDER — PROPOFOL 500 MG/50ML IV EMUL
INTRAVENOUS | Status: DC | PRN
  Administered 2023-09-29: 150 ug/kg/min via INTRAVENOUS

## 2023-09-29 MED ORDER — SODIUM CHLORIDE 0.9 % IV SOLN
INTRAVENOUS | Status: DC | PRN
  Administered 2023-09-29: 500 mL via INTRAMUSCULAR

## 2023-09-29 MED ORDER — LIDOCAINE 2% (20 MG/ML) 5 ML SYRINGE
INTRAMUSCULAR | Status: DC | PRN
  Administered 2023-09-29: 100 mg via INTRAVENOUS

## 2023-09-29 MED ORDER — LACTATED RINGERS IV SOLN: INTRAVENOUS | Status: DC | PRN

## 2023-09-29 MED ORDER — PROPOFOL 10 MG/ML IV BOLUS
INTRAVENOUS | Status: DC | PRN
  Administered 2023-09-29 (×5): 50 mg via INTRAVENOUS

## 2023-09-29 MED ORDER — PROPOFOL 10 MG/ML IV BOLUS
INTRAVENOUS | Status: AC
Start: 2023-09-29 — End: 2023-09-29
  Filled 2023-09-29: qty 20

## 2023-09-29 NOTE — Transfer of Care (Signed)
 Immediate Anesthesia Transfer of Care Note  Patient: Amanda Wilson  Procedure(s) Performed: COLONOSCOPY EGD (ESOPHAGOGASTRODUODENOSCOPY)  Patient Location: PACU  Anesthesia Type:MAC  Level of Consciousness: awake, alert , oriented, and patient cooperative  Airway & Oxygen Therapy: Patient Spontanous Breathing  Post-op Assessment: Report given to RN and Post -op Vital signs reviewed and stable  Post vital signs: Reviewed and stable  Last Vitals:  Vitals Value Taken Time  BP 141/82 09/29/23 09:50  Temp    Pulse 94 09/29/23 09:50  Resp 22 09/29/23 09:50  SpO2 100 % on RA 09/29/23 09:50  Vitals shown include unfiled device data.  Last Pain:  Vitals:   09/29/23 0730  TempSrc: Temporal  PainSc: 0-No pain         Complications: No notable events documented.

## 2023-09-29 NOTE — H&P (Signed)
 [facility] History and Physical   Primary Care Physician:  Amanda Boby CROME, NP-C   Reason for Procedure:   Abdominal pain, diarrhea, rectal bleeding nausea  Plan:    EGD and colonoscopy     HPI: Amanda Wilson is a 33 y.o. female seen in clinic by Amanda Failing PA-C on 08/09/23  Here for evaluation of abdominal pain, diarrhea, rectal bleeding and has had B12 and iron-deficiency problems.  Lab Results  Component Value Date   FERRITIN 5.4 (L) 08/09/2023   Lab Results  Component Value Date   VITAMINB12 295 08/09/2023   Lab Results  Component Value Date   WBC 8.9 09/08/2023   HGB 12.6 09/08/2023   HCT 41.8 09/08/2023   MCV 82.6 09/08/2023   PLT 383 09/08/2023   Lab Results  Component Value Date   NA 138 09/08/2023   CL 101 09/08/2023   K 3.9 09/08/2023   CO2 24 09/08/2023   BUN 5 (L) 09/08/2023   CREATININE 0.55 09/08/2023   GFRNONAA >60 09/08/2023   CALCIUM 9.2 09/08/2023   ALBUMIN 3.1 (L) 09/08/2023   GLUCOSE 100 (H) 09/08/2023    Lab Results  Component Value Date   ALT 9 09/08/2023   AST 12 (L) 09/08/2023   ALKPHOS 68 09/08/2023   BILITOT 0.4 09/08/2023   Lab Results  Component Value Date   ESRSEDRATE 34 (H) 08/09/2023  CRP 2.4   Celiac testing negative   HPI 08/09/23 Amanda Wilson is a 33 y/o female with a past medical history as listed below including ADHD, and multiple others, who was referred to me by Amanda Boby CROME, NP-C for a complaint of weight loss and multiple GI problems.      12/18/2022 right upper quadrant ultrasound with extensive cholelithiasis.    01/11/2023 cholecystectomy.    02/09/2023 ferritin low at 5.7.  Recheck 06/10/2023 ferritin 10.0.    06/13/2023 GI pathogen panel showed norovirus.  CBC with MCV low at 75.1.  Hemoglobin normal.  CMP normal.  Hemoglobin A1c normal.  TSH and free T4 normal.  Vitamin D  low at 9.71.    Today, patient presents to clinic constellation of GI issues that have been going on for  years off-and-on.  Patient tells me that because she is overweight providers have overlooked her other symptoms but now it is too much to dismiss.  She tells me that she is in constant GI distress.  Chronic diarrhea sometimes up to 10-15 times a day or in a two day time period. Has had issues with anal fissures in the past with rectal bleeding and was told this was because she was overweight.  Sounds like she goes through periods of time where she is severely ill and unable to eat with even some nausea and vomiting and food aversion which relates to a 20 to 30 pound weight loss during those times.  Seems to kind of go away and then will come back.  She has tried various diets including lactose-free, gluten-free and vegetarian with no change in symptoms.  She just feels sick over the past year straight.  Initially all started with back cramps and found out this was her gallbladder which she had removed in December.  Tells me she wakes up feeling sick and will go to the bathroom and feel more sick.  Constant nausea with a food aversion, sometimes feels feverish.  Patient cannot remember her last solid stool, also has mucus at times and dry heaving of the rectum.  She has  also been found to be vitamin D , B12 and iron deficient and has been on supplementation over the year with no adequate response.  Also generalized abdominal cramping and pain most days.  Associated symptoms include joint pain and rosacea.    Does note that her periods were quite heavy and so she was put on birth control due to a diagnosis of PCOS, but she has not had a heavy period in 6 months and remains iron deficient.  Still on an iron supplement and vitamin D  supplement.    Denies fever, chills or family history of IBD.   Past Medical History:  Diagnosis Date   ADHD (attention deficit hyperactivity disorder)    Allergy     Anal fissure    Anemia    Anxiety    Complication of anesthesia    said nerve block didn't work on finger    COVID-19    early February   Depression    Family history of adverse reaction to anesthesia    Father- slow to awaken also- father had part of lung remved for an Empyema.   Frequent headaches    migraines- maybe 1 once a month since starting to take Topmax on a daily basis   Gallstones    Neuromuscular disorder (HCC)    nerve pain in right small finger after surgery   Obesity    Pneumonia    as a  child    Past Surgical History:  Procedure Laterality Date   CHOLECYSTECTOMY N/A 01/11/2023   Procedure: LAPAROSCOPIC CHOLECYSTECTOMY WITH INTRAOPERATIVE CHOLANGIOGRAM;  Surgeon: Amanda Cough, MD;  Location: [facility] OR;  Service: General;  Laterality: N/A;  RNFA Assist   HARDWARE REMOVAL Right 05/05/2020   Procedure: RIGHT HAND HARDWARE REMOVAL AND SMALL FINGER METACARPOPHALANGEAL JOINT RELEASE;  Surgeon: Amanda Lenis, MD;  Location: [facility] OR;  Service: Orthopedics;  Laterality: Right;   OPEN REDUCTION INTERNAL FIXATION (ORIF) PROXIMAL PHALANX Right 11/05/2019   Procedure: OPEN TREATMENT OF RIGHT RING FINGER FRACTURE AND 5TH METACARPAL FRACTURE;  Surgeon: Amanda Lenis, MD;  Location: [facility] OR;  Service: Orthopedics;  Laterality: Right;  LENGTH OF SURGERY: 90 MIN  PRE OP BLOCK     Current Facility-Administered Medications  Medication Dose Route Frequency Provider Last Rate Last Admin   0.9 %  sodium chloride  infusion   Intravenous Continuous Amanda Amanda Gibson, PA       0.9 %  sodium chloride  infusion    Continuous PRN Amanda Lupita BRAVO, MD 10 mL/hr at 09/29/23 0734 500 mL at 09/29/23 0734    Allergies as of 08/09/2023 - Review Complete 08/09/2023  Allergen Reaction Noted   Duricef [cefadroxil]  05/30/2016   Latex Rash 05/30/2016    Family History  Problem Relation Age of Onset   Allergic rhinitis Mother    Cancer Mother        skin   Depression Mother    Anxiety disorder Mother    Arthritis Mother    Allergic rhinitis Father    Heart disease Father    Stroke Father     Diabetes Father    Atrial fibrillation Father    Allergic rhinitis Sister    Depression Sister    Anxiety disorder Sister    Other Sister        some form of celiac   Urticaria Brother    Eczema Brother    Asthma Brother    Allergic rhinitis Brother    Anxiety disorder Brother    Dementia Maternal Grandfather  Atrial fibrillation Paternal Grandfather    Depression Maternal Aunt    Anxiety disorder Maternal Aunt    Depression Cousin    Angioedema Neg Hx     Social History   Socioeconomic History   Marital status: Single    Spouse name: Not on file   Number of children: 0   Years of education: Not on file   Highest education level: Master's degree (e.g., MA, MS, MEng, MEd, MSW, MBA)  Occupational History   Not on file  Tobacco Use   Smoking status: Never    Passive exposure: Never   Smokeless tobacco: Never  Vaping Use   Vaping status: Never Used  Substance and Sexual Activity   Alcohol use: Not Currently   Drug use: Never   Sexual activity: Not Currently    Partners: Male    Comment: Nuvaring  Other Topics Concern   Not on file  Social History Narrative   Not on file   Social Drivers of Health   Financial Resource Strain: Low Risk  (09/08/2023)   Overall Financial Resource Strain (CARDIA)    Difficulty of Paying Living Expenses: Not very hard  Food Insecurity: No Food Insecurity (09/20/2023)   Hunger Vital Sign    Worried About Running Out of Food in the Last Year: Never true    Ran Out of Food in the Last Year: Never true  Transportation Needs: No Transportation Needs (09/20/2023)   PRAPARE - Administrator, Civil Service (Medical): No    Lack of Transportation (Non-Medical): No  Physical Activity: Inactive (09/08/2023)   Exercise Vital Sign    Days of Exercise per Week: 0 days    Minutes of Exercise per Session: Not on file  Stress: Stress Concern Present (09/08/2023)   [employer] of Occupational Health - Occupational Stress  Questionnaire    Feeling of Stress: Rather much  Social Connections: Socially Isolated (09/08/2023)   Social Connection and Isolation Panel    Frequency of Communication with Friends and Family: More than three times a week    Frequency of Social Gatherings with Friends and Family: Once a week    Attends Religious Services: Never    Database administrator or Organizations: No    Attends Engineer, structural: Not on file    Marital Status: Never married  Intimate Partner Violence: Not At Risk (09/20/2023)   Humiliation, Afraid, Rape, and Kick questionnaire    Fear of Current or Ex-Partner: No    Emotionally Abused: No    Physically Abused: No    Sexually Abused: No    Review of Systems: Positive for facial cellulitis treated 08/2023 All other review of systems negative except as mentioned in the HPI.  Physical Exam: Vital signs BP (!) 180/81   Pulse (!) 109   Temp 98.4 F (36.9 C) (Temporal)   Ht 5' 4 (1.626 m)   Wt (!) 139.8 kg   SpO2 98%   BMI 52.90 kg/m   General:   Alert,  Well-developed, well-nourished, pleasant and cooperative in NAD Lungs:  Clear throughout to auscultation.   Heart:  Regular rate and rhythm; no murmurs, clicks, rubs,  or gallops. Abdomen:  Soft, nontender and nondistended. Normal bowel sounds. obese  Neuro/Psych:  Alert and cooperative. Normal mood and affect. A and O x 3   @Rjay Revolorio  CHARLENA Commander, MD, [facility] [phone] (pager) 09/29/2023 8:33 AM@

## 2023-09-29 NOTE — Anesthesia Postprocedure Evaluation (Signed)
 Anesthesia Post Note  Patient: Amanda Wilson  Procedure(s) Performed: COLONOSCOPY EGD (ESOPHAGOGASTRODUODENOSCOPY)     Patient location during evaluation: Endoscopy Anesthesia Type: MAC Level of consciousness: oriented, awake and alert and awake Pain management: pain level controlled Vital Signs Assessment: post-procedure vital signs reviewed and stable Respiratory status: spontaneous breathing, nonlabored ventilation, respiratory function stable and patient connected to nasal cannula oxygen Cardiovascular status: blood pressure returned to baseline and stable Postop Assessment: no headache, no backache and no apparent nausea or vomiting Anesthetic complications: no   No notable events documented.  Last Vitals:  Vitals:   09/29/23 1010 09/29/23 1025  BP: (!) 162/104 (!) 183/95  Pulse: (!) 104 96  Resp: 11 (!) 22  Temp:    SpO2: 100% 100%    Last Pain:  Vitals:   09/29/23 1025  TempSrc:   PainSc: 0-No pain                 Garnette FORBES Skillern

## 2023-09-29 NOTE — Anesthesia Preprocedure Evaluation (Signed)
 Anesthesia Evaluation  Patient identified by MRN, date of birth, ID band Patient awake    Reviewed: Allergy  & Precautions, NPO status , Patient's Chart, lab work & pertinent test results  Airway Mallampati: II  TM Distance: >3 FB Neck ROM: Full    Dental  (+) Teeth Intact, Dental Advisory Given   Pulmonary neg pulmonary ROS   Pulmonary exam normal breath sounds clear to auscultation       Cardiovascular negative cardio ROS Normal cardiovascular exam Rhythm:Regular Rate:Normal     Neuro/Psych  Headaches    GI/Hepatic Neg liver ROS,,,generalized abdominal pain, weight loss, iron deficiency anemia, diarrhea, hematochezia   Endo/Other    Class 4 obesity  Renal/GU negative Renal ROS     Musculoskeletal negative musculoskeletal ROS (+)    Abdominal   Peds  (+) ADHD Hematology negative hematology ROS (+)   Anesthesia Other Findings Day of surgery medications reviewed with the patient.  Reproductive/Obstetrics                              Anesthesia Physical Anesthesia Plan  ASA: 4  Anesthesia Plan: MAC   Post-op Pain Management:    Induction: Intravenous  PONV Risk Score and Plan: 2 and TIVA and Treatment may vary due to age or medical condition  Airway Management Planned: Natural Airway and Simple Face Mask  Additional Equipment:   Intra-op Plan:   Post-operative Plan:   Informed Consent: I have reviewed the patients History and Physical, chart, labs and discussed the procedure including the risks, benefits and alternatives for the proposed anesthesia with the patient or authorized representative who has indicated his/her understanding and acceptance.     Dental advisory given  Plan Discussed with: CRNA and Anesthesiologist  Anesthesia Plan Comments:         Anesthesia Quick Evaluation

## 2023-09-29 NOTE — Op Note (Signed)
 [facility] Patient Name: Amanda Wilson Procedure Date: 09/29/2023 MRN: [id number] Attending MD: Lupita FORBES Commander , MD, [id number] Date of Birth: 1990-10-26 CSN: [id number] Age: 33 Admit Type: Outpatient Procedure:                Upper GI endoscopy Indications:              Generalized abdominal pain, Iron deficiency anemia,                            Diarrhea, Nausea Providers:                Lupita CHARLENA Commander, MD, Ozell Pouch, Corky Czech, Technician Referring MD:              Medicines:                Monitored Anesthesia Care Complications:            No immediate complications. Estimated Blood Loss:     Estimated blood loss was minimal. Procedure:                Pre-Anesthesia Assessment:                           - Prior to the procedure, a History and Physical                            was performed, and patient medications and                            allergies were reviewed. The patient's tolerance of                            previous anesthesia was also reviewed. The risks                            and benefits of the procedure and the sedation                            options and risks were discussed with the patient.                            All questions were answered, and informed consent                            was obtained. Prior Anticoagulants: The patient has                            taken no anticoagulant or antiplatelet agents. ASA                            Grade Assessment: III - A patient with severe  systemic disease. After reviewing the risks and                            benefits, the patient was deemed in satisfactory                            condition to undergo the procedure.                           After obtaining informed consent, the endoscope was                            passed under direct vision. Throughout the                            procedure, the  patient's blood pressure, pulse, and                            oxygen saturations were monitored continuously. The                            [id number]-[id number] ([id number]) Olympus endoscope was introduced                            through the mouth, and advanced to the second part                            of duodenum. The upper GI endoscopy was                            accomplished without difficulty. The patient                            tolerated the procedure well. Scope In: Scope Out: Findings:      Striped moderately erythematous mucosa without bleeding was found in the       gastric antrum. Biopsies were taken with a cold forceps for histology.       Verification of patient identification for the specimen was done.       Estimated blood loss was minimal.      The exam was otherwise without abnormality.      The cardia and gastric fundus were normal on retroflexion.      The exam was otherwise without abnormality. Impression:               - Erythematous mucosa in the antrum. Biopsied.                           - The examination was otherwise normal.                           - The examination was otherwise normal. Moderate Sedation:      Not Applicable - Patient had care per Anesthesia. Recommendation:           - Patient has a contact number available for  emergencies. The signs and symptoms of potential                            delayed complications were discussed with the                            patient. Return to normal activities tomorrow.                            Written discharge instructions were provided to the                            patient.                           - Resume previous diet.                           - Continue present medications.                           - Await pathology results.                           - See the other procedure note for documentation of                            additional recommendations.  Colonoscopy next Procedure Code(s):        --- Professional ---                           [phone], Esophagogastroduodenoscopy, flexible,                            transoral; with biopsy, single or multiple Diagnosis Code(s):        --- Professional ---                           K31.89, Other diseases of stomach and duodenum                           R10.84, Generalized abdominal pain                           D50.9, Iron deficiency anemia, unspecified                           R19.7, Diarrhea, unspecified                           R11.0, Nausea CPT copyright 2022 American Medical Association. All rights reserved. The codes documented in this report are preliminary and upon coder review may  be revised to meet current compliance requirements. Lupita FORBES Commander, MD 09/29/2023 9:54:17 AM This report has been signed electronically. Number of Addenda: 0

## 2023-09-29 NOTE — Op Note (Signed)
 [facility] Patient Name: Amanda Wilson Procedure Date: 09/29/2023 MRN: [id number] Attending MD: Lupita FORBES Commander , MD, [id number] Date of Birth: 07/25/1990 CSN: [id number] Age: 33 Admit Type: Outpatient Procedure:                Colonoscopy Indications:              Generalized abdominal pain, Chronic diarrhea,                            Rectal bleeding, Iron deficiency anemia Providers:                Lupita CHARLENA Commander, MD, Ozell Pouch, Corky Czech, Technician Referring MD:              Medicines:                Monitored Anesthesia Care Complications:            No immediate complications. Estimated Blood Loss:     Estimated blood loss was minimal. Procedure:                Pre-Anesthesia Assessment:                           - Prior to the procedure, a History and Physical                            was performed, and patient medications and                            allergies were reviewed. The patient's tolerance of                            previous anesthesia was also reviewed. The risks                            and benefits of the procedure and the sedation                            options and risks were discussed with the patient.                            All questions were answered, and informed consent                            was obtained. Prior Anticoagulants: The patient has                            taken no anticoagulant or antiplatelet agents. ASA                            Grade Assessment: III - A patient with severe  systemic disease. After reviewing the risks and                            benefits, the patient was deemed in satisfactory                            condition to undergo the procedure.                           After obtaining informed consent, the colonoscope                            was passed under direct vision. Throughout the                             procedure, the patient's blood pressure, pulse, and                            oxygen saturations were monitored continuously. The                            [id number]-[id number] ([id number]) Olympus colonoscope was                            introduced through the anus and advanced to the the                            terminal ileum, with identification of the                            appendiceal orifice and IC valve. The colonoscopy                            was performed without difficulty. The patient                            tolerated the procedure well. The quality of the                            bowel preparation was good. The terminal ileum,                            ileocecal valve, appendiceal orifice, and rectum                            were photographed. Scope In: 9:31:32 AM Scope Out: 9:43:04 AM Scope Withdrawal Time: 0 hours 8 minutes 53 seconds  Total Procedure Duration: 0 hours 11 minutes 32 seconds  Findings:      The perianal and digital rectal examinations were normal.      The terminal ileum appeared normal.      The colon (entire examined portion) appeared normal. Biopsies for       histology were taken with a cold forceps from the entire colon for       evaluation of microscopic colitis.  Verification of patient       identification for the specimen was done. Estimated blood loss was       minimal.      Internal hemorrhoids were found. The hemorrhoids were small.      The exam was otherwise without abnormality on direct and retroflexion       views. Impression:               - The examined portion of the ileum was normal.                           - The entire examined colon is normal. Biopsied.                           - Internal hemorrhoids.                           - The examination was otherwise normal on direct                            and retroflexion views. Moderate Sedation:      Not Applicable - Patient had care per Anesthesia. Recommendation:            - Patient has a contact number available for                            emergencies. The signs and symptoms of potential                            delayed complications were discussed with the                            patient. Return to normal activities tomorrow.                            Written discharge instructions were provided to the                            patient.                           - Resume previous diet.                           - Continue present medications.                           - Await pathology results.                           - No recommendation at this time regarding repeat                            colonoscopy due to young age.                           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                            (diarrhea predated cholecystectomy and is not worse) Procedure Code(s):        --- Professional ---                           [phone], Colonoscopy, flexible; with biopsy, single                            or multiple Diagnosis Code(s):        --- Professional ---                           K64.8, Other hemorrhoids                           R10.84, Generalized abdominal pain                           K52.9, Noninfective gastroenteritis and colitis,                            unspecified                           K62.5, Hemorrhage of anus and rectum                           D50.9, Iron deficiency anemia, unspecified CPT copyright 2022 American Medical Association. All rights reserved. The codes documented in this report are preliminary and upon coder review may  be revised to meet current compliance  requirements. Lupita FORBES Commander, MD 09/29/2023 10:02:23 AM This report has been signed electronically. Number of Addenda: 0

## 2023-09-29 NOTE — Discharge Instructions (Signed)
 The upper endoscopy revealed some erythema or red color change in the stomach. Often not a problem but biopsied to evaluate.  Colonoscopy normal except for small hemorrhoids. I took biopsies of the colon lining looking for microscopic colitis.  I will advise further after pathology review.  I appreciate the opportunity to care for you. Lupita CHARLENA Commander, MD, FACG  YOU HAD AN ENDOSCOPIC PROCEDURE TODAY: Refer to the procedure report and other information in the discharge instructions given to you for any specific questions about what was found during the examination. If this information does not answer your questions, please call Dr. Darilyn office at [phone] to clarify.   YOU SHOULD EXPECT: Some feelings of bloating in the abdomen. Passage of more gas than usual. Walking can help get rid of the air that was put into your GI tract during the procedure and reduce the bloating. If you had a lower endoscopy (such as a colonoscopy or flexible sigmoidoscopy) you may notice spotting of blood in your stool or on the toilet paper. Some abdominal soreness may be present for a day or two, also.  DIET: Your first meal following the procedure should be a light meal and then it is ok to progress to your normal diet. A half-sandwich or bowl of soup is an example of a good first meal. Heavy or fried foods are harder to digest and may make you feel nauseous or bloated. Drink plenty of fluids but you should avoid alcoholic beverages for 24 hours.   ACTIVITY: Your care partner should take you home directly after the procedure. You should plan to take it easy, moving slowly for the rest of the day. You can resume normal activity the day after the procedure however YOU SHOULD NOT DRIVE, use power tools, machinery or perform tasks that involve climbing or major physical exertion for 24 hours (because of the sedation medicines used during the test).   SYMPTOMS TO REPORT IMMEDIATELY: A gastroenterologist can be  reached at any hour. Please call [phone]  for any of the following symptoms:  Following lower endoscopy (colonoscopy, flexible sigmoidoscopy) Excessive amounts of blood in the stool  Significant tenderness, worsening of abdominal pains  Swelling of the abdomen that is new, acute  Fever of 100 or higher  Following upper endoscopy (EGD, EUS, ERCP, esophageal dilation) Vomiting of blood or coffee ground material  New, significant abdominal pain  New, significant chest pain or pain under the shoulder blades  Painful or persistently difficult swallowing  New shortness of breath  Black, tarry-looking or red, bloody stools  FOLLOW UP:  If any biopsies were taken you will be contacted by phone or by letter within the next 1-3 weeks. Call [phone]  if you have not heard about the biopsies in 3 weeks.  Please also call with any specific questions about appointments or follow up tests.

## 2023-09-30 ENCOUNTER — Ambulatory Visit (INDEPENDENT_AMBULATORY_CARE_PROVIDER_SITE_OTHER)

## 2023-09-30 VITALS — BP 116/80 | HR 76 | Temp 98.4°F | Resp 18 | Ht 64.0 in | Wt 305.0 lb

## 2023-09-30 DIAGNOSIS — D649 Anemia, unspecified: Secondary | ICD-10-CM

## 2023-09-30 DIAGNOSIS — D500 Iron deficiency anemia secondary to blood loss (chronic): Secondary | ICD-10-CM

## 2023-09-30 LAB — SURGICAL PATHOLOGY

## 2023-09-30 MED ORDER — DIPHENHYDRAMINE HCL 25 MG PO CAPS
25.0000 mg | ORAL_CAPSULE | Freq: Once | ORAL | Status: AC
  Administered 2023-09-30: 25 mg via ORAL
  Filled 2023-09-30: qty 1

## 2023-09-30 MED ORDER — ACETAMINOPHEN 325 MG PO TABS
650.0000 mg | ORAL_TABLET | Freq: Once | ORAL | Status: AC
  Administered 2023-09-30: 650 mg via ORAL
  Filled 2023-09-30: qty 2

## 2023-09-30 MED ORDER — SODIUM CHLORIDE 0.9 % IV SOLN
510.0000 mg | Freq: Once | INTRAVENOUS | Status: AC
  Administered 2023-09-30: 510 mg via INTRAVENOUS
  Filled 2023-09-30: qty 17

## 2023-09-30 NOTE — Patient Instructions (Signed)

## 2023-09-30 NOTE — Progress Notes (Signed)
 Diagnosis: Iron Deficiency Anemia  Provider:  Praveen Mannam MD  Procedure: IV Infusion  IV Type: Peripheral, IV Location: L Hand  Feraheme (Ferumoxytol ), Dose: 510 mg  Infusion Start Time: 1359  Infusion Stop Time: 1420  Post Infusion IV Care: Observation period completed and Peripheral IV Discontinued  Discharge: Condition: Good, Destination: Home . AVS Provided  Performed by:  Shaylyn Bawa, RN

## 2023-10-02 ENCOUNTER — Encounter (HOSPITAL_COMMUNITY): Payer: Self-pay | Admitting: Internal Medicine

## 2023-10-03 ENCOUNTER — Ambulatory Visit: Payer: Self-pay | Admitting: Internal Medicine

## 2023-10-03 MED ORDER — COLESTIPOL HCL 1 G PO TABS: 2.0000 g | ORAL_TABLET | Freq: Every day | ORAL | 3 refills | Status: DC

## 2023-10-08 DIAGNOSIS — F413 Other mixed anxiety disorders: Secondary | ICD-10-CM | POA: Diagnosis not present

## 2023-10-11 DIAGNOSIS — F413 Other mixed anxiety disorders: Secondary | ICD-10-CM | POA: Diagnosis not present

## 2023-10-14 ENCOUNTER — Ambulatory Visit (INDEPENDENT_AMBULATORY_CARE_PROVIDER_SITE_OTHER)

## 2023-10-14 VITALS — BP 116/79 | HR 73 | Temp 98.3°F | Resp 20 | Ht 64.0 in | Wt 305.2 lb

## 2023-10-14 DIAGNOSIS — D649 Anemia, unspecified: Secondary | ICD-10-CM | POA: Diagnosis not present

## 2023-10-14 DIAGNOSIS — D500 Iron deficiency anemia secondary to blood loss (chronic): Secondary | ICD-10-CM

## 2023-10-14 MED ORDER — SODIUM CHLORIDE 0.9 % IV SOLN
510.0000 mg | Freq: Once | INTRAVENOUS | Status: AC
  Administered 2023-10-14: 510 mg via INTRAVENOUS
  Filled 2023-10-14: qty 17

## 2023-10-14 MED ORDER — ACETAMINOPHEN 325 MG PO TABS
650.0000 mg | ORAL_TABLET | Freq: Once | ORAL | Status: AC
  Administered 2023-10-14: 650 mg via ORAL
  Filled 2023-10-14: qty 2

## 2023-10-14 MED ORDER — DIPHENHYDRAMINE HCL 25 MG PO CAPS
25.0000 mg | ORAL_CAPSULE | Freq: Once | ORAL | Status: AC
  Administered 2023-10-14: 25 mg via ORAL
  Filled 2023-10-14: qty 1

## 2023-10-14 NOTE — Progress Notes (Signed)
 Diagnosis: Acute Anemia  Provider:  Mannam, Praveen MD  Procedure: IV Infusion  IV Type: Peripheral, IV Location: R Hand  Feraheme (Ferumoxytol ), Dose: 510 mg  Infusion Start Time: 1034  Infusion Stop Time: 1052  Post Infusion IV Care: Observation period completed and Peripheral IV Discontinued  Discharge: Condition: Good, Destination: Home . AVS Declined  Performed by:  Rocky FORBES Sar, RN

## 2023-10-21 DIAGNOSIS — F419 Anxiety disorder, unspecified: Secondary | ICD-10-CM | POA: Diagnosis not present

## 2023-10-21 DIAGNOSIS — F330 Major depressive disorder, recurrent, mild: Secondary | ICD-10-CM | POA: Diagnosis not present

## 2023-10-21 DIAGNOSIS — F909 Attention-deficit hyperactivity disorder, unspecified type: Secondary | ICD-10-CM | POA: Diagnosis not present

## 2023-11-01 ENCOUNTER — Other Ambulatory Visit: Payer: Self-pay

## 2023-11-01 DIAGNOSIS — D500 Iron deficiency anemia secondary to blood loss (chronic): Secondary | ICD-10-CM

## 2023-11-01 DIAGNOSIS — D649 Anemia, unspecified: Secondary | ICD-10-CM

## 2023-11-02 ENCOUNTER — Encounter: Payer: Self-pay | Admitting: Physician Assistant

## 2023-11-02 ENCOUNTER — Inpatient Hospital Stay: Attending: Nurse Practitioner

## 2023-11-02 ENCOUNTER — Ambulatory Visit: Admitting: Physician Assistant

## 2023-11-02 VITALS — BP 134/80 | HR 95 | Ht 64.0 in | Wt 302.4 lb

## 2023-11-02 DIAGNOSIS — D500 Iron deficiency anemia secondary to blood loss (chronic): Secondary | ICD-10-CM | POA: Insufficient documentation

## 2023-11-02 DIAGNOSIS — D649 Anemia, unspecified: Secondary | ICD-10-CM | POA: Insufficient documentation

## 2023-11-02 DIAGNOSIS — D509 Iron deficiency anemia, unspecified: Secondary | ICD-10-CM | POA: Diagnosis not present

## 2023-11-02 DIAGNOSIS — N920 Excessive and frequent menstruation with regular cycle: Secondary | ICD-10-CM | POA: Diagnosis present

## 2023-11-02 DIAGNOSIS — R109 Unspecified abdominal pain: Secondary | ICD-10-CM | POA: Diagnosis not present

## 2023-11-02 DIAGNOSIS — K625 Hemorrhage of anus and rectum: Secondary | ICD-10-CM | POA: Diagnosis not present

## 2023-11-02 DIAGNOSIS — R1084 Generalized abdominal pain: Secondary | ICD-10-CM

## 2023-11-02 DIAGNOSIS — R197 Diarrhea, unspecified: Secondary | ICD-10-CM | POA: Diagnosis not present

## 2023-11-02 LAB — CBC WITH DIFFERENTIAL (CANCER CENTER ONLY)
Abs Immature Granulocytes: 0.01 K/uL (ref 0.00–0.07)
Basophils Absolute: 0.1 K/uL (ref 0.0–0.1)
Basophils Relative: 1 %
Eosinophils Absolute: 0.1 K/uL (ref 0.0–0.5)
Eosinophils Relative: 1 %
HCT: 41.8 % (ref 36.0–46.0)
Hemoglobin: 13.8 g/dL (ref 12.0–15.0)
Immature Granulocytes: 0 %
Lymphocytes Relative: 21 %
Lymphs Abs: 1.8 K/uL (ref 0.7–4.0)
MCH: 27 pg (ref 26.0–34.0)
MCHC: 33 g/dL (ref 30.0–36.0)
MCV: 81.6 fL (ref 80.0–100.0)
Monocytes Absolute: 0.5 K/uL (ref 0.1–1.0)
Monocytes Relative: 6 %
Neutro Abs: 5.8 K/uL (ref 1.7–7.7)
Neutrophils Relative %: 71 %
Platelet Count: 294 K/uL (ref 150–400)
RBC: 5.12 MIL/uL — ABNORMAL HIGH (ref 3.87–5.11)
RDW: 15.9 % — ABNORMAL HIGH (ref 11.5–15.5)
WBC Count: 8.2 K/uL (ref 4.0–10.5)
nRBC: 0 % (ref 0.0–0.2)

## 2023-11-02 LAB — IRON AND IRON BINDING CAPACITY (CC-WL,HP ONLY)
Iron: 47 ug/dL (ref 28–170)
Saturation Ratios: 12 % (ref 10.4–31.8)
TIBC: 400 ug/dL (ref 250–450)
UIBC: 353 ug/dL (ref 148–442)

## 2023-11-02 LAB — FERRITIN: Ferritin: 279 ng/mL (ref 11–307)

## 2023-11-02 LAB — VITAMIN B12: Vitamin B-12: 472 pg/mL (ref 180–914)

## 2023-11-02 MED ORDER — HYDROCORTISONE ACETATE 25 MG RE SUPP: 25.0000 mg | Freq: Two times a day (BID) | RECTAL | 1 refills | Status: DC

## 2023-11-02 MED ORDER — DICYCLOMINE HCL 20 MG PO TABS: 20.0000 mg | ORAL_TABLET | Freq: Three times a day (TID) | ORAL | 5 refills | Status: AC

## 2023-11-02 NOTE — Patient Instructions (Addendum)
 Your provider has requested that you go to the basement level for lab work before leaving today. Press B on the elevator. The lab is located at the first door on the left as you exit the elevator.  We have sent the following medications to your pharmacy for you to pick up at your convenience: Colestipol  twice daily.  Dicyclomine  20 mg 20-30 mintues before meals.  Hydrocortisone suppositories twice daily for 7 days.   Cara care APP for IBS.   _______________________________________________________  If your blood pressure at your visit was 140/90 or greater, please contact your primary care physician to follow up on this.  _______________________________________________________  If you are age 31 or older, your body mass index should be between 23-30. Your Body mass index is 51.9 kg/m. If this is out of the aforementioned range listed, please consider follow up with your Primary Care Provider.  If you are age 31 or younger, your body mass index should be between 19-25. Your Body mass index is 51.9 kg/m. If this is out of the aformentioned range listed, please consider follow up with your Primary Care Provider.   ________________________________________________________  The [facility] providers would like to encourage you to use MYCHART to communicate with providers for non-urgent requests or questions.  Due to long hold times on the telephone, sending your provider a message by [facility] may be a faster and more efficient way to get a response.  Please allow 48 business hours for a response.  Please remember that this is for non-urgent requests.  _______________________________________________________  [facility] is using a team-based approach to care.  Your team is made up of your doctor and two to three APPS. Our APPS (Nurse Practitioners and Physician Assistants) work with your physician to ensure care continuity for you. They are fully qualified to address your health  concerns and develop a treatment plan. They communicate directly with your gastroenterologist to care for you. Seeing the Advanced Practice Practitioners on your physician's team can help you by facilitating care more promptly, often allowing for earlier appointments, access to diagnostic testing, procedures, and other specialty referrals.

## 2023-11-02 NOTE — Progress Notes (Addendum)
 Chief Complaint: Follow-up abdominal pain  HPI:    Mrs. Villasenor is a 33 year old female with a past medical history as listed below including ADHD and multiple others, who returns to clinic today for follow-up of abdominal pain and other GI problems.    12/18/2022 right upper quadrant ultrasound with extensive cholelithiasis.    01/11/2023 cholecystectomy.    02/09/2023 ferritin low at 5.7.  Recheck 06/10/2023 ferritin 10.0.    06/13/2023 GI pathogen panel showed norovirus.  CBC with MCV low at 75.1.  Hemoglobin normal.  CMP normal.  Hemoglobin A1c normal.  TSH and free T4 normal.  Vitamin D  low at 9.71.    08/09/2023 patient seen in clinic with a constellation of GI issues that been going on for years off-and-on.  At that time discussed generalized abdominal pain, hematochezia, history of anal fissure, nausea, status post cholecystectomy, weight loss, vitamin D /B12/iron deficiency and diarrhea.  At that time scheduled for an EGD and colonoscopy at the hospital given a BMI greater than 50.  Also proceed with the labs.  She was referred to hematology for Feraheme infusions given persistently low ferritin.  Given Hyoscyamine.    09/08/2023 CTAP with contrast done for abdominal pain with no definite abnormality.    09/29/23 colonoscopy with examined portion of the ileum normal, internal hemorrhoids and otherwise normal.  At that time discussed that diarrhea was likely IBS with anorectal bleeding.  Pathology showed no specific pathologic diagnosis.  She was prescribed colestipol  to help with diarrhea recommended taking midday and at least 4 hours from other meds especially her OCP.    09/29/23 EGD with erythematous mucosa in the antrum and otherwise normal.  Pathology showed gastric mucosa with no specific pathologic diagnosis.    Today, patient presents to clinic and is understandably quite frustrated with ongoing symptoms.  She tells me for at least the past year she has been having diarrhea, sometimes 8-10  times in a day and often urgently after she eats accompanied by abdominal pain which sometimes presents itself as pinpricks in the right and left side of her abdomen.  She has days where she has bloody mucus with her stools and other days where it is a darker color and other days where it is more of a yellow/orange.  Due to the symptoms it inhibits her lifestyle.  She really is afraid to go out due to the urgency and it has even hindered her work.  Describes that after her colonoscopy she was told that she had IBS and needed to watch her diet and that her menstrual cycle (Which she no longer has due to being on constant oral contraception) was causing her anemia.  She tells me she has tried the FODMAP diet before and never could figure out any food triggers for her symptoms.  She is tearful today and frustrated with always being sick.  Her symptoms inhibit her sleep at night.  They may be slightly worse and more frequent now than they were previously.    Patient has also followed with endocrinology in the past and they could never find out that anything was wrong.  Mentions a lesion on her pituitary gland.  Has been told in the past she has IBS but had never had procedures before.    She is currently on Fluoxetine  40 mg in the morning and Adderall.    Denies fever or chills.  Past Medical History:  Diagnosis Date   ADHD (attention deficit hyperactivity disorder)    Allergy   Anal fissure    Anemia    Anxiety    Complication of anesthesia    said nerve block didn't work on finger   COVID-19    early February   Depression    Family history of adverse reaction to anesthesia    Father- slow to awaken also- father had part of lung remved for an Empyema.   Frequent headaches    migraines- maybe 1 once a month since starting to take Topmax on a daily basis   Gallstones    Neuromuscular disorder (HCC)    nerve pain in right small finger after surgery   Obesity    Pneumonia    as a  child     Past Surgical History:  Procedure Laterality Date   CHOLECYSTECTOMY N/A 01/11/2023   Procedure: LAPAROSCOPIC CHOLECYSTECTOMY WITH INTRAOPERATIVE CHOLANGIOGRAM;  Surgeon: Belinda Cough, MD;  Location: [facility] OR;  Service: General;  Laterality: N/A;  RNFA Assist   COLONOSCOPY N/A 09/29/2023   Procedure: COLONOSCOPY;  Surgeon: Avram Lupita BRAVO, MD;  Location: [facility];  Service: Gastroenterology;  Laterality: N/A;   ESOPHAGOGASTRODUODENOSCOPY N/A 09/29/2023   Procedure: EGD (ESOPHAGOGASTRODUODENOSCOPY);  Surgeon: Avram Lupita BRAVO, MD;  Location: [facility];  Service: Gastroenterology;  Laterality: N/A;   HARDWARE REMOVAL Right 05/05/2020   Procedure: RIGHT HAND HARDWARE REMOVAL AND SMALL FINGER METACARPOPHALANGEAL JOINT RELEASE;  Surgeon: Sebastian Lenis, MD;  Location: [facility] OR;  Service: Orthopedics;  Laterality: Right;   OPEN REDUCTION INTERNAL FIXATION (ORIF) PROXIMAL PHALANX Right 11/05/2019   Procedure: OPEN TREATMENT OF RIGHT RING FINGER FRACTURE AND 5TH METACARPAL FRACTURE;  Surgeon: Sebastian Lenis, MD;  Location: [facility] OR;  Service: Orthopedics;  Laterality: Right;  LENGTH OF SURGERY: 90 MIN  PRE OP BLOCK    Current Outpatient Medications  Medication Sig Dispense Refill   amphetamine -dextroamphetamine  (ADDERALL XR) 30 MG 24 hr capsule Take 30 mg by mouth every morning.     cloNIDine (CATAPRES) 0.1 MG tablet Take 0.1 mg by mouth at bedtime.     colestipol  (COLESTID ) 1 g tablet Take 2 tablets (2 g total) by mouth daily. Take with lunch (mid-day so as not to bind other medications) 60 tablet 3   Dermatological Products, Misc. (DERMAREST ROSACEA EX) Apply 1 Application topically in the morning and at bedtime. Triple rosacea cream (azelaic acid/ivermectin/metronidazole)     dicyclomine  (BENTYL ) 20 MG tablet Take 1 tablet (20 mg total) by mouth 4 (four) times daily -  before meals and at bedtime. 20 tablet 0   drospirenone-ethinyl estradiol  (YAZ) 3-0.02 MG tablet Take 1 tablet by mouth daily.      Ferrous Sulfate (IRON PO) Take by mouth.     fluconazole  (DIFLUCAN ) 150 MG tablet Take 1 tablet (150 mg total) by mouth every 3 (three) days as needed. 3 tablet 0   FLUoxetine  (PROZAC ) 20 MG capsule Take 20 mg by mouth every morning.     MELATONIN PO Take 5 mg by mouth at bedtime.     montelukast  (SINGULAIR ) 10 MG tablet Take 1 tablet (10 mg total) by mouth at bedtime. 30 tablet 5   NYSTATIN powder Apply topically as needed.     Olopatadine-Mometasone (RYALTRIS ) 665-25 MCG/ACT SUSP Place 2 sprays into the nose 2 (two) times daily as needed. 29 g 5   ondansetron  (ZOFRAN -ODT) 4 MG disintegrating tablet Take 1 tablet (4 mg total) by mouth every 6 (six) hours as needed for nausea or vomiting. 20 tablet 0   polyethylene glycol (MIRALAX / GLYCOLAX) 17 g packet Take  17 g by mouth in the morning. (Patient taking differently: Take 17 g by mouth daily as needed for mild constipation.)     triamcinolone  (KENALOG ) 0.025 % ointment Apply 1 Application topically 2 (two) times daily. 30 g 3   Vitamin D , Ergocalciferol , (DRISDOL ) 1.25 MG (50000 UNIT) CAPS capsule Take 1 capsule (50,000 Units total) by mouth every 7 (seven) days. 12 capsule 0   No current facility-administered medications for this visit.    Allergies as of 11/02/2023 - Review Complete 09/29/2023  Allergen Reaction Noted   Duricef [cefadroxil]  05/30/2016   Latex Rash 05/30/2016    Family History  Problem Relation Age of Onset   Allergic rhinitis Mother    Cancer Mother        skin   Depression Mother    Anxiety disorder Mother    Arthritis Mother    Allergic rhinitis Father    Heart disease Father    Stroke Father    Diabetes Father    Atrial fibrillation Father    Allergic rhinitis Sister    Depression Sister    Anxiety disorder Sister    Other Sister        some form of celiac   Urticaria Brother    Eczema Brother    Asthma Brother    Allergic rhinitis Brother    Anxiety disorder Brother    Dementia Maternal  Grandfather    Atrial fibrillation Paternal Grandfather    Depression Maternal Aunt    Anxiety disorder Maternal Aunt    Depression Cousin    Angioedema Neg Hx     Social History   Socioeconomic History   Marital status: Single    Spouse name: Not on file   Number of children: 0   Years of education: Not on file   Highest education level: Master's degree (e.g., MA, MS, MEng, MEd, MSW, MBA)  Occupational History   Not on file  Tobacco Use   Smoking status: Never    Passive exposure: Never   Smokeless tobacco: Never  Vaping Use   Vaping status: Never Used  Substance and Sexual Activity   Alcohol use: Not Currently   Drug use: Never   Sexual activity: Not Currently    Partners: Male    Comment: Nuvaring  Other Topics Concern   Not on file  Social History Narrative   Not on file   Social Drivers of Health   Financial Resource Strain: Low Risk  (09/08/2023)   Overall Financial Resource Strain (CARDIA)    Difficulty of Paying Living Expenses: Not very hard  Food Insecurity: No Food Insecurity (09/20/2023)   Hunger Vital Sign    Worried About Running Out of Food in the Last Year: Never true    Ran Out of Food in the Last Year: Never true  Transportation Needs: No Transportation Needs (09/20/2023)   PRAPARE - Administrator, Civil Service (Medical): No    Lack of Transportation (Non-Medical): No  Physical Activity: Inactive (09/08/2023)   Exercise Vital Sign    Days of Exercise per Week: 0 days    Minutes of Exercise per Session: Not on file  Stress: Stress Concern Present (09/08/2023)   [employer] of Occupational Health - Occupational Stress Questionnaire    Feeling of Stress: Rather much  Social Connections: Socially Isolated (09/08/2023)   Social Connection and Isolation Panel    Frequency of Communication with Friends and Family: More than three times a week    Frequency of  Social Gatherings with Friends and Family: Once a week    Attends Religious  Services: Never    Database administrator or Organizations: No    Attends Engineer, structural: Not on file    Marital Status: Never married  Intimate Partner Violence: Not At Risk (09/20/2023)   Humiliation, Afraid, Rape, and Kick questionnaire    Fear of Current or Ex-Partner: No    Emotionally Abused: No    Physically Abused: No    Sexually Abused: No    Review of Systems:    Constitutional: No weight loss, fever or chills Cardiovascular: No chest pain Respiratory: No SOB  Gastrointestinal: See HPI and otherwise negative   Physical Exam:  Vital signs: BP 134/80 (BP Location: Left Arm, Patient Position: Sitting, Cuff Size: Large)   Pulse 95   Ht 5' 4 (1.626 m) Comment: measured without shoes.  Wt (!) 302 lb 6 oz (137.2 kg)   BMI 51.90 kg/m    Constitutional:   Pleasant obese Caucasian female appears to be in NAD, Well developed, Well nourished, alert and cooperative Respiratory: Respirations even and unlabored. Lungs clear to auscultation bilaterally.   No wheezes, crackles, or rhonchi.  Cardiovascular: Normal S1, S2. No MRG. Regular rate and rhythm. No peripheral edema, cyanosis or pallor.  Gastrointestinal:  Soft, nondistended, nontender. No rebound or guarding. Normal bowel sounds. No appreciable masses or hepatomegaly. Rectal:  Not performed.  Psychiatric: Demonstrates good judgement and reason without abnormal affect or behaviors.  RELEVANT LABS AND IMAGING: CBC    Component Value Date/Time   WBC 8.9 09/08/2023 1538   RBC 5.06 09/08/2023 1538   HGB 12.6 09/08/2023 1538   HGB 11.3 02/09/2018 1015   HCT 41.8 09/08/2023 1538   HCT 35.9 02/09/2018 1015   PLT 383 09/08/2023 1538   PLT 288 02/09/2018 1015   MCV 82.6 09/08/2023 1538   MCV 77 (L) 02/09/2018 1015   MCH 24.9 (L) 09/08/2023 1538   MCHC 30.1 09/08/2023 1538   RDW 14.2 09/08/2023 1538   RDW 14.3 02/09/2018 1015   LYMPHSABS 2.5 09/08/2023 1538   LYMPHSABS 2.2 02/09/2018 1015   MONOABS 0.6  09/08/2023 1538   EOSABS 0.1 09/08/2023 1538   EOSABS 0.1 02/09/2018 1015   BASOSABS 0.1 09/08/2023 1538   BASOSABS 0.0 02/09/2018 1015    CMP     Component Value Date/Time   NA 138 09/08/2023 1538   NA 139 02/09/2018 1015   K 3.9 09/08/2023 1538   CL 101 09/08/2023 1538   CO2 24 09/08/2023 1538   GLUCOSE 100 (H) 09/08/2023 1538   BUN 5 (L) 09/08/2023 1538   BUN 9 02/09/2018 1015   CREATININE 0.55 09/08/2023 1538   CALCIUM 9.2 09/08/2023 1538   PROT 7.7 09/08/2023 1538   PROT 7.0 02/09/2018 1015   ALBUMIN 3.1 (L) 09/08/2023 1538   ALBUMIN 3.7 02/09/2018 1015   AST 12 (L) 09/08/2023 1538   ALT 9 09/08/2023 1538   ALKPHOS 68 09/08/2023 1538   BILITOT 0.4 09/08/2023 1538   BILITOT 0.4 02/09/2018 1015   GFRNONAA >60 09/08/2023 1538   GFRAA >60 10/28/2019 2247    Assessment: 1.  Abdominal pain with diarrhea: Chronic GI symptoms for at least the past year plus of her life, previously discussed IBS with other providers, recently underwent EGD and colonoscopy without other cause, has tried the FODMAP diet in the past with no help, unable to get the Hyoscyamine so never tried this, did not start Colestipol  as  she wants to discuss this today, though diarrhea out dates her cholecystectomy in May be adding to the worsening now; consider bile salt induced diarrhea and IBS most likely 2.  Status postcholecystectomy: Gallbladder out in December 3.  Rectal bleeding: Hemorrhoids on colonoscopy recently which are likely the source, irritated by chronic diarrhea 4.  Iron deficiency: Now following with hematology and has received 1 iron infusions so far with no change in iron level per her and she does not feel any different yet, recent EGD and colonoscopy with no cause, she is on continuous birth control with no unsterile cycle either so this is not contributing; needs further workup from hematology if iron infusions do not help  Plan: 1.  Discussed the Cara app for IBS with the patient today.   She is going to trial this and see if it is helpful. 2.  Prescribed Dicyclomine  20 mg to be taken 20 to 30 minutes before meals 4 times a day.  #120 with 2 refills. 3.  Patient has Colestipol  at home and is going to start 1 g twice daily.  Aware of timing of this medication.  She is going to try this for 3 or 4 days, if it is not helpful for diarrhea then she is going to add Dicyclomine . 4.  Discussed with patient that she seems to have a constellation of symptoms, had previously followed with an endocrinologist, we briefly discussed that she may want to follow-up with them to see if there is anything else going on. 5.  Continue to follow with hematology in regards to iron infusions. 6.  Prescribed Hydrocortisone suppositories twice daily for 7 days #14 and 1 refill given ongoing rectal bleeding 7.  Repeat GI path panel as diarrhea seems to have gotten worse recently.  Did discuss this could just be the cycle of IBS that she is very understandably frustrated with ongoing symptoms and no real cause found and no help yet. 8.  Had a long discussion about trying the therapy as above first.  This is not helpful then we will need to put our heads together to try and figure out something else for her.   9.  Patient will follow-up in clinic in 2 months.  She will stay in touch through MyChart in the meantime.  Delon Failing, PA-C [facility] 11/02/2023, 8:34 AM  Cc: Lendia Boby CROME, NP-C     [facility] Attending   I agree with the Advanced Practitioner's note, impression and recommendations with the following additions:  Depending upon the response to above, she can be considered to try a trial of alosetron (Lotronex) for IBS-D.  Lupita CHARLENA Commander, MD, NOLIA

## 2023-11-04 DIAGNOSIS — F413 Other mixed anxiety disorders: Secondary | ICD-10-CM | POA: Diagnosis not present

## 2023-11-15 ENCOUNTER — Ambulatory Visit: Admitting: Physician Assistant

## 2023-11-21 DIAGNOSIS — F330 Major depressive disorder, recurrent, mild: Secondary | ICD-10-CM | POA: Diagnosis not present

## 2023-11-21 DIAGNOSIS — F909 Attention-deficit hyperactivity disorder, unspecified type: Secondary | ICD-10-CM | POA: Diagnosis not present

## 2023-11-21 DIAGNOSIS — F419 Anxiety disorder, unspecified: Secondary | ICD-10-CM | POA: Diagnosis not present

## 2023-11-25 DIAGNOSIS — F413 Other mixed anxiety disorders: Secondary | ICD-10-CM | POA: Diagnosis not present

## 2023-12-02 ENCOUNTER — Telehealth: Payer: Self-pay

## 2023-12-02 NOTE — Progress Notes (Signed)
 Thank you for this referral. The [facility] team receives referrals for patients who meet Complex Care Management program criteria: chronic conditions including heart failure, stroke, COPD, ESRD, Sickle Cell, Diabetes with complications, Mental/Behavioral Health diagnosis, substance abuse/misuse and whose Primary Care Provider is a [facility] provider or ACO contracted building control surveyor in [place]).  Does not meet Complex Care Management program criteria. Pt has never had medicaid

## 2023-12-13 ENCOUNTER — Other Ambulatory Visit: Payer: Self-pay | Admitting: Nurse Practitioner

## 2023-12-13 DIAGNOSIS — D500 Iron deficiency anemia secondary to blood loss (chronic): Secondary | ICD-10-CM

## 2023-12-13 NOTE — Assessment & Plan Note (Addendum)
 This is a 33 y.o. female with a history of menorrhagia, PCOS, morbid obesity, anxiety, and ADHD. She was referred from GI provider. She has had many years of experiencing a myriad of GI problems, including chronic diarrhea, anal fissures with rectal bleeding, and nausea with or without vomiting. She was found to have multiple gallstones during a work up of this pain with her primary care provider. The patient had gallbladder removal in 12/2023. The GI symptoms did not resolve, and she was referred to GI.  During her initial GI work up, the patient was found to be iron deficient. This was felt to be due to menorrhagia and diagnosis if PCOS. The patient is on oral contraceptive pill now, and has not had a heavy period in several months. A check of her ferritin on 02/09/2023 was 5.7. a recheck on 06/10/2023 showed her ferritin improved, only to 10. Throughout the lengthy work up, the patient's CBC has remained essentially normal, with low ferritin and low iron saturation %.  Her most recent labs, doen by her GI provider were done on 08/09/2023. She had a normal CBC. Her ferritin wsa 5.4. B12 level was low/normal at 295. Iron was 49, transferrin 357, saturation of 7.8%, and TIBC 499.8.  She had negative workup for celiac disease.  She is scheduled to have colonoscopy and EGD on 09/29/2023. The patient also sees GYN provider on routine basis. She was started on OCP with estrogen to try to improve heavy menstrual bleeding.  Previously, the patient was having extremely heavy menstrual cycles.  She is now rapid cycling through the OCP. She is no longer having menstrual periods at all.  She had colonoscopy and endoscopy on 09/29/2023. She had tiny hemorrhoids with the remainder of the colonoscopy being normal. Endoscopy showed some evidence of inflammation, but was otherwise unremarkable. Biopsies were all negative for inflammation or evidence of malignancy. She was negative for H. Pylori.   Continue to check blood with full  anemia panels every 4 weeks. Add autoimmune panel and hypercoagulopathy panel with next blood draw.  Plan to follow up in 4 months, sooner if needed.

## 2023-12-13 NOTE — Progress Notes (Signed)
 Patient Care Team: Lendia Boby CROME, NP-C as PCP - General (Family Medicine) Ob/Gyn, [facility] Day:  12/14/2023  Referring physician: Lendia Boby CROME, NP-C  ASSESSMENT & PLAN:   Assessment & Plan: Iron deficiency anemia This is a 33 y.o. female with a history of menorrhagia, PCOS, morbid obesity, anxiety, and ADHD. She was referred from GI provider. She has had many years of experiencing a myriad of GI problems, including chronic diarrhea, anal fissures with rectal bleeding, and nausea with or without vomiting. She was found to have multiple gallstones during a work up of this pain with her primary care provider. The patient had gallbladder removal in 12/2023. The GI symptoms did not resolve, and she was referred to GI.  During her initial GI work up, the patient was found to be iron deficient. This was felt to be due to menorrhagia and diagnosis if PCOS. The patient is on oral contraceptive pill now, and has not had a heavy period in several months. A check of her ferritin on 02/09/2023 was 5.7. a recheck on 06/10/2023 showed her ferritin improved, only to 10. Throughout the lengthy work up, the patient's CBC has remained essentially normal, with low ferritin and low iron saturation %.  Her most recent labs, doen by her GI provider were done on 08/09/2023. She had a normal CBC. Her ferritin wsa 5.4. B12 level was low/normal at 295. Iron was 49, transferrin 357, saturation of 7.8%, and TIBC 499.8.  She had negative workup for celiac disease.  She is scheduled to have colonoscopy and EGD on 09/29/2023. The patient also sees GYN provider on routine basis. She was started on OCP with estrogen to try to improve heavy menstrual bleeding.  Previously, the patient was having extremely heavy menstrual cycles.  She is now rapid cycling through the OCP. She is no longer having menstrual periods at all.  She had colonoscopy and endoscopy on 09/29/2023. She had tiny hemorrhoids with the remainder of the  colonoscopy being normal. Endoscopy showed some evidence of inflammation, but was otherwise unremarkable. Biopsies were all negative for inflammation or evidence of malignancy. She was negative for H. Pylori.   Continue to check blood with full anemia panels every 4 weeks. Add autoimmune panel and hypercoagulopathy panel with next blood draw.  Plan to follow up in 4 months, sooner if needed.     Diarrhea/hematochezia Patient reports more frequent episodes of diarrhea with rectal bleeding.  Has noticed copious amount of blood in the toilet after having a bowel movement.  Most recent episode this was yesterday, 12/13/2023.  She underwent colonoscopy and endoscopy on 09/29/2023.  Colonoscopy showed mild external hemorrhoids without evidence of bleeding.  No other abnormalities were noted.  Endoscopy showed evidence of inflammation/gastritis.  Biopsies were negative for chronic inflammation.  She has been diagnosed with IBS.  She is followed by GI.  Her blood count is normal today other than slightly increased WBC at 11.9.  Her iron saturation is normal but low at 15%.  Her ferritin is good at 149.  She has been unable to tolerate oral iron due to GI side effects.  No requirement for iron infusions at this point.  Recommend she discuss persistent and worsening symptoms with GI provider.  Fatigue She does have moderate fatigue.  Has noted she is sleeping more.  She is not having menstrual period.  Says she had rapid cycles through OCP.  Has not changed her fatigue.  Again, Hgb, HCT, ferritin, and iron panel are  unremarkable.  She does have primary care provider.  Recommend she discuss symptoms with PCP.  Easy bruising She has noticed increase bruising.  She states small traumas are causing severe bruising, even hematomas.  Does not take NSAIDs or aspirin.  She is not on anticoagulant.  Platelet count is within normal limits.  Will check autoimmune panel with RA factor and consider referral to rheumatologist  based on symptomology.  Plan to check hypercoagulopathy panel also.  Plan Labs reviewed. - CBC unremarkable other than slight elevation of WBC. - Iron panel and ferritin unremarkable. Advised to discuss GI symptoms with her gastroenterologist.  May require additional workup. Encouraged her to discuss fatigue and easy bruising with primary care provider. Will check autoimmune and hypercoagulopathy panels with next lab draw. Continue to monitor labs every 4 weeks and follow-up with patient in 4 months.    The patient understands the plans discussed today and is in agreement with them.  She knows to contact our office if she develops concerns prior to her next appointment.  I provided 30 minutes of face-to-face time during this encounter and > 50% was spent counseling as documented under my assessment and plan.    Diem Pagnotta  FORBES Lessen, NP  [facility] - A DEPT OF [facility] [address] [place] [address] Dept: [phone] Dept Fax: [phone]   Orders Placed This Encounter  Procedures   Protime-INR    Standing Status:   Future    Expected Date:   01/13/2024    Expiration Date:   04/12/2024   APTT    Standing Status:   Future    Expected Date:   01/13/2024    Expiration Date:   04/12/2024   Fibrinogen    Standing Status:   Future    Expected Date:   01/13/2024    Expiration Date:   04/12/2024   ANA w/Reflex if Positive    Standing Status:   Future    Expected Date:   01/13/2024    Expiration Date:   04/12/2024   Sedimentation rate    Standing Status:   Future    Expected Date:   01/13/2024    Expiration Date:   04/12/2024   Rheumatoid factor    Standing Status:   Future    Expected Date:   01/13/2024    Expiration Date:   04/12/2024   CBC with Differential (Cancer Center Only)    Standing Status:   Standing    Number of Occurrences:   15    Expiration Date:   12/13/2024   Iron and Iron Binding  Capacity ([facility] only)    Standing Status:   Standing    Number of Occurrences:   15    Expiration Date:   12/13/2024   Ferritin    Standing Status:   Standing    Number of Occurrences:   15    Expiration Date:   12/13/2024      CHIEF COMPLAINT:  CC: iron deficiency anemai  Current Treatment:  IV iron for ferritin <50 or if symptomatic  INTERVAL HISTORY:  Mykayla is here today for repeat clinical assessment. Her initial visit with me was 09/20/2023. She did have 2 infusions of IV fereheme. She had colonoscopy and endoscopy on 09/29/2023. She had tiny hemorrhoids with the remainder of the colonoscopy being normal. Endoscopy showed some evidence of inflammation, but was otherwise unremarkable. Biopsies were all negative for inflammation or evidence  of malignancy. She was negative for H. Pylori.  She has noticed increased bruising.  States that small traumas are causing severe bruising and sometimes, hematomas.  No longer having menstrual cycles.  GYN provider suggested rapid cycling through OCPs.  Had significant episode of diarrhea with rectal bleeding yesterday.  Noted excessive blood in the toilet after bowel movement.  She is fatigued.  Has noted she is sleeping more often and for longer periods of time.  She denies chest pain, chest pressure, or shortness of breath. She denies headaches or visual disturbances. She denies fevers or chills. She denies pain. Her appetite is poor. Her weight has increased 4 pounds over last 6 weeks.  I have reviewed the past medical history, past surgical history, social history and family history with the patient and they are unchanged from previous note.  ALLERGIES:  is allergic to duricef [cefadroxil] and latex.  MEDICATIONS:  Current Outpatient Medications  Medication Sig Dispense Refill   amphetamine -dextroamphetamine  (ADDERALL XR) 30 MG 24 hr capsule Take 30 mg by mouth every morning.     cloNIDine (CATAPRES) 0.1 MG tablet Take 0.1 mg by mouth at  bedtime.     colestipol  (COLESTID ) 1 g tablet Take 2 tablets (2 g total) by mouth daily. Take with lunch (mid-day so as not to bind other medications) 60 tablet 3   Dermatological Products, Misc. (DERMAREST ROSACEA EX) Apply 1 Application topically in the morning and at bedtime. Triple rosacea cream (azelaic acid/ivermectin/metronidazole)     dicyclomine  (BENTYL ) 20 MG tablet Take 1 tablet (20 mg total) by mouth 3 (three) times daily before meals. 90 tablet 5   drospirenone-ethinyl estradiol  (YAZ) 3-0.02 MG tablet Take 1 tablet by mouth daily.     Ferrous Sulfate (IRON PO) Take by mouth.     FLUoxetine  (PROZAC ) 40 MG capsule Take 40 mg by mouth every morning.     hydrocortisone  (ANUSOL -HC) 25 MG suppository Place 1 suppository (25 mg total) rectally 2 (two) times daily. 14 suppository 1   MELATONIN PO Take 5 mg by mouth at bedtime.     montelukast  (SINGULAIR ) 10 MG tablet Take 1 tablet (10 mg total) by mouth at bedtime. 30 tablet 5   NYSTATIN powder Apply topically as needed.     Olopatadine-Mometasone (RYALTRIS ) 665-25 MCG/ACT SUSP Place 2 sprays into the nose 2 (two) times daily as needed. 29 g 5   ondansetron  (ZOFRAN -ODT) 4 MG disintegrating tablet Take 1 tablet (4 mg total) by mouth every 6 (six) hours as needed for nausea or vomiting. 20 tablet 0   polyethylene glycol (MIRALAX / GLYCOLAX) 17 g packet Take 17 g by mouth in the morning. (Patient taking differently: Take 17 g by mouth daily as needed for mild constipation.)     triamcinolone  (KENALOG ) 0.025 % ointment Apply 1 Application topically 2 (two) times daily. 30 g 3   Vitamin D , Ergocalciferol , (DRISDOL ) 1.25 MG (50000 UNIT) CAPS capsule Take 1 capsule (50,000 Units total) by mouth every 7 (seven) days. 12 capsule 0   acetic acid -hydrocortisone  (VOSOL -HC) OTIC solution Place 3 drops into the left ear 3 (three) times daily. 10 mL 1   azithromycin  (ZITHROMAX ) 250 MG tablet Take 2 tablets on day 1, then 1 tablet daily on days 2 through 5 6  tablet 0   No current facility-administered medications for this visit.    REVIEW OF SYSTEMS:   Constitutional: Denies fevers, chills or abnormal weight loss. Increased fatigue.  Eyes: Denies blurriness of vision Ears, nose, mouth, throat,  and face: Denies mucositis or sore throat Respiratory: Denies cough, dyspnea or wheezes Cardiovascular: Denies palpitation, chest discomfort or lower extremity swelling Gastrointestinal:  Denies nausea, heartburn or change in bowel habits. Episode of diarrhea along with bleeding. Noted copious blood in the toilet after bowel movement.  Skin: Denies abnormal skin rashes Lymphatics: Denies new lymphadenopathy or easy bruising Neurological:Denies numbness, tingling or new weaknesses Behavioral/Psych: Mood is stable, no new changes  All other systems were reviewed with the patient and are negative.   VITALS:   Today's Vitals   12/14/23 1003 12/14/23 1012  BP: 126/88   Pulse: (!) 109   Temp: 97.8 F (36.6 C)   SpO2: 97%   Weight: 299 lb (135.6 kg)   PainSc:  4    Body mass index is 51.32 kg/m.   Wt Readings from Last 3 Encounters:  12/20/23 (!) 306 lb (138.8 kg)  12/14/23 299 lb (135.6 kg)  11/02/23 (!) 302 lb 6 oz (137.2 kg)    Body mass index is 51.32 kg/m.  Performance status (ECOG): 2 - Symptomatic, <50% confined to bed  PHYSICAL EXAM:   GENERAL:alert, no distress and comfortable SKIN: skin color, texture, turgor are normal, no rashes or significant lesions EYES: normal, Conjunctiva are pink and non-injected, sclera clear OROPHARYNX:no exudate, no erythema and lips, buccal mucosa, and tongue normal  NECK: supple, thyroid  normal size, non-tender, without nodularity LYMPH:  no palpable lymphadenopathy in the cervical, axillary or inguinal LUNGS: clear to auscultation and percussion with normal breathing effort HEART: regular rate & rhythm and no murmurs and no lower extremity edema ABDOMEN:abdomen soft, non-tender and normal  bowel sounds Musculoskeletal:no cyanosis of digits and no clubbing  NEURO: alert & oriented x 3 with fluent speech, no focal motor/sensory deficits  LABORATORY DATA:  I have reviewed the data as listed    Component Value Date/Time   NA 138 09/08/2023 1538   NA 139 02/09/2018 1015   K 3.9 09/08/2023 1538   CL 101 09/08/2023 1538   CO2 24 09/08/2023 1538   GLUCOSE 100 (H) 09/08/2023 1538   BUN 5 (L) 09/08/2023 1538   BUN 9 02/09/2018 1015   CREATININE 0.55 09/08/2023 1538   CALCIUM 9.2 09/08/2023 1538   PROT 7.7 09/08/2023 1538   PROT 7.0 02/09/2018 1015   ALBUMIN 3.1 (L) 09/08/2023 1538   ALBUMIN 3.7 02/09/2018 1015   AST 12 (L) 09/08/2023 1538   ALT 9 09/08/2023 1538   ALKPHOS 68 09/08/2023 1538   BILITOT 0.4 09/08/2023 1538   BILITOT 0.4 02/09/2018 1015   GFRNONAA >60 09/08/2023 1538   GFRAA >60 10/28/2019 2247    Lab Results  Component Value Date   WBC 11.9 (H) 12/14/2023   NEUTROABS 9.2 (H) 12/14/2023   HGB 14.2 12/14/2023   HCT 43.2 12/14/2023   MCV 85.4 12/14/2023   PLT 328 12/14/2023    Iron/TIBC/Ferritin/ %Sat    Component Value Date/Time   IRON 62 12/14/2023 0932   TIBC 403 12/14/2023 0932   FERRITIN 169 12/14/2023 0933   IRONPCTSAT 15 12/14/2023 0932   IRONPCTSAT 5 (L) 11/10/2022 1534

## 2023-12-14 ENCOUNTER — Inpatient Hospital Stay (HOSPITAL_BASED_OUTPATIENT_CLINIC_OR_DEPARTMENT_OTHER): Admitting: Nurse Practitioner

## 2023-12-14 ENCOUNTER — Inpatient Hospital Stay: Attending: Nurse Practitioner

## 2023-12-14 VITALS — BP 126/88 | HR 109 | Temp 97.8°F | Wt 299.0 lb

## 2023-12-14 DIAGNOSIS — R197 Diarrhea, unspecified: Secondary | ICD-10-CM | POA: Insufficient documentation

## 2023-12-14 DIAGNOSIS — K921 Melena: Secondary | ICD-10-CM | POA: Diagnosis not present

## 2023-12-14 DIAGNOSIS — R233 Spontaneous ecchymoses: Secondary | ICD-10-CM | POA: Insufficient documentation

## 2023-12-14 DIAGNOSIS — K2970 Gastritis, unspecified, without bleeding: Secondary | ICD-10-CM | POA: Insufficient documentation

## 2023-12-14 DIAGNOSIS — D649 Anemia, unspecified: Secondary | ICD-10-CM

## 2023-12-14 DIAGNOSIS — D500 Iron deficiency anemia secondary to blood loss (chronic): Secondary | ICD-10-CM

## 2023-12-14 DIAGNOSIS — R5383 Other fatigue: Secondary | ICD-10-CM | POA: Insufficient documentation

## 2023-12-14 DIAGNOSIS — N920 Excessive and frequent menstruation with regular cycle: Secondary | ICD-10-CM | POA: Diagnosis not present

## 2023-12-14 DIAGNOSIS — E282 Polycystic ovarian syndrome: Secondary | ICD-10-CM | POA: Insufficient documentation

## 2023-12-14 LAB — CBC WITH DIFFERENTIAL (CANCER CENTER ONLY)
Abs Immature Granulocytes: 0.04 K/uL (ref 0.00–0.07)
Basophils Absolute: 0.1 K/uL (ref 0.0–0.1)
Basophils Relative: 0 %
Eosinophils Absolute: 0.1 K/uL (ref 0.0–0.5)
Eosinophils Relative: 1 %
HCT: 43.2 % (ref 36.0–46.0)
Hemoglobin: 14.2 g/dL (ref 12.0–15.0)
Immature Granulocytes: 0 %
Lymphocytes Relative: 17 %
Lymphs Abs: 2 K/uL (ref 0.7–4.0)
MCH: 28.1 pg (ref 26.0–34.0)
MCHC: 32.9 g/dL (ref 30.0–36.0)
MCV: 85.4 fL (ref 80.0–100.0)
Monocytes Absolute: 0.5 K/uL (ref 0.1–1.0)
Monocytes Relative: 5 %
Neutro Abs: 9.2 K/uL — ABNORMAL HIGH (ref 1.7–7.7)
Neutrophils Relative %: 77 %
Platelet Count: 328 K/uL (ref 150–400)
RBC: 5.06 MIL/uL (ref 3.87–5.11)
RDW: 14.6 % (ref 11.5–15.5)
WBC Count: 11.9 K/uL — ABNORMAL HIGH (ref 4.0–10.5)
nRBC: 0 % (ref 0.0–0.2)

## 2023-12-14 LAB — FERRITIN: Ferritin: 169 ng/mL (ref 11–307)

## 2023-12-14 LAB — IRON AND IRON BINDING CAPACITY (CC-WL,HP ONLY)
Iron: 62 ug/dL (ref 28–170)
Saturation Ratios: 15 % (ref 10.4–31.8)
TIBC: 403 ug/dL (ref 250–450)
UIBC: 342 ug/dL

## 2023-12-14 LAB — VITAMIN B12: Vitamin B-12: 443 pg/mL (ref 180–914)

## 2023-12-16 ENCOUNTER — Ambulatory Visit: Payer: Self-pay | Admitting: Nurse Practitioner

## 2023-12-19 ENCOUNTER — Ambulatory Visit: Payer: Self-pay

## 2023-12-19 NOTE — Telephone Encounter (Signed)
 FYI Only or Action Required?: FYI only for provider: appointment scheduled on 11/25.  Patient was last seen in primary care on 09/12/2023 by Alvia Corean CROME, FNP.  Called Nurse Triage reporting Otalgia.  Symptoms began 2 days ago.  Symptoms are: gradually worsening.  Triage Disposition: See Physician Within 24 Hours  Patient/caregiver understands and will follow disposition?: Yes     Copied from CRM #[id number]. Topic: Clinical - Red Word Triage >> Dec 19, 2023  2:07 PM Rosina BIRCH wrote: Red Word that prompted transfer to Nurse Triage:patient stated she was light headed and dizzy over the weekend and that is when she knew her ears were blocked. Patient stated she is having a headache and ear pain       Reason for Disposition  Earache  (Exceptions: Brief ear pain of lasting less than 60 minutes, or earache occurring during air travel.)  Answer Assessment - Initial Assessment Questions 1. LOCATION: Which ear is involved?     Bilateral  2. ONSET: When did the ear pain start?      2 days ago  3. SEVERITY: How bad is the pain?  (Scale 1-10; mild, moderate or severe)     4/10 4. URI SYMPTOMS: Do you have a runny nose or cough?     Nasal congestion  5. FEVER: Do you have a fever? If Yes, ask: What is your temperature, how was it measured, and when did it start?     No 6. CAUSE: Have you been swimming recently?, How often do you use Q-TIPS?, Have you had any recent air travel or scuba diving?     Unsure  7. OTHER SYMPTOMS: Do you have any other symptoms? (e.g., decreased hearing, dizziness, headache, stiff neck, vomiting)     Intermittent dizziness, headache, neck pain  Protocols used: Earache-A-AH

## 2023-12-20 ENCOUNTER — Encounter: Payer: Self-pay | Admitting: Emergency Medicine

## 2023-12-20 ENCOUNTER — Ambulatory Visit: Admitting: Emergency Medicine

## 2023-12-20 VITALS — BP 138/88 | HR 106 | Temp 98.0°F | Ht 64.0 in | Wt 306.0 lb

## 2023-12-20 DIAGNOSIS — B9689 Other specified bacterial agents as the cause of diseases classified elsewhere: Secondary | ICD-10-CM | POA: Diagnosis not present

## 2023-12-20 DIAGNOSIS — H9203 Otalgia, bilateral: Secondary | ICD-10-CM | POA: Insufficient documentation

## 2023-12-20 DIAGNOSIS — J329 Chronic sinusitis, unspecified: Secondary | ICD-10-CM

## 2023-12-20 MED ORDER — AZITHROMYCIN 250 MG PO TABS: ORAL_TABLET | ORAL | 0 refills | Status: AC

## 2023-12-20 MED ORDER — HYDROCORTISONE-ACETIC ACID 1-2 % OT SOLN: 3.0000 [drp] | Freq: Three times a day (TID) | OTIC | 1 refills | Status: AC

## 2023-12-20 NOTE — Assessment & Plan Note (Signed)
 Clinically stable.  No red flag signs or symptoms Afebrile. Symptom management discussed Recommend to start Augmentin  875 mg twice a day for the next 7 days Advised to rest and stay well-hydrated Advised to contact the office if no better or worse during the next several days. Also recommend to follow-up with PCP

## 2023-12-20 NOTE — Patient Instructions (Signed)

## 2023-12-20 NOTE — Assessment & Plan Note (Signed)
 Symptom management discussed Continue Tylenol  and or Advil  for pain Recommend VoSoL  otic solution 3 times a day both ears

## 2023-12-20 NOTE — Progress Notes (Signed)
 Amanda Wilson 33 y.o.   Chief Complaint  Patient presents with   Ear Pain    Sinus issues, headaches, pain in the neck     HISTORY OF PRESENT ILLNESS: This is a 33 y.o. female complaining of bilateral ear pains along with sinus congestion and occasional headache with radiation to her neck for the last couple of weeks No other associated symptoms No other complaints or medical concerns today.  HPI   Prior to Admission medications   Medication Sig Start Date End Date Taking? Authorizing Provider  acetic acid -hydrocortisone  (VOSOL -HC) OTIC solution Place 3 drops into the left ear 3 (three) times daily. 12/20/23  Yes Odel Schmid, Emil Schanz, MD  amphetamine -dextroamphetamine  (ADDERALL XR) 30 MG 24 hr capsule Take 30 mg by mouth every morning. 06/03/23  Yes [provider]  azithromycin  (ZITHROMAX ) 250 MG tablet Take 2 tablets on day 1, then 1 tablet daily on days 2 through 5 12/20/23 12/25/23 Yes Json Koelzer, Emil Schanz, MD  cloNIDine (CATAPRES) 0.1 MG tablet Take 0.1 mg by mouth at bedtime. 06/04/23  Yes [provider]  colestipol  (COLESTID ) 1 g tablet Take 2 tablets (2 g total) by mouth daily. Take with lunch (mid-day so as not to bind other medications) 10/03/23  Yes Avram Lupita BRAVO, MD  Dermatological Products, Misc. (DERMAREST ROSACEA EX) Apply 1 Application topically in the morning and at bedtime. Triple rosacea cream (azelaic acid/ivermectin/metronidazole)   Yes [provider]  dicyclomine  (BENTYL ) 20 MG tablet Take 1 tablet (20 mg total) by mouth 3 (three) times daily before meals. 11/02/23  Yes Beather Delon Gibson, PA  drospirenone-ethinyl estradiol  (YAZ) 3-0.02 MG tablet Take 1 tablet by mouth daily. 04/20/23  Yes [provider]  Ferrous Sulfate (IRON PO) Take by mouth.   Yes [provider]  FLUoxetine  (PROZAC ) 40 MG capsule Take 40 mg by mouth every morning. 10/21/23  Yes [provider]  hydrocortisone  (ANUSOL -HC) 25 MG  suppository Place 1 suppository (25 mg total) rectally 2 (two) times daily. 11/02/23  Yes Beather Delon Gibson, PA  MELATONIN PO Take 5 mg by mouth at bedtime.   Yes [provider]  montelukast  (SINGULAIR ) 10 MG tablet Take 1 tablet (10 mg total) by mouth at bedtime. 08/05/23  Yes Ambs, Arlean HERO, FNP  NYSTATIN powder Apply topically as needed. 07/01/23  Yes [provider]  Olopatadine-Mometasone (RYALTRIS ) 665-25 MCG/ACT SUSP Place 2 sprays into the nose 2 (two) times daily as needed. 05/06/23  Yes Ambs, Arlean HERO, FNP  ondansetron  (ZOFRAN -ODT) 4 MG disintegrating tablet Take 1 tablet (4 mg total) by mouth every 6 (six) hours as needed for nausea or vomiting. 09/08/23  Yes Alva Mort F, PA-C  polyethylene glycol (MIRALAX / GLYCOLAX) 17 g packet Take 17 g by mouth in the morning. Patient taking differently: Take 17 g by mouth daily as needed for mild constipation.   Yes [provider]  triamcinolone  (KENALOG ) 0.025 % ointment Apply 1 Application topically 2 (two) times daily. 04/28/23  Yes Iva Marty Saltness, MD  Vitamin D , Ergocalciferol , (DRISDOL ) 1.25 MG (50000 UNIT) CAPS capsule Take 1 capsule (50,000 Units total) by mouth every 7 (seven) days. 06/15/23  Yes Henson, Vickie L, NP-C    Allergies  Allergen Reactions   Duricef [Cefadroxil]     Skin sloughing   Latex Rash    Patient Active Problem List   Diagnosis Date Noted   Generalized abdominal pain 09/29/2023   Hematochezia 09/29/2023   Symptomatic anemia 09/20/2023   Cellulitis of  nose, external 09/12/2023   Allergic contact dermatitis 08/05/2023   Situational anxiety 06/10/2023   Postprandial abdominal bloating 06/10/2023   Diarrhea 06/10/2023   Nausea 06/10/2023   Seasonal and perennial allergic rhinitis 05/06/2023   Recurrent infections 05/06/2023   Iron deficiency anemia 12/13/2022   Vitamin D  deficiency 12/13/2022   Severe obesity (BMI >= 40) (HCC) 12/13/2022   Menorrhagia with regular cycle  12/13/2022   ADHD 05/11/2022   Dysmenorrhea 06/24/2016   Anxiety and depression 06/24/2016   Hirsutism 07/17/2013   Obesity 02/06/2013   Family history of PCOS 01/30/2013   Hair thinning 01/30/2013   Acne 05/12/2012    Past Medical History:  Diagnosis Date   ADHD (attention deficit hyperactivity disorder)    Allergy     Anal fissure    Anemia    Anxiety    Complication of anesthesia    said nerve block didn't work on finger   COVID-19    early February   Depression    Family history of adverse reaction to anesthesia    Father- slow to awaken also- father had part of lung remved for an Empyema.   Frequent headaches    migraines- maybe 1 once a month since starting to take Topmax on a daily basis   Gallstones    Neuromuscular disorder (HCC)    nerve pain in right small finger after surgery   Obesity    Pneumonia    as a  child    Past Surgical History:  Procedure Laterality Date   CHOLECYSTECTOMY N/A 01/11/2023   Procedure: LAPAROSCOPIC CHOLECYSTECTOMY WITH INTRAOPERATIVE CHOLANGIOGRAM;  Surgeon: Belinda Cough, MD;  Location: [facility] OR;  Service: General;  Laterality: N/A;  RNFA Assist   COLONOSCOPY N/A 09/29/2023   Procedure: COLONOSCOPY;  Surgeon: Avram Lupita BRAVO, MD;  Location: [facility];  Service: Gastroenterology;  Laterality: N/A;   ESOPHAGOGASTRODUODENOSCOPY N/A 09/29/2023   Procedure: EGD (ESOPHAGOGASTRODUODENOSCOPY);  Surgeon: Avram Lupita BRAVO, MD;  Location: [facility];  Service: Gastroenterology;  Laterality: N/A;   HARDWARE REMOVAL Right 05/05/2020   Procedure: RIGHT HAND HARDWARE REMOVAL AND SMALL FINGER METACARPOPHALANGEAL JOINT RELEASE;  Surgeon: Sebastian Lenis, MD;  Location: [facility] OR;  Service: Orthopedics;  Laterality: Right;   OPEN REDUCTION INTERNAL FIXATION (ORIF) PROXIMAL PHALANX Right 11/05/2019   Procedure: OPEN TREATMENT OF RIGHT RING FINGER FRACTURE AND 5TH METACARPAL FRACTURE;  Surgeon: Sebastian Lenis, MD;  Location: [facility] OR;  Service: Orthopedics;   Laterality: Right;  LENGTH OF SURGERY: 90 MIN  PRE OP BLOCK    Social History   Socioeconomic History   Marital status: Single    Spouse name: Not on file   Number of children: 0   Years of education: Not on file   Highest education level: Master's degree (e.g., MA, MS, MEng, MEd, MSW, MBA)  Occupational History   Not on file  Tobacco Use   Smoking status: Never    Passive exposure: Never   Smokeless tobacco: Never  Vaping Use   Vaping status: Never Used  Substance and Sexual Activity   Alcohol use: Not Currently   Drug use: Never   Sexual activity: Not Currently    Partners: Male    Comment: Nuvaring  Other Topics Concern   Not on file  Social History Narrative   Not on file   Social Drivers of Health   Financial Resource Strain: Low Risk  (12/19/2023)   Overall Financial Resource Strain (CARDIA)    Difficulty of Paying Living Expenses: Not very hard  Food Insecurity:  No Food Insecurity (12/19/2023)   Hunger Vital Sign    Worried About Running Out of Food in the Last Year: Never true    Ran Out of Food in the Last Year: Never true  Transportation Needs: No Transportation Needs (12/19/2023)   PRAPARE - Administrator, Civil Service (Medical): No    Lack of Transportation (Non-Medical): No  Physical Activity: Inactive (12/19/2023)   Exercise Vital Sign    Days of Exercise per Week: 0 days    Minutes of Exercise per Session: Not on file  Stress: Stress Concern Present (12/19/2023)   [employer] of Occupational Health - Occupational Stress Questionnaire    Feeling of Stress: Very much  Social Connections: Socially Isolated (12/19/2023)   Social Connection and Isolation Panel    Frequency of Communication with Friends and Family: More than three times a week    Frequency of Social Gatherings with Friends and Family: Once a week    Attends Religious Services: Never    Database Administrator or Organizations: No    Attends Hospital Doctor: Not on file    Marital Status: Never married  Intimate Partner Violence: Not At Risk (09/20/2023)   Humiliation, Afraid, Rape, and Kick questionnaire    Fear of Current or Ex-Partner: No    Emotionally Abused: No    Physically Abused: No    Sexually Abused: No    Family History  Problem Relation Age of Onset   Allergic rhinitis Mother    Cancer Mother        skin   Depression Mother    Anxiety disorder Mother    Arthritis Mother    Allergic rhinitis Father    Heart disease Father    Stroke Father    Diabetes Father    Atrial fibrillation Father    Allergic rhinitis Sister    Depression Sister    Anxiety disorder Sister    Other Sister        some form of celiac   Urticaria Brother    Eczema Brother    Asthma Brother    Allergic rhinitis Brother    Anxiety disorder Brother    Dementia Maternal Grandfather    Atrial fibrillation Paternal Grandfather    Depression Maternal Aunt    Anxiety disorder Maternal Aunt    Depression Cousin    Angioedema Neg Hx      Review of Systems  Constitutional: Negative.  Negative for chills and fever.  HENT:  Positive for congestion and ear pain. Negative for sore throat.   Respiratory: Negative.  Negative for cough and shortness of breath.   Cardiovascular: Negative.  Negative for chest pain and palpitations.  Gastrointestinal:  Negative for abdominal pain, diarrhea, nausea and vomiting.  Genitourinary: Negative.  Negative for dysuria and hematuria.  Skin: Negative.  Negative for rash.  Neurological: Negative.  Negative for dizziness and headaches.  All other systems reviewed and are negative.   Vitals:   12/20/23 1046  BP: 138/88  Pulse: (!) 106  Temp: 98 F (36.7 C)  SpO2: 93%    Physical Exam Vitals reviewed.  Constitutional:      Appearance: Normal appearance.  HENT:     Head: Normocephalic.     Right Ear: Tympanic membrane normal.     Left Ear: Tympanic membrane normal.     Ears:     Comments: Mild  hyperemic and tender external canals bilaterally    Nose: Congestion present.  Mouth/Throat:     Mouth: Mucous membranes are moist.     Pharynx: Oropharynx is clear.  Eyes:     Extraocular Movements: Extraocular movements intact.     Conjunctiva/sclera: Conjunctivae normal.     Pupils: Pupils are equal, round, and reactive to light.  Cardiovascular:     Rate and Rhythm: Normal rate and regular rhythm.     Pulses: Normal pulses.     Heart sounds: Normal heart sounds.  Pulmonary:     Effort: Pulmonary effort is normal.     Breath sounds: Normal breath sounds.  Musculoskeletal:     Cervical back: No tenderness.  Lymphadenopathy:     Cervical: No cervical adenopathy.  Skin:    General: Skin is warm and dry.     Capillary Refill: Capillary refill takes less than 2 seconds.  Neurological:     General: No focal deficit present.     Mental Status: She is alert and oriented to person, place, and time.  Psychiatric:        Mood and Affect: Mood normal.        Behavior: Behavior normal.      ASSESSMENT & PLAN: I personally spent a total of 30 minutes minutes in the care of the patient today including preparing to see the patient, getting/reviewing separately obtained history, performing a medically appropriate exam/evaluation, counseling and educating, placing orders, documenting clinical information in the EHR, coordinating care, and symptom management, prognosis, need for follow-up with PCP if no better or worse during the next several days or weeks..  Problem List Items Addressed This Visit       Respiratory   Bacterial sinusitis - Primary   Clinically stable.  No red flag signs or symptoms Afebrile. Symptom management discussed Recommend to start Augmentin  875 mg twice a day for the next 7 days Advised to rest and stay well-hydrated Advised to contact the office if no better or worse during the next several days. Also recommend to follow-up with PCP      Relevant  Medications   acetic acid -hydrocortisone  (VOSOL -HC) OTIC solution   azithromycin  (ZITHROMAX ) 250 MG tablet     Nervous and Auditory   Acute otalgia, bilateral   Symptom management discussed Continue Tylenol  and or Advil  for pain Recommend VoSoL  otic solution 3 times a day both ears      Relevant Medications   acetic acid -hydrocortisone  (VOSOL -HC) OTIC solution   azithromycin  (ZITHROMAX ) 250 MG tablet   Patient Instructions  Sinus Infection, Adult A sinus infection is soreness and swelling (inflammation) of your sinuses. Sinuses are hollow spaces in the bones around your face. They are located: Around your eyes. In the middle of your forehead. Behind your nose. In your cheekbones. Your sinuses and nasal passages are lined with a fluid called mucus. Mucus drains out of your sinuses. Swelling can trap mucus in your sinuses. This lets germs (bacteria, virus, or fungus) grow, which leads to infection. Most of the time, this condition is caused by a virus. What are the causes? Allergies. Asthma. Germs. Things that block your nose or sinuses. Growths in the nose (nasal polyps). Chemicals or irritants in the air. A fungus. This is rare. What increases the risk? Having a weak body defense system (immune system). Doing a lot of swimming or diving. Using nasal sprays too much. Smoking. What are the signs or symptoms? The main symptoms of this condition are pain and a feeling of pressure around the sinuses. Other symptoms include: Stuffy nose (congestion). This  may make it hard to breathe through your nose. Runny nose (drainage). Soreness, swelling, and warmth in the sinuses. A cough that may get worse at night. Being unable to smell and taste. Mucus that collects in the throat or the back of the nose (postnasal drip). This may cause a sore throat or bad breath. Being very tired (fatigued). A fever. How is this diagnosed? Your symptoms. Your medical history. A physical  exam. Tests to find out if your condition is short-term (acute) or long-term (chronic). Your doctor may: Check your nose for growths (polyps). Check your sinuses using a tool that has a light on one end (endoscope). Check for allergies or germs. Do imaging tests, such as an MRI or CT scan. How is this treated? Treatment for this condition depends on the cause and whether it is short-term or long-term. If caused by a virus, your symptoms should go away on their own within 10 days. You may be given medicines to relieve symptoms. They include: Medicines that shrink swollen tissue in the nose. A spray that treats swelling of the nostrils. Rinses that help get rid of thick mucus in your nose (nasal saline washes). Medicines that treat allergies (antihistamines). Over-the-counter pain relievers. If caused by bacteria, your doctor may wait to see if you will get better without treatment. You may be given antibiotic medicine if you have: A very bad infection. A weak body defense system. If caused by growths in the nose, surgery may be needed. Follow these instructions at home: Medicines Take, use, or apply over-the-counter and prescription medicines only as told by your doctor. These may include nasal sprays. If you were prescribed an antibiotic medicine, take it as told by your doctor. Do not stop taking it even if you start to feel better. Hydrate and humidify  Drink enough water to keep your pee (urine) pale yellow. Use a cool mist humidifier to keep the humidity level in your home above 50%. Breathe in steam for 10-15 minutes, 3-4 times a day, or as told by your doctor. You can do this in the bathroom while a hot shower is running. Try not to spend time in cool or dry air. Rest Rest as much as you can. Sleep with your head raised (elevated). Make sure you get enough sleep each night. General instructions  Put a warm, moist washcloth on your face 3-4 times a day, or as often as told by  your doctor. Use nasal saline washes as often as told by your doctor. Wash your hands often with soap and water. If you cannot use soap and water, use hand sanitizer. Do not smoke. Avoid being around people who are smoking (secondhand smoke). Keep all follow-up visits. Contact a doctor if: You have a fever. Your symptoms get worse. Your symptoms do not get better within 10 days. Get help right away if: You have a very bad headache. You cannot stop vomiting. You have very bad pain or swelling around your face or eyes. You have trouble seeing. You feel confused. Your neck is stiff. You have trouble breathing. These symptoms may be an emergency. Get help right away. Call 911. Do not wait to see if the symptoms will go away. Do not drive yourself to the hospital. Summary A sinus infection is swelling of your sinuses. Sinuses are hollow spaces in the bones around your face. This condition is caused by tissues in your nose that become inflamed or swollen. This traps germs. These can lead to infection. If you  were prescribed an antibiotic medicine, take it as told by your doctor. Do not stop taking it even if you start to feel better. Keep all follow-up visits. This information is not intended to replace advice given to you by your health care provider. Make sure you discuss any questions you have with your health care provider. Document Revised: 12/16/2020 Document Reviewed: 12/16/2020 Elsevier Patient Education  2024 [employer]    Emil Schaumann, MD [facility]

## 2023-12-25 ENCOUNTER — Encounter: Payer: Self-pay | Admitting: Nurse Practitioner

## 2023-12-29 ENCOUNTER — Other Ambulatory Visit: Payer: Self-pay

## 2023-12-29 ENCOUNTER — Ambulatory Visit: Admitting: Family Medicine

## 2023-12-29 ENCOUNTER — Encounter: Payer: Self-pay | Admitting: Family Medicine

## 2023-12-29 VITALS — BP 130/100 | HR 98 | Temp 98.7°F

## 2023-12-29 DIAGNOSIS — J3089 Other allergic rhinitis: Secondary | ICD-10-CM | POA: Diagnosis not present

## 2023-12-29 DIAGNOSIS — T7800XA Anaphylactic reaction due to unspecified food, initial encounter: Secondary | ICD-10-CM

## 2023-12-29 DIAGNOSIS — L2084 Intrinsic (allergic) eczema: Secondary | ICD-10-CM

## 2023-12-29 DIAGNOSIS — T7800XD Anaphylactic reaction due to unspecified food, subsequent encounter: Secondary | ICD-10-CM

## 2023-12-29 DIAGNOSIS — L239 Allergic contact dermatitis, unspecified cause: Secondary | ICD-10-CM

## 2023-12-29 DIAGNOSIS — J302 Other seasonal allergic rhinitis: Secondary | ICD-10-CM

## 2023-12-29 DIAGNOSIS — L719 Rosacea, unspecified: Secondary | ICD-10-CM

## 2023-12-29 DIAGNOSIS — B999 Unspecified infectious disease: Secondary | ICD-10-CM | POA: Diagnosis not present

## 2023-12-29 NOTE — Progress Notes (Unsigned)
 [address] [place] [address] Dept: [phone]  FOLLOW UP NOTE  Patient ID: Amanda Wilson, female    DOB: 24-Oct-1990  Age: 33 y.o. MRN: [id number] Date of Office Visit: 12/29/2023  Assessment  Chief Complaint: Allergic Reaction (States that she has had allergy  reaction to food unknown cause/After consuming certain food experiences stomach issues unknown cause) and Allergy  Testing  HPI Amanda Wilson is a 33 year old female who presents to the clinic for follow-up visit.  She was last seen in this clinic on 08/05/2023 by Arlean Mutter, FNP, for evaluation of allergic rhinitis, allergic conjunctivitis, recurrent infection, and allergic contact dermatitis.  In the interim, she visited the emergency department for abdominal issues where she had a CT scan indicating no acute problems identified.  She has had 2 episodes of cellulitis on her face 1 requiring Bactrim  which was shortly followed by a round of doxycycline .  On 12/20/2023 she received azithromycin  for bacterial sinusitis.    Her most recent environmental allergy  skin testing on 04/28/2023 which was positive to grass pollen, weed pollen, tree pollen, mold, dust mite, and cat.   Discussed the use of AI scribe software for clinical note transcription with the patient, who gave verbal consent to proceed.  History of Present Illness Amanda Wilson is a 33 year old female with a history of latex allergy  who presents with concerns about new food allergies and ongoing sinus issues. She was referred by her hematologist for further allergy  testing due to ongoing symptoms and reactions.  She experienced an allergic reaction to banana that was baked into banana bread which occurred about a week ago, with immediate burning in her mouth. Similar 'fuzzy mouth' reactions occur when consuming mangoes. She has a known latex allergy , which causes her skin to turn bright red and feel like a chemical burn upon contact. No breathing difficulties with  latex, but throat irritation and throat clearing with unusual sounds occurred after eating banana bread.  Previously, she could consume dried banana without issue unless consumed in large amounts, but now even a small amount of banana bread causes immediate symptoms. Similar reactions occur with mango and pineapple, initially attributed to acidity.  She has a history of sinus issues, including a recent bacterial sinus infection treated with antibiotics, which did not alleviate her symptoms. She experiences intermittent rapid drainage when looking down, a sensation of thick paste in her sinuses, and clear nasal discharge when able to blow her nose. She uses Ryaltris  nasal spray, which she finds effective, and montelukast  without issues. She reports dry eyes but does not use eye drops.  She has a history of eczema, primarily affecting her ears and hairline, for which she uses a steroid cream. She also experiences contact dermatitis occasionally, with her face developing 'micro hives.' She has rosacea and a habit of picking at her skin when anxious, leading to infections, particularly on her nose.  She reports feeling congested after eating. She reacts to her neighbor's marijuana smoke with allergy -like symptoms. She has been wearing a mask due to feeling unwell and to avoid spreading potential infection.  Her current medications are lsited in the chart.   Chart review: CLINICAL DATA:  Abdominal pain.   EXAM: CT ABDOMEN AND PELVIS WITH CONTRAST   TECHNIQUE: Multidetector CT imaging of the abdomen and pelvis was performed using the standard protocol following bolus administration of intravenous contrast.   RADIATION DOSE REDUCTION: This exam was performed according to the departmental dose-optimization program which includes automated exposure control, adjustment  of the mA and/or kV according to patient size and/or use of iterative reconstruction technique.   CONTRAST:  OMNIPAQUE   IOHEXOL  300 MG/ML  SOLN   COMPARISON:  None Available.   FINDINGS: Lower chest: No acute abnormality.   Hepatobiliary: No focal liver abnormality is seen. Status post cholecystectomy. No biliary dilatation.   Pancreas: Unremarkable. No pancreatic ductal dilatation or surrounding inflammatory changes.   Spleen: Normal in size without focal abnormality.   Adrenals/Urinary Tract: Adrenal glands are unremarkable. Kidneys are normal, without renal calculi, focal lesion, or hydronephrosis. Bladder is unremarkable.   Stomach/Bowel: Stomach is within normal limits. Appendix appears normal. No evidence of bowel wall thickening, distention, or inflammatory changes.   Vascular/Lymphatic: No significant vascular findings are present. No enlarged abdominal or pelvic lymph nodes.   Reproductive: Uterus and bilateral adnexa are unremarkable.   Other: No abdominal wall hernia or abnormality. No abdominopelvic ascites.   Musculoskeletal: No acute or significant osseous findings.   IMPRESSION: No definite abnormality seen in the abdomen or pelvis.     Electronically Signed   By: Lynwood Landy Raddle M.D.   On: 09/08/2023 18:13  Drug Allergies:  Allergies  Allergen Reactions   Duricef [Cefadroxil]     Skin sloughing   Latex Rash    Physical Exam: BP (!) 130/100   Pulse 98   Temp 98.7 F (37.1 C)   SpO2 99%    Physical Exam Vitals reviewed.  Constitutional:      Appearance: Normal appearance.  HENT:     Head: Normocephalic and atraumatic.     Right Ear: Tympanic membrane normal.     Left Ear: Tympanic membrane normal.     Nose:     Comments: Lungs clear to auscultation    Mouth/Throat:     Pharynx: Oropharynx is clear.  Eyes:     Conjunctiva/sclera: Conjunctivae normal.  Cardiovascular:     Rate and Rhythm: Normal rate and regular rhythm.     Heart sounds: Normal heart sounds. No murmur heard. Pulmonary:     Effort: Pulmonary effort is normal.     Breath sounds:  Normal breath sounds.     Comments: Lungs clear to auscultation Musculoskeletal:        General: Normal range of motion.     Cervical back: Normal range of motion and neck supple.  Skin:    General: Skin is warm and dry.  Neurological:     Mental Status: She is alert and oriented to person, place, and time.  Psychiatric:        Mood and Affect: Mood normal.        Behavior: Behavior normal.        Thought Content: Thought content normal.        Judgment: Judgment normal.     Assessment and Plan: 1. Allergy  with anaphylaxis due to food   2. Recurrent infections   3. Seasonal and perennial allergic rhinitis   4. Allergic contact dermatitis, unspecified trigger   5. Rosacea   6. Intrinsic atopic dermatitis     No orders of the defined types were placed in this encounter.   Patient Instructions  Allergic rhinitis Continue allergen avoidance measures directed toward grass pollen, weed pollen, tree pollen, mold, dust mite, and cat hair as listed below Begin montelukast  10 mg once a day to control allergy  symptoms. Patient cautioned that rarely some children/adults can experience behavioral changes after beginning montelukast . These side effects are rare, however, if you notice any change,  notify the clinic and discontinue montelukast .  Stop carbinoxamine  at this time. Begin taking cetirizine and loratadine as you had been  Continue Ryaltris  1-2 sprays in each nostril up to twice a day if needed for nasal symptoms.  In the right nostril, point the applicator out toward the right ear. In the left nostril, point the applicator out toward the left ear Consider saline nasal rinses as needed for nasal symptoms. Use this before any medicated nasal sprays for best result If your symptoms are not well-controlled with the treatment plan as listed above, consider allergen immunotherapy. If not interested in allergen immunotherapy, consider sublingual therapy directed toward dust mite  Atopic  dermatitis Continue a daily moisturizing routine Continue triamcinolone  to red and itchy areas underneath your face up to twice a day if needed Consider using Cln body wash products  Food allergy   Continue to avoid pineapple, mango, and banana.  In case of an allergic reaction, take cetirizine 10 mg once every 12-24 hours, and if life-threatening symptoms occur, inject with EpiPen  0.3 mg.  Allergic contact dermatitis We can consider doing patch testing in the future (metals, chemicals, etc).  Continue triamcinolone  0.025% ointment twice daily as needed for the ear.   Recurrent infections Lab work has been ordered to help us  evaluate your immune system.  We will call you when the results become available.  Call the clinic if this treatment plan is not working well for you  Follow up in 3 months or sooner if needed.  Consider bleach baths or CLn (information provided below) CLn BodyWash may be ordered online at [email or web address]  If CLn BodyWash is too expensive, may try diluted bleach baths...  Diluted bleach bath recipe and instructions:  Add  -  cup of common household bleach to a bathtub full of water. Soak the affected part of the body (below the head and neck) for about 10 minutes. Limit diluted bleach baths to no more than twice a week.  Do not submerge the head or face and be very careful to avoid getting the diluted bleach into the eyes.  Rinse off with fresh water and apply moisturizer.  Reducing Pollen Exposure The American Academy of All[employer] , Asthma and Immunology suggests the following steps to reduce your exposure to pollen during allergy  seasons. Do not hang sheets or clothing out to dry; pollen may collect on these items. Do not mow lawns or spend time around freshly cut grass; mowing stirs up pollen. Keep windows closed at night.  Keep car windows closed while driving. Minimize morning activities outdoors, a time when pollen counts are usually at their  highest. Stay indoors as much as possible when pollen counts or humidity is high and on windy days when pollen tends to remain in the air longer. Use air conditioning when possible.  Many air conditioners have filters that trap the pollen spores. Use a HEPA room air filter to remove pollen form the indoor air you breathe.  Return in about 3 months (around 03/28/2024), or if symptoms worsen or fail to improve.    Thank you for the opportunity to care for this patient.  Please do not hesitate to contact me with questions.  Arlean Mutter, FNP [facility] 

## 2023-12-29 NOTE — Patient Instructions (Addendum)
 Allergic rhinitis Continue allergen avoidance measures directed toward grass pollen, weed pollen, tree pollen, mold, dust mite, and cat hair as listed below Begin montelukast  10 mg once a day to control allergy  symptoms. Patient cautioned that rarely some children/adults can experience behavioral changes after beginning montelukast . These side effects are rare, however, if you notice any change, notify the clinic and discontinue montelukast .  Stop carbinoxamine  at this time. Begin taking cetirizine and loratadine as you had been  Continue Ryaltris  1-2 sprays in each nostril up to twice a day if needed for nasal symptoms.  In the right nostril, point the applicator out toward the right ear. In the left nostril, point the applicator out toward the left ear Consider saline nasal rinses as needed for nasal symptoms. Use this before any medicated nasal sprays for best result If your symptoms are not well-controlled with the treatment plan as listed above, consider allergen immunotherapy. If not interested in allergen immunotherapy, consider sublingual therapy directed toward dust mite  Atopic dermatitis Continue a daily moisturizing routine Continue triamcinolone  to red and itchy areas underneath your face up to twice a day if needed Consider using Cln body wash products  Food allergy   Continue to avoid pineapple, mango, and banana.  In case of an allergic reaction, take cetirizine 10 mg once every 12-24 hours, and if life-threatening symptoms occur, inject with EpiPen  0.3 mg.  Allergic contact dermatitis We can consider doing patch testing in the future (metals, chemicals, etc).  Continue triamcinolone  0.025% ointment twice daily as needed for the ear.   Recurrent infections Lab work has been ordered to help us  evaluate your immune system.  We will call you when the results become available.  Call the clinic if this treatment plan is not working well for you  Follow up in 3 months or sooner if  needed.  Consider bleach baths or CLn (information provided below) CLn BodyWash may be ordered online at [email or web address]  If CLn BodyWash is too expensive, may try diluted bleach baths...  Diluted bleach bath recipe and instructions:  Add  -  cup of common household bleach to a bathtub full of water. Soak the affected part of the body (below the head and neck) for about 10 minutes. Limit diluted bleach baths to no more than twice a week.  Do not submerge the head or face and be very careful to avoid getting the diluted bleach into the eyes.  Rinse off with fresh water and apply moisturizer.  Reducing Pollen Exposure The American Academy of Allergy , Asthma and Immunology suggests the following steps to reduce your exposure to pollen during allergy  seasons. Do not hang sheets or clothing out to dry; pollen may collect on these items. Do not mow lawns or spend time around freshly cut grass; mowing stirs up pollen. Keep windows closed at night.  Keep car windows closed while driving. Minimize morning activities outdoors, a time when pollen counts are usually at their highest. Stay indoors as much as possible when pollen counts or humidity is high and on windy days when pollen tends to remain in the air longer. Use air conditioning when possible.  Many air conditioners have filters that trap the pollen spores. Use a HEPA room air filter to remove pollen form the indoor air you breathe.  Control of Mold Allergen Mold and fungi can grow on a variety of surfaces provided certain temperature and moisture conditions exist.  Outdoor molds grow on plants, decaying vegetation and soil.  The major outdoor mold,  Alternaria and Cladosporium, are found in very high numbers during hot and dry conditions.  Generally, a late Summer - Fall peak is seen for common outdoor fungal spores.  Rain will temporarily lower outdoor mold spore count, but counts rise rapidly when the rainy period ends.  The most important  indoor molds are Aspergillus and Penicillium.  Dark, humid and poorly ventilated basements are ideal sites for mold growth.  The next most common sites of mold growth are the bathroom and the kitchen.  Outdoor [employer] Use air conditioning and keep windows closed Avoid exposure to decaying vegetation. Avoid leaf raking. Avoid grain handling. Consider wearing a face mask if working in moldy areas.  Indoor Mold Control Maintain humidity below 50%. Clean washable surfaces with 5% bleach solution. Remove sources e.g. Contaminated carpets.  Control of Dog or Cat Allergen Avoidance is the best way to manage a dog or cat allergy . If you have a dog or cat and are allergic to dog or cats, consider removing the dog or cat from the home. If you have a dog or cat but don't want to find it a new home, or if your family wants a pet even though someone in the household is allergic, here are some strategies that may help keep symptoms at bay:  Keep the pet out of your bedroom and restrict it to only a few rooms. Be advised that keeping the dog or cat in only one room will not limit the allergens to that room. Don't pet, hug or kiss the dog or cat; if you do, wash your hands with soap and water. High-efficiency particulate air (HEPA) cleaners run continuously in a bedroom or living room can reduce allergen levels over time. Regular use of a high-efficiency vacuum cleaner or a central vacuum can reduce allergen levels. Giving your dog or cat a bath at least once a week can reduce airborne allergen.   Control of Dust Mite Allergen Dust mites play a major role in allergic asthma and rhinitis. They occur in environments with high humidity wherever human skin is found. Dust mites absorb humidity from the atmosphere (ie, they do not drink) and feed on organic matter (including shed human and animal skin). Dust mites are a microscopic type of insect that you cannot see with the naked eye. High levels of dust  mites have been detected from mattresses, pillows, carpets, upholstered furniture, bed covers, clothes, soft toys and any woven material. The principal allergen of the dust mite is found in its feces. A gram of dust may contain 1,000 mites and 250,000 fecal particles. Mite antigen is easily measured in the air during house cleaning activities. Dust mites do not bite and do not cause harm to humans, other than by triggering allergies/asthma.  Ways to decrease your exposure to dust mites in your home:  1. Encase mattresses, box springs and pillows with a mite-impermeable barrier or cover  2. Wash sheets, blankets and drapes weekly in hot water (130 F) with detergent and dry them in a dryer on the hot setting.  3. Have the room cleaned frequently with a vacuum cleaner and a damp dust-mop. For carpeting or rugs, vacuuming with a vacuum cleaner equipped with a high-efficiency particulate air (HEPA) filter. The dust mite allergic individual should not be in a room which is being cleaned and should wait 1 hour after cleaning before going into the room.  4. Do not sleep on upholstered furniture (eg, couches).  5. If possible removing carpeting, upholstered  furniture and drapery from the home is ideal. Horizontal blinds should be eliminated in the rooms where the person spends the most time (bedroom, study, television room). Washable vinyl, roller-type shades are optimal.  6. Remove all non-washable stuffed toys from the bedroom. Wash stuffed toys weekly like sheets and blankets above.  7. Reduce indoor humidity to less than 50%. Inexpensive humidity monitors can be purchased at most hardware stores. Do not use a humidifier as can make the problem worse and are not recommended.

## 2023-12-30 ENCOUNTER — Encounter: Payer: Self-pay | Admitting: Family Medicine

## 2023-12-30 DIAGNOSIS — T7800XA Anaphylactic reaction due to unspecified food, initial encounter: Secondary | ICD-10-CM | POA: Insufficient documentation

## 2023-12-30 DIAGNOSIS — L2084 Intrinsic (allergic) eczema: Secondary | ICD-10-CM | POA: Insufficient documentation

## 2023-12-30 DIAGNOSIS — L719 Rosacea, unspecified: Secondary | ICD-10-CM | POA: Insufficient documentation

## 2023-12-31 LAB — ALLERGY PROFILE, FOOD

## 2024-01-04 ENCOUNTER — Ambulatory Visit: Payer: Self-pay | Admitting: Family Medicine

## 2024-01-04 LAB — STREP PNEUMONIAE 23 SEROTYPES IGG
Pneumo Ab Type 1*: 0.5 ug/mL — AB (ref >1.3)
Pneumo Ab Type 12 (12F)*: 0.2 ug/mL — AB (ref >1.3)
Pneumo Ab Type 14*: 5.3 ug/mL (ref >1.3)
Pneumo Ab Type 17 (17F)*: 1.3 ug/mL — AB (ref >1.3)
Pneumo Ab Type 19 (19F)*: 0.5 ug/mL — AB (ref >1.3)
Pneumo Ab Type 2*: 12.3 ug/mL (ref >1.3)
Pneumo Ab Type 20*: 1.8 ug/mL (ref >1.3)
Pneumo Ab Type 22 (22F)*: 2.8 ug/mL (ref >1.3)
Pneumo Ab Type 23 (23F)*: 0.9 ug/mL — AB (ref >1.3)
Pneumo Ab Type 26 (6B)*: 0.7 ug/mL — AB (ref >1.3)
Pneumo Ab Type 3*: 0.1 ug/mL — AB (ref >1.3)
Pneumo Ab Type 34 (10A)*: 1 ug/mL — AB (ref >1.3)
Pneumo Ab Type 4*: 0.2 ug/mL — AB (ref >1.3)
Pneumo Ab Type 43 (11A)*: 0.4 ug/mL — AB (ref >1.3)
Pneumo Ab Type 5*: 2.9 ug/mL (ref >1.3)
Pneumo Ab Type 51 (7F)*: 0.2 ug/mL — AB (ref >1.3)
Pneumo Ab Type 54 (15B)*: 1 ug/mL — AB (ref >1.3)
Pneumo Ab Type 56 (18C)*: 0.2 ug/mL — AB (ref >1.3)
Pneumo Ab Type 57 (19A)*: 1.3 ug/mL — AB (ref >1.3)
Pneumo Ab Type 68 (9V)*: 0.2 ug/mL — AB (ref >1.3)
Pneumo Ab Type 70 (33F)*: 0.6 ug/mL — AB (ref >1.3)
Pneumo Ab Type 8*: <0.3 ug/mL — AB (ref >1.3)
Pneumo Ab Type 9 (9N)*: 0.3 ug/mL — AB (ref >1.3)

## 2024-01-04 LAB — ALLERGY PROFILE, FOOD
Allergen Salmon IgE: <0.1 kU/L
Codfish IgE: <0.1 kU/L
F001-IgE Egg White: <0.1 kU/L
F002-IgE Milk: <0.1 kU/L
F004-IgE Wheat: <0.1 kU/L
F010-IgE Sesame Seed: <0.1 kU/L
F017-IgE Hazelnut (Filbert): <0.1 kU/L
F018-IgE Brazil Nut: <0.1 kU/L
F020-IgE Almond: <0.1 kU/L
F202-IgE Cashew Nut: <0.1 kU/L
F256-IgE Walnut: <0.1 kU/L
F416-IgE Tri a 19(w-5 gliadin): <0.1 kU/L
Peanut, IgE: <0.1 kU/L
Scallop IgE: <0.1 kU/L
Shrimp IgE: <0.1 kU/L
Soybean IgE: <0.1 kU/L
Tuna: <0.1 kU/L

## 2024-01-04 LAB — DIPHTHERIA / TETANUS ANTIBODY PANEL
Diphtheria Ab: 0.56 [IU]/mL (ref <0.10)
Tetanus Ab, IgG: 2.57 [IU]/mL (ref <0.10)

## 2024-01-04 LAB — ALLERGEN, MANGO, F91: Mango IgE: <0.1 kU/L

## 2024-01-04 LAB — COMPLEMENT, TOTAL: Compl, Total (CH50): >60 U/mL (ref >41)

## 2024-01-04 LAB — ALLERGEN, PINEAPPLE, F210: Pineapple IgE: <0.1 kU/L

## 2024-01-04 LAB — ALLERGEN BANANA: Allergen Banana IgE: <0.1 kU/L

## 2024-01-04 LAB — IGG, IGA, IGM
IgG (Immunoglobin G), Serum: 1546 mg/dL (ref 586–1602)
IgM (Immunoglobulin M), Srm: 182 mg/dL (ref 26–217)
Immunoglobulin A, (IgA) QN, Serum: 241 mg/dL (ref 87–352)

## 2024-01-04 NOTE — Progress Notes (Signed)
 Can you please call this patient and let her know that the food testing was all negative. She can come in for skin testing to be sure, however, I suspect oral allergy  syndrome related to pollen allergy . Please have her avoid foods that bother her mouth.    Your immune workup was not normal, but we need to do further workup to figure you out. We first looked at immunoglobulins.  Immunoglobulins are proteins that bind to and neutralize bacteria and viruses. Your immunoglobulin levels were normal.  Next we checked your specific immunoglobulins to routine vaccinations.  You were protective against diphtheria. You were not protective against tetanus.  We also looked at protection against a bacteria called Streptococcus pneumonia.  This is a bacteria that causes sinus infections, ear infections, and pneumonia.  You were protective to 5 out of 23 strains of Streptococcus pneumonia which is a poor response.  We also looked at complement activity.  Complement as a protein made by your liver which helps your immune system to work more efficiently.  This activity was normal. At this time, would recommend a PPSV23 or Prevnar 20 vaccine and we will recheck the pneumococcal titers in about 4-6 weeks after receiving the vaccine. Thank you

## 2024-01-05 NOTE — Progress Notes (Unsigned)
 Chief Complaint: Follow-up diarrhea  HPI:    Amanda Wilson is a 33 year old female, assigned to Dr. Avram, with a past medical history as listed below including ADHD and multiple others, who was referred to me by Lendia Boby CROME, NP-C for follow-up of diarrhea.    12/18/2022 right upper quadrant ultrasound with extensive cholelithiasis.    01/11/2023 cholecystectomy.    02/09/2023 ferritin low at 5.7.  Recheck 06/10/2023 ferritin 10.0.    06/13/2023 GI pathogen panel showed norovirus.  CBC with MCV low at 75.1.  Hemoglobin normal.  CMP normal.  Hemoglobin A1c normal.  TSH and free T4 normal.  Vitamin D  low at 9.71.    08/09/2023 patient seen in clinic with a constellation of GI issues that been going on for years off-and-on.  At that time discussed generalized abdominal pain, hematochezia, history of anal fissure, nausea, status post cholecystectomy, weight loss, vitamin D /B12/iron deficiency and diarrhea.  At that time scheduled for an EGD and colonoscopy at the hospital given a BMI greater than 50.  Also proceed with the labs.  She was referred to hematology for Feraheme infusions given persistently low ferritin.  Given Hyoscyamine.    09/08/2023 CTAP with contrast done for abdominal pain with no definite abnormality.    09/29/23 colonoscopy with examined portion of the ileum normal, internal hemorrhoids and otherwise normal.  At that time discussed that diarrhea was likely IBS with anorectal bleeding.  Pathology showed no specific pathologic diagnosis.  She was prescribed colestipol  to help with diarrhea recommended taking midday and at least 4 hours from other meds especially her OCP.    09/29/23 EGD with erythematous mucosa in the antrum and otherwise normal.  Pathology showed gastric mucosa with no specific pathologic diagnosis.    11/02/2023 office visit with me and at that time continued frustration with ongoing symptoms of diarrhea sometimes 8-10 times a day.  Also abdominal discomfort and bloody  mucus in her stool.  Symptoms inhibiting sleep.  Past Medical History:  Diagnosis Date   ADHD (attention deficit hyperactivity disorder)    Allergy     Anal fissure    Anemia    Anxiety    Complication of anesthesia    said nerve block didn't work on finger   COVID-19    early February   Depression    Family history of adverse reaction to anesthesia    Father- slow to awaken also- father had part of lung remved for an Empyema.   Frequent headaches    migraines- maybe 1 once a month since starting to take Topmax on a daily basis   Gallstones    Neuromuscular disorder (HCC)    nerve pain in right small finger after surgery   Obesity    Pneumonia    as a  child    Past Surgical History:  Procedure Laterality Date   CHOLECYSTECTOMY N/A 01/11/2023   Procedure: LAPAROSCOPIC CHOLECYSTECTOMY WITH INTRAOPERATIVE CHOLANGIOGRAM;  Surgeon: Belinda Cough, MD;  Location: [facility] OR;  Service: General;  Laterality: N/A;  RNFA Assist   COLONOSCOPY N/A 09/29/2023   Procedure: COLONOSCOPY;  Surgeon: Avram Lupita BRAVO, MD;  Location: [facility];  Service: Gastroenterology;  Laterality: N/A;   ESOPHAGOGASTRODUODENOSCOPY N/A 09/29/2023   Procedure: EGD (ESOPHAGOGASTRODUODENOSCOPY);  Surgeon: Avram Lupita BRAVO, MD;  Location: [facility];  Service: Gastroenterology;  Laterality: N/A;   HARDWARE REMOVAL Right 05/05/2020   Procedure: RIGHT HAND HARDWARE REMOVAL AND SMALL FINGER METACARPOPHALANGEAL JOINT RELEASE;  Surgeon: Sebastian Lenis, MD;  Location: [facility] OR;  Service: Orthopedics;  Laterality: Right;   OPEN REDUCTION INTERNAL FIXATION (ORIF) PROXIMAL PHALANX Right 11/05/2019   Procedure: OPEN TREATMENT OF RIGHT RING FINGER FRACTURE AND 5TH METACARPAL FRACTURE;  Surgeon: Sebastian Lenis, MD;  Location: [facility] OR;  Service: Orthopedics;  Laterality: Right;  LENGTH OF SURGERY: 90 MIN  PRE OP BLOCK    Current Outpatient Medications  Medication Sig Dispense Refill   acetic acid -hydrocortisone  (VOSOL -HC) OTIC  solution Place 3 drops into the left ear 3 (three) times daily. 10 mL 1   amphetamine -dextroamphetamine  (ADDERALL XR) 30 MG 24 hr capsule Take 30 mg by mouth every morning.     cloNIDine (CATAPRES) 0.1 MG tablet Take 0.1 mg by mouth at bedtime.     colestipol  (COLESTID ) 1 g tablet Take 2 tablets (2 g total) by mouth daily. Take with lunch (mid-day so as not to bind other medications) 60 tablet 3   Dermatological Products, Misc. (DERMAREST ROSACEA EX) Apply 1 Application topically in the morning and at bedtime. Triple rosacea cream (azelaic acid/ivermectin/metronidazole)     dicyclomine  (BENTYL ) 20 MG tablet Take 1 tablet (20 mg total) by mouth 3 (three) times daily before meals. 90 tablet 5   drospirenone-ethinyl estradiol  (YAZ) 3-0.02 MG tablet Take 1 tablet by mouth daily.     Ferrous Sulfate (IRON PO) Take by mouth.     FLUoxetine  (PROZAC ) 40 MG capsule Take 40 mg by mouth every morning.     hydrocortisone  (ANUSOL -HC) 25 MG suppository Place 1 suppository (25 mg total) rectally 2 (two) times daily. 14 suppository 1   MELATONIN PO Take 5 mg by mouth at bedtime.     montelukast  (SINGULAIR ) 10 MG tablet Take 1 tablet (10 mg total) by mouth at bedtime. 30 tablet 5   NYSTATIN powder Apply topically as needed.     Olopatadine-Mometasone (RYALTRIS ) 665-25 MCG/ACT SUSP Place 2 sprays into the nose 2 (two) times daily as needed. 29 g 5   ondansetron  (ZOFRAN -ODT) 4 MG disintegrating tablet Take 1 tablet (4 mg total) by mouth every 6 (six) hours as needed for nausea or vomiting. 20 tablet 0   polyethylene glycol (MIRALAX / GLYCOLAX) 17 g packet Take 17 g by mouth in the morning. (Patient taking differently: Take 17 g by mouth daily as needed for mild constipation.)     triamcinolone  (KENALOG ) 0.025 % ointment Apply 1 Application topically 2 (two) times daily. 30 g 3   Vitamin D , Ergocalciferol , (DRISDOL ) 1.25 MG (50000 UNIT) CAPS capsule Take 1 capsule (50,000 Units total) by mouth every 7 (seven) days. 12  capsule 0   No current facility-administered medications for this visit.    Allergies as of 01/10/2024 - Review Complete 12/30/2023  Allergen Reaction Noted   Duricef [cefadroxil]  05/30/2016   Latex Rash 05/30/2016    Family History  Problem Relation Age of Onset   Allergic rhinitis Mother    Cancer Mother        skin   Depression Mother    Anxiety disorder Mother    Arthritis Mother    Allergic rhinitis Father    Heart disease Father    Stroke Father    Diabetes Father    Atrial fibrillation Father    Allergic rhinitis Sister    Depression Sister    Anxiety disorder Sister    Other Sister        some form of celiac   Urticaria Brother    Eczema Brother    Asthma Brother    Allergic rhinitis Brother  Anxiety disorder Brother    Dementia Maternal Grandfather    Atrial fibrillation Paternal Grandfather    Depression Maternal Aunt    Anxiety disorder Maternal Aunt    Depression Cousin    Angioedema Neg Hx     Social History   Socioeconomic History   Marital status: Single    Spouse name: Not on file   Number of children: 0   Years of education: Not on file   Highest education level: Master's degree (e.g., MA, MS, MEng, MEd, MSW, MBA)  Occupational History   Not on file  Tobacco Use   Smoking status: Never    Passive exposure: Never   Smokeless tobacco: Never  Vaping Use   Vaping status: Never Used  Substance and Sexual Activity   Alcohol use: Not Currently   Drug use: Never   Sexual activity: Not Currently    Partners: Male    Comment: Nuvaring  Other Topics Concern   Not on file  Social History Narrative   Not on file   Social Drivers of Health   Tobacco Use: Low Risk (12/30/2023)   Patient History    Smoking Tobacco Use: Never    Smokeless Tobacco Use: Never    Passive Exposure: Never  Financial Resource Strain: Low Risk (12/19/2023)   Overall Financial Resource Strain (CARDIA)    Difficulty of Paying Living Expenses: Not very hard  Food  Insecurity: No Food Insecurity (12/19/2023)   Epic    Worried About Programme Researcher, Broadcasting/film/video in the Last Year: Never true    Ran Out of Food in the Last Year: Never true  Transportation Needs: No Transportation Needs (12/19/2023)   Epic    Lack of Transportation (Medical): No    Lack of Transportation (Non-Medical): No  Physical Activity: Inactive (12/19/2023)   Exercise Vital Sign    Days of Exercise per Week: 0 days    Minutes of Exercise per Session: Not on file  Stress: Stress Concern Present (12/19/2023)   [employer] of Occupational Health - Occupational Stress Questionnaire    Feeling of Stress: Very much  Social Connections: Socially Isolated (12/19/2023)   Social Connection and Isolation Panel    Frequency of Communication with Friends and Family: More than three times a week    Frequency of Social Gatherings with Friends and Family: Once a week    Attends Religious Services: Never    Database Administrator or Organizations: No    Attends Engineer, Structural: Not on file    Marital Status: Never married  Intimate Partner Violence: Not At Risk (09/20/2023)   Epic    Fear of Current or Ex-Partner: No    Emotionally Abused: No    Physically Abused: No    Sexually Abused: No  Depression (PHQ2-9): Low Risk (12/20/2023)   Depression (PHQ2-9)    PHQ-2 Score: 0  Alcohol Screen: Low Risk (12/19/2023)   Alcohol Screen    Last Alcohol Screening Score (AUDIT): 1  Housing: Low Risk (12/19/2023)   Epic    Unable to Pay for Housing in the Last Year: No    Number of Times Moved in the Last Year: 0    Homeless in the Last Year: No  Utilities: Not At Risk (09/20/2023)   Epic    Threatened with loss of utilities: No  Health Literacy: Not on file    Review of Systems:    Constitutional: No weight loss, fever, chills, weakness or fatigue HEENT: Eyes: No change in  vision               Ears, Nose, Throat:  No change in hearing or congestion Skin: No rash or  itching Cardiovascular: No chest pain, chest pressure or palpitations   Respiratory: No SOB or cough Gastrointestinal: See HPI and otherwise negative Genitourinary: No dysuria or change in urinary frequency Neurological: No headache, dizziness or syncope Musculoskeletal: No new muscle or joint pain Hematologic: No bleeding or bruising Psychiatric: No history of depression or anxiety    Physical Exam:  Vital signs: There were no vitals taken for this visit.  Constitutional:   Pleasant Caucasian female appears to be in NAD, Well developed, Well nourished, alert and cooperative Head:  Normocephalic and atraumatic. Eyes:   PEERL, EOMI. No icterus. Conjunctiva pink. Ears:  Normal auditory acuity. Neck:  Supple Throat: Oral cavity and pharynx without inflammation, swelling or lesion.  Respiratory: Respirations even and unlabored. Lungs clear to auscultation bilaterally.   No wheezes, crackles, or rhonchi.  Cardiovascular: Normal S1, S2. No MRG. Regular rate and rhythm. No peripheral edema, cyanosis or pallor.  Gastrointestinal:  Soft, nondistended, nontender. No rebound or guarding. Normal bowel sounds. No appreciable masses or hepatomegaly. Rectal:  Not performed.  Msk:  Symmetrical without gross deformities. Without edema, no deformity or joint abnormality.  Neurologic:  Alert and  oriented x4;  grossly normal neurologically.  Skin:   Dry and intact without significant lesions or rashes. Psychiatric: Oriented to person, place and time. Demonstrates good judgement and reason without abnormal affect or behaviors.  RELEVANT LABS AND IMAGING: CBC    Component Value Date/Time   WBC 11.9 (H) 12/14/2023 0932   WBC 8.9 09/08/2023 1538   RBC 5.06 12/14/2023 0932   HGB 14.2 12/14/2023 0932   HGB 11.3 02/09/2018 1015   HCT 43.2 12/14/2023 0932   HCT 35.9 02/09/2018 1015   PLT 328 12/14/2023 0932   PLT 288 02/09/2018 1015   MCV 85.4 12/14/2023 0932   MCV 77 (L) 02/09/2018 1015   MCH 28.1  12/14/2023 0932   MCHC 32.9 12/14/2023 0932   RDW 14.6 12/14/2023 0932   RDW 14.3 02/09/2018 1015   LYMPHSABS 2.0 12/14/2023 0932   LYMPHSABS 2.2 02/09/2018 1015   MONOABS 0.5 12/14/2023 0932   EOSABS 0.1 12/14/2023 0932   EOSABS 0.1 02/09/2018 1015   BASOSABS 0.1 12/14/2023 0932   BASOSABS 0.0 02/09/2018 1015    CMP     Component Value Date/Time   NA 138 09/08/2023 1538   NA 139 02/09/2018 1015   K 3.9 09/08/2023 1538   CL 101 09/08/2023 1538   CO2 24 09/08/2023 1538   GLUCOSE 100 (H) 09/08/2023 1538   BUN 5 (L) 09/08/2023 1538   BUN 9 02/09/2018 1015   CREATININE 0.55 09/08/2023 1538   CALCIUM 9.2 09/08/2023 1538   PROT 7.7 09/08/2023 1538   PROT 7.0 02/09/2018 1015   ALBUMIN 3.1 (L) 09/08/2023 1538   ALBUMIN 3.7 02/09/2018 1015   AST 12 (L) 09/08/2023 1538   ALT 9 09/08/2023 1538   ALKPHOS 68 09/08/2023 1538   BILITOT 0.4 09/08/2023 1538   BILITOT 0.4 02/09/2018 1015   GFRNONAA >60 09/08/2023 1538   GFRAA >60 10/28/2019 2247    Assessment: 1.  Abdominal pain with diarrhea: Chronic GI symptoms for at least the past year of her life, previously discussed IBS with other providers, recently underwent EGD and colonoscopy without other cause, tried for low FODMAP diet in the past with no help, unable  to get Hyoscyamine, did not start colestipol , though diarrhea outdated her cholecystectomy in May 2.  Status postcholecystectomy: Gallbladder out in December 3.  Rectal bleeding: Hemorrhoids on colonoscopy recently thought to be the source irritated by chronic diarrhea 4.  Iron deficiency: Following with hematology and received an iron infusion, recent EGD and colonoscopy with no cause, on continuous birth control with no menstrual cycle either so this is not contributing, recommended continued follow-up with hematology  Plan: 1.  Discussed the care app for IBS with the patient. 2.  Prescribe Dicyclomine  20 mg 2030 minutes before meals 4 times a day 3.  Recommended starting  Colestipol  1 g twice a day. 4.  Discussed following up with endocrinology at last visit 5.  Continue to follow with hematology regards to iron infusions 6.  Prescribed Hydrocortisone  suppositories twice daily for 7 days at last visit 7.  Ordered repeat GI path panel last visit which was never completed.      Delon Failing, PA-C [facility] 01/05/2024, 10:25 AM  Cc: Henson, Vickie L, NP-C

## 2024-01-06 DIAGNOSIS — F909 Attention-deficit hyperactivity disorder, unspecified type: Secondary | ICD-10-CM | POA: Diagnosis not present

## 2024-01-06 DIAGNOSIS — F330 Major depressive disorder, recurrent, mild: Secondary | ICD-10-CM | POA: Diagnosis not present

## 2024-01-06 DIAGNOSIS — F419 Anxiety disorder, unspecified: Secondary | ICD-10-CM | POA: Diagnosis not present

## 2024-01-06 DIAGNOSIS — Z5181 Encounter for therapeutic drug level monitoring: Secondary | ICD-10-CM | POA: Diagnosis not present

## 2024-01-09 ENCOUNTER — Ambulatory Visit: Admitting: Physician Assistant

## 2024-01-09 DIAGNOSIS — F413 Other mixed anxiety disorders: Secondary | ICD-10-CM | POA: Diagnosis not present

## 2024-01-10 ENCOUNTER — Ambulatory Visit: Admitting: Physician Assistant

## 2024-01-10 ENCOUNTER — Encounter: Payer: Self-pay | Admitting: Physician Assistant

## 2024-01-10 VITALS — BP 130/72 | HR 109 | Ht 64.0 in | Wt 308.0 lb

## 2024-01-10 DIAGNOSIS — D509 Iron deficiency anemia, unspecified: Secondary | ICD-10-CM

## 2024-01-10 DIAGNOSIS — K649 Unspecified hemorrhoids: Secondary | ICD-10-CM

## 2024-01-10 DIAGNOSIS — Z9049 Acquired absence of other specified parts of digestive tract: Secondary | ICD-10-CM | POA: Diagnosis not present

## 2024-01-10 DIAGNOSIS — K625 Hemorrhage of anus and rectum: Secondary | ICD-10-CM | POA: Diagnosis not present

## 2024-01-10 DIAGNOSIS — E611 Iron deficiency: Secondary | ICD-10-CM | POA: Diagnosis not present

## 2024-01-10 DIAGNOSIS — R197 Diarrhea, unspecified: Secondary | ICD-10-CM

## 2024-01-10 DIAGNOSIS — K580 Irritable bowel syndrome with diarrhea: Secondary | ICD-10-CM

## 2024-01-10 MED ORDER — COLESTIPOL HCL 1 G PO TABS: 2.0000 g | ORAL_TABLET | Freq: Two times a day (BID) | ORAL | 3 refills | Status: AC

## 2024-01-10 MED ORDER — HYDROCORTISONE (PERIANAL) 2.5 % EX CREA: 1.0000 | TOPICAL_CREAM | Freq: Every evening | CUTANEOUS | 1 refills | Status: AC

## 2024-01-10 NOTE — Patient Instructions (Addendum)
 We have sent the following medications to your pharmacy for you to pick up at your convenience: Colestipol  2 gram twice daily. Hydrocortisone  2.5 % cream - Cover Preparation H suppository daily for 14 days.   Use Dicyclomine  every 4 hours as needed.   Referral placed to dietician.

## 2024-01-11 ENCOUNTER — Inpatient Hospital Stay: Attending: Nurse Practitioner

## 2024-01-11 DIAGNOSIS — N920 Excessive and frequent menstruation with regular cycle: Secondary | ICD-10-CM | POA: Diagnosis present

## 2024-01-11 DIAGNOSIS — D500 Iron deficiency anemia secondary to blood loss (chronic): Secondary | ICD-10-CM | POA: Insufficient documentation

## 2024-01-11 DIAGNOSIS — K921 Melena: Secondary | ICD-10-CM

## 2024-01-11 DIAGNOSIS — D649 Anemia, unspecified: Secondary | ICD-10-CM

## 2024-01-11 DIAGNOSIS — R233 Spontaneous ecchymoses: Secondary | ICD-10-CM

## 2024-01-11 LAB — IRON AND IRON BINDING CAPACITY (CC-WL,HP ONLY)
Iron: 72 ug/dL (ref 28–170)
Saturation Ratios: 19 % (ref 10.4–31.8)
TIBC: 371 ug/dL (ref 250–450)
UIBC: 299 ug/dL

## 2024-01-11 LAB — CBC WITH DIFFERENTIAL (CANCER CENTER ONLY)
Abs Immature Granulocytes: 0.04 K/uL (ref 0.00–0.07)
Basophils Absolute: 0.1 K/uL (ref 0.0–0.1)
Basophils Relative: 1 %
Eosinophils Absolute: 0.1 K/uL (ref 0.0–0.5)
Eosinophils Relative: 1 %
HCT: 39.9 % (ref 36.0–46.0)
Hemoglobin: 13.1 g/dL (ref 12.0–15.0)
Immature Granulocytes: 0 %
Lymphocytes Relative: 23 %
Lymphs Abs: 2.3 K/uL (ref 0.7–4.0)
MCH: 28.1 pg (ref 26.0–34.0)
MCHC: 32.8 g/dL (ref 30.0–36.0)
MCV: 85.4 fL (ref 80.0–100.0)
Monocytes Absolute: 0.4 K/uL (ref 0.1–1.0)
Monocytes Relative: 4 %
Neutro Abs: 7.2 K/uL (ref 1.7–7.7)
Neutrophils Relative %: 71 %
Platelet Count: 294 K/uL (ref 150–400)
RBC: 4.67 MIL/uL (ref 3.87–5.11)
RDW: 13.4 % (ref 11.5–15.5)
WBC Count: 10.1 K/uL (ref 4.0–10.5)
nRBC: 0 % (ref 0.0–0.2)

## 2024-01-11 LAB — PROTIME-INR
INR: 1 (ref 0.8–1.2)
Prothrombin Time: 13.4 s (ref 11.4–15.2)

## 2024-01-11 LAB — FIBRINOGEN: Fibrinogen: 418 mg/dL (ref 210–475)

## 2024-01-11 LAB — VITAMIN B12: Vitamin B-12: 304 pg/mL (ref 180–914)

## 2024-01-11 LAB — SAMPLE TO BLOOD BANK

## 2024-01-11 LAB — SEDIMENTATION RATE: Sed Rate: 17 mm/h (ref 0–22)

## 2024-01-11 LAB — APTT: aPTT: 24 s (ref 24–36)

## 2024-01-11 LAB — FERRITIN: Ferritin: 129 ng/mL (ref 11–307)

## 2024-01-12 LAB — ANA W/REFLEX IF POSITIVE: Anti Nuclear Antibody (ANA): POSITIVE — AB

## 2024-01-12 LAB — ENA+DNA/DS+ANTICH+CENTRO+JO...
Anti JO-1: <0.2 AI (ref 0.0–0.9)
Centromere Ab Screen: <0.2 AI (ref 0.0–0.9)
Chromatin Ab SerPl-aCnc: <0.2 AI (ref 0.0–0.9)
ENA SM Ab Ser-aCnc: <0.2 AI (ref 0.0–0.9)
Ribonucleic Protein: <0.2 AI (ref 0.0–0.9)
SSA (Ro) (ENA) Antibody, IgG: <0.2 AI (ref 0.0–0.9)
SSB (La) (ENA) Antibody, IgG: <0.2 AI (ref 0.0–0.9)
Scleroderma (Scl-70) (ENA) Antibody, IgG: <0.2 AI (ref 0.0–0.9)
ds DNA Ab: 1 [IU]/mL (ref 0–9)

## 2024-01-12 LAB — RHEUMATOID FACTOR: Rheumatoid fact SerPl-aCnc: <10 [IU]/mL (ref <14.0)

## 2024-01-17 ENCOUNTER — Ambulatory Visit: Payer: Self-pay | Admitting: Nurse Practitioner

## 2024-01-23 ENCOUNTER — Encounter: Payer: Self-pay | Admitting: Family Medicine

## 2024-01-23 ENCOUNTER — Other Ambulatory Visit: Payer: Self-pay | Admitting: Nurse Practitioner

## 2024-01-23 DIAGNOSIS — R7689 Other specified abnormal immunological findings in serum: Secondary | ICD-10-CM

## 2024-01-23 DIAGNOSIS — D649 Anemia, unspecified: Secondary | ICD-10-CM

## 2024-01-23 NOTE — Telephone Encounter (Signed)
 Please advise on pneumonia vaccine.   I can let her know she is not due for another tdap

## 2024-01-25 ENCOUNTER — Ambulatory Visit (INDEPENDENT_AMBULATORY_CARE_PROVIDER_SITE_OTHER)

## 2024-01-25 DIAGNOSIS — Z23 Encounter for immunization: Secondary | ICD-10-CM | POA: Diagnosis not present

## 2024-01-25 NOTE — Progress Notes (Signed)
 After obtaining consent, and per orders of [facility] NP-C , injection of Prevnar 20 given by Ronnald SHAUNNA Palms. Patient instructed to report any adverse reaction to me immediately.

## 2024-02-08 ENCOUNTER — Inpatient Hospital Stay: Attending: Nurse Practitioner

## 2024-02-08 DIAGNOSIS — D649 Anemia, unspecified: Secondary | ICD-10-CM

## 2024-02-08 DIAGNOSIS — D500 Iron deficiency anemia secondary to blood loss (chronic): Secondary | ICD-10-CM

## 2024-02-08 LAB — CBC WITH DIFFERENTIAL (CANCER CENTER ONLY)
Abs Immature Granulocytes: 0.03 K/uL (ref 0.00–0.07)
Basophils Absolute: 0.1 K/uL (ref 0.0–0.1)
Basophils Relative: 1 %
Eosinophils Absolute: 0.1 K/uL (ref 0.0–0.5)
Eosinophils Relative: 1 %
HCT: 39.3 % (ref 36.0–46.0)
Hemoglobin: 13 g/dL (ref 12.0–15.0)
Immature Granulocytes: 0 %
Lymphocytes Relative: 23 %
Lymphs Abs: 2.5 K/uL (ref 0.7–4.0)
MCH: 28.3 pg (ref 26.0–34.0)
MCHC: 33.1 g/dL (ref 30.0–36.0)
MCV: 85.4 fL (ref 80.0–100.0)
Monocytes Absolute: 0.6 K/uL (ref 0.1–1.0)
Monocytes Relative: 5 %
Neutro Abs: 7.6 K/uL (ref 1.7–7.7)
Neutrophils Relative %: 70 %
Platelet Count: 321 K/uL (ref 150–400)
RBC: 4.6 MIL/uL (ref 3.87–5.11)
RDW: 12.9 % (ref 11.5–15.5)
WBC Count: 10.8 K/uL — ABNORMAL HIGH (ref 4.0–10.5)
nRBC: 0 % (ref 0.0–0.2)

## 2024-02-08 LAB — IRON AND IRON BINDING CAPACITY (CC-WL,HP ONLY)
Iron: 52 ug/dL (ref 28–170)
Saturation Ratios: 14 % (ref 10.4–31.8)
TIBC: 370 ug/dL (ref 250–450)
UIBC: 318 ug/dL

## 2024-02-08 LAB — FERRITIN: Ferritin: 114 ng/mL (ref 11–307)

## 2024-02-08 LAB — VITAMIN B12: Vitamin B-12: 298 pg/mL (ref 180–914)

## 2024-02-16 ENCOUNTER — Other Ambulatory Visit: Payer: Self-pay | Admitting: Family Medicine

## 2024-02-20 ENCOUNTER — Other Ambulatory Visit: Payer: Self-pay | Admitting: Family Medicine

## 2024-02-27 ENCOUNTER — Ambulatory Visit

## 2024-02-29 NOTE — Progress Notes (Unsigned)
 "  Office Visit Note  Patient: Amanda Wilson             Date of Birth: 24-Aug-1990           MRN: [id number]             PCP: Lendia Boby CROME, NP-C Referring: Hanford Milo BRAVO, NP Visit Date: 03/02/2024 Occupation: Data Unavailable  Subjective:  No chief complaint on file.   History of Present Illness: Amanda Wilson is a 34 y.o. female ***     Activities of Daily Living:  Patient reports morning stiffness for *** {minute/hour:19697}.   Patient {ACTIONS;DENIES/REPORTS:21021675::Denies} nocturnal pain.  Difficulty dressing/grooming: {ACTIONS;DENIES/REPORTS:21021675::Denies} Difficulty climbing stairs: {ACTIONS;DENIES/REPORTS:21021675::Denies} Difficulty getting out of chair: {ACTIONS;DENIES/REPORTS:21021675::Denies} Difficulty using hands for taps, buttons, cutlery, and/or writing: {ACTIONS;DENIES/REPORTS:21021675::Denies}  No Rheumatology ROS completed.   PMFS History:  Patient Active Problem List   Diagnosis Date Noted   Allergy  with anaphylaxis due to food 12/30/2023   Rosacea 12/30/2023   Intrinsic atopic dermatitis 12/30/2023   Bacterial sinusitis 12/20/2023   Acute otalgia, bilateral 12/20/2023   Generalized abdominal pain 09/29/2023   Hematochezia 09/29/2023   Symptomatic anemia 09/20/2023   Cellulitis of nose, external 09/12/2023   Allergic contact dermatitis 08/05/2023   Situational anxiety 06/10/2023   Postprandial abdominal bloating 06/10/2023   Diarrhea 06/10/2023   Nausea 06/10/2023   Seasonal and perennial allergic rhinitis 05/06/2023   Recurrent infections 05/06/2023   Iron deficiency anemia 12/13/2022   Vitamin D  deficiency 12/13/2022   Severe obesity (BMI >= 40) (HCC) 12/13/2022   Menorrhagia with regular cycle 12/13/2022   ADHD 05/11/2022   Dysmenorrhea 06/24/2016   Anxiety and depression 06/24/2016   Hirsutism 07/17/2013   Obesity 02/06/2013   Family history of PCOS 01/30/2013   Hair thinning 01/30/2013   Acne 05/12/2012     Past Medical History:  Diagnosis Date   ADHD (attention deficit hyperactivity disorder)    Allergy     Anal fissure    Anemia    Anxiety    Complication of anesthesia    said nerve block didn't work on finger   COVID-19    early February   Depression    Family history of adverse reaction to anesthesia    Father- slow to awaken also- father had part of lung remved for an Empyema.   Frequent headaches    migraines- maybe 1 once a month since starting to take Topmax on a daily basis   Gallstones    Neuromuscular disorder (HCC)    nerve pain in right small finger after surgery   Obesity    Pneumonia    as a  child    Family History  Problem Relation Age of Onset   Allergic rhinitis Mother    Cancer Mother        skin   Depression Mother    Anxiety disorder Mother    Arthritis Mother    Allergic rhinitis Father    Heart disease Father    Stroke Father    Diabetes Father    Atrial fibrillation Father    Allergic rhinitis Sister    Depression Sister    Anxiety disorder Sister    Other Sister        some form of celiac   Urticaria Brother    Eczema Brother    Asthma Brother    Allergic rhinitis Brother    Anxiety disorder Brother    Dementia Maternal Grandfather    Atrial fibrillation Paternal Grandfather    Depression  Maternal Aunt    Anxiety disorder Maternal Aunt    Depression Cousin    Angioedema Neg Hx    Past Surgical History:  Procedure Laterality Date   CHOLECYSTECTOMY N/A 01/11/2023   Procedure: LAPAROSCOPIC CHOLECYSTECTOMY WITH INTRAOPERATIVE CHOLANGIOGRAM;  Surgeon: Belinda Cough, MD;  Location: [facility] OR;  Service: General;  Laterality: N/A;  RNFA Assist   COLONOSCOPY N/A 09/29/2023   Procedure: COLONOSCOPY;  Surgeon: Avram Lupita BRAVO, MD;  Location: [facility];  Service: Gastroenterology;  Laterality: N/A;   ESOPHAGOGASTRODUODENOSCOPY N/A 09/29/2023   Procedure: EGD (ESOPHAGOGASTRODUODENOSCOPY);  Surgeon: Avram Lupita BRAVO, MD;  Location: [facility];   Service: Gastroenterology;  Laterality: N/A;   HARDWARE REMOVAL Right 05/05/2020   Procedure: RIGHT HAND HARDWARE REMOVAL AND SMALL FINGER METACARPOPHALANGEAL JOINT RELEASE;  Surgeon: Sebastian Lenis, MD;  Location: [facility] OR;  Service: Orthopedics;  Laterality: Right;   OPEN REDUCTION INTERNAL FIXATION (ORIF) PROXIMAL PHALANX Right 11/05/2019   Procedure: OPEN TREATMENT OF RIGHT RING FINGER FRACTURE AND 5TH METACARPAL FRACTURE;  Surgeon: Sebastian Lenis, MD;  Location: [facility] OR;  Service: Orthopedics;  Laterality: Right;  LENGTH OF SURGERY: 90 MIN  PRE OP BLOCK   Social History[1] Social History   Social History Narrative   Not on file     Immunization History  Administered Date(s) Administered   DTaP 05/06/1992, 06/17/1992, 08/11/1992, 09/07/1993, 04/11/1997   HIB (PRP-T) 05/06/1992, 06/17/1992, 08/11/1992, 09/07/1993   HPV Quadrivalent 08/25/2010, 11/11/2010, 03/18/2011   Hepatitis B, ADULT 1992/05/25, 04/07/1992, 11/21/1992   IPV 04/07/1992, 06/17/1992, 08/11/1992, 04/11/1997   MMR 09/07/1993, 09/06/2002   PNEUMOCOCCAL CONJUGATE-20 01/25/2024   Tdap 08/25/2010, 03/01/2023   Varicella 03/20/1998, 08/25/2010     Objective: Vital Signs: There were no vitals taken for this visit.   Physical Exam   Musculoskeletal Exam: ***   Investigation: No additional findings.  Imaging: No results found.  Recent Labs: Lab Results  Component Value Date   WBC 10.8 (H) 02/08/2024   HGB 13.0 02/08/2024   PLT 321 02/08/2024   NA 138 09/08/2023   K 3.9 09/08/2023   CL 101 09/08/2023   CO2 24 09/08/2023   GLUCOSE 100 (H) 09/08/2023   BUN 5 (L) 09/08/2023   CREATININE 0.55 09/08/2023   BILITOT 0.4 09/08/2023   ALKPHOS 68 09/08/2023   AST 12 (L) 09/08/2023   ALT 9 09/08/2023   PROT 7.7 09/08/2023   ALBUMIN 3.1 (L) 09/08/2023   CALCIUM 9.2 09/08/2023   GFRAA >60 10/28/2019    Speciality Comments: No specialty comments available.  Procedures:  No procedures performed Allergies:  Duricef [cefadroxil] and Latex   Assessment / Plan:     Visit Diagnoses:  Assessment & Plan  ***  Follow-Up Instructions: No follow-ups on file.   Lonni LELON Ester, MD  Note - This record has been created using [employer].  Chart creation errors have been sought, but may not always  have been located. Such creation errors do not reflect on  the standard of medical care.     [1]  Social History Tobacco Use   Smoking status: Never    Passive exposure: Never   Smokeless tobacco: Never  Vaping Use   Vaping status: Never Used  Substance Use Topics   Alcohol use: Not Currently   Drug use: Never   "

## 2024-03-01 ENCOUNTER — Ambulatory Visit: Admitting: Family Medicine

## 2024-03-01 DIAGNOSIS — B999 Unspecified infectious disease: Secondary | ICD-10-CM

## 2024-03-01 NOTE — Progress Notes (Unsigned)
" ° °  [address] [place] [address] Dept: [phone]  FOLLOW UP NOTE  Patient ID: Heather Novak, female    DOB: 09-09-1990  Age: 34 y.o. MRN: [id number] Date of Office Visit: 03/01/2024  Assessment  Chief Complaint: No chief complaint on file.  HPI Conchita Truxillo is a 34 year old female who presents to the clinic for a follow up visit. She was last seen in this clinic on 12/29/2023 by Arlean Mutter, FNP for evaluation of allergic rhinitis, food allergy , recurrent infection, roseaca, atopic dermatitis, and allergic contact dermatitis.   She received Prevnar 20 on 01/25/2024  Discussed the use of AI scribe software for clinical note transcription with the patient, who gave verbal consent to proceed.  History of Present Illness      Drug Allergies:  Allergies[1]  Physical Exam: There were no vitals taken for this visit.   Physical Exam  Diagnostics:    Assessment and Plan: 1. Recurrent infections     No orders of the defined types were placed in this encounter.   Patient Instructions          No follow-ups on file.    Thank you for the opportunity to care for this patient.  Please do not hesitate to contact me with questions.  Arlean Mutter, FNP [facility]          [1]  Allergies Allergen Reactions   Duricef [Cefadroxil]     Skin sloughing   Latex Rash   "

## 2024-03-01 NOTE — Patient Instructions (Addendum)
 SABRA

## 2024-03-02 ENCOUNTER — Encounter: Payer: Self-pay | Admitting: Internal Medicine

## 2024-03-02 ENCOUNTER — Ambulatory Visit: Admitting: Internal Medicine

## 2024-03-02 VITALS — BP 130/83 | HR 105 | Temp 99.5°F | Resp 16 | Ht 64.75 in | Wt 310.5 lb

## 2024-03-02 DIAGNOSIS — L719 Rosacea, unspecified: Secondary | ICD-10-CM

## 2024-03-02 DIAGNOSIS — K589 Irritable bowel syndrome without diarrhea: Secondary | ICD-10-CM | POA: Insufficient documentation

## 2024-03-02 DIAGNOSIS — M797 Fibromyalgia: Secondary | ICD-10-CM | POA: Insufficient documentation

## 2024-03-02 DIAGNOSIS — R7689 Other specified abnormal immunological findings in serum: Secondary | ICD-10-CM | POA: Insufficient documentation

## 2024-03-02 DIAGNOSIS — L680 Hirsutism: Secondary | ICD-10-CM

## 2024-03-02 DIAGNOSIS — R946 Abnormal results of thyroid function studies: Secondary | ICD-10-CM | POA: Insufficient documentation

## 2024-03-02 DIAGNOSIS — I7300 Raynaud's syndrome without gangrene: Secondary | ICD-10-CM | POA: Insufficient documentation

## 2024-03-02 DIAGNOSIS — K580 Irritable bowel syndrome with diarrhea: Secondary | ICD-10-CM

## 2024-03-02 DIAGNOSIS — E039 Hypothyroidism, unspecified: Secondary | ICD-10-CM

## 2024-03-02 LAB — PNEUMOCOCCAL AB PNL PPSV23, 23 SEROTYPES

## 2024-03-02 MED ORDER — CYCLOBENZAPRINE HCL 10 MG PO TABS: 10.0000 mg | ORAL_TABLET | Freq: Every evening | ORAL | 0 refills | Status: AC | PRN

## 2024-03-02 NOTE — Assessment & Plan Note (Signed)
 SABRA

## 2024-03-02 NOTE — Assessment & Plan Note (Signed)
 Amanda Wilson

## 2024-03-02 NOTE — Patient Instructions (Signed)
 I recommend checking out the [facility] [place] patient-centered guide for fibromyalgia and chronic pain management: [email or web address]

## 2024-03-02 NOTE — Assessment & Plan Note (Signed)
" °  Orders:   cyclobenzaprine  (FLEXERIL ) 10 MG tablet; Take 1 tablet (10 mg total) by mouth at bedtime as needed.  "

## 2024-03-02 NOTE — Assessment & Plan Note (Signed)
" °  Orders:   Sedimentation rate   C-reactive protein   C3 and C4   Thyroid  Peroxidase Antibodies (TPO) (REFL)   Beta-2 glycoprotein antibodies   Cardiolipin antibodies, IgG, IgM, IgA  "

## 2024-03-06 ENCOUNTER — Inpatient Hospital Stay: Attending: Nurse Practitioner

## 2024-03-07 ENCOUNTER — Inpatient Hospital Stay

## 2024-03-20 ENCOUNTER — Ambulatory Visit: Admitting: Physician Assistant

## 2024-04-02 ENCOUNTER — Ambulatory Visit: Admitting: Family Medicine

## 2024-04-04 ENCOUNTER — Inpatient Hospital Stay

## 2024-04-04 ENCOUNTER — Inpatient Hospital Stay: Attending: Nurse Practitioner | Admitting: Nurse Practitioner
